# Patient Record
Sex: Male | Born: 1935 | Race: White | Hispanic: No | Smoking: Former smoker
Health system: Southern US, Community
[De-identification: ages and names within clinical notes are randomized; demographics above are authoritative.]

## PROBLEM LIST (undated history)

## (undated) DIAGNOSIS — G609 Hereditary and idiopathic neuropathy, unspecified: Secondary | ICD-10-CM

## (undated) DIAGNOSIS — I5022 Chronic systolic (congestive) heart failure: Secondary | ICD-10-CM

## (undated) DIAGNOSIS — I779 Disorder of arteries and arterioles, unspecified: Secondary | ICD-10-CM

## (undated) DIAGNOSIS — R001 Bradycardia, unspecified: Secondary | ICD-10-CM

## (undated) DIAGNOSIS — G629 Polyneuropathy, unspecified: Secondary | ICD-10-CM

## (undated) DIAGNOSIS — I428 Other cardiomyopathies: Secondary | ICD-10-CM

## (undated) DIAGNOSIS — I10 Essential (primary) hypertension: Secondary | ICD-10-CM

## (undated) DIAGNOSIS — E785 Hyperlipidemia, unspecified: Secondary | ICD-10-CM

## (undated) DIAGNOSIS — N183 Chronic kidney disease, stage 3 unspecified: Secondary | ICD-10-CM

## (undated) DIAGNOSIS — I4819 Other persistent atrial fibrillation: Secondary | ICD-10-CM

## (undated) DIAGNOSIS — I2510 Atherosclerotic heart disease of native coronary artery without angina pectoris: Secondary | ICD-10-CM

## (undated) DIAGNOSIS — Z8619 Personal history of other infectious and parasitic diseases: Secondary | ICD-10-CM

## (undated) HISTORY — PX: OTHER SURGICAL HISTORY: SHX169

## (undated) HISTORY — DX: Personal history of other infectious and parasitic diseases: Z86.19

## (undated) HISTORY — DX: Polyneuropathy, unspecified: G62.9

## (undated) HISTORY — DX: Other persistent atrial fibrillation: I48.19

## (undated) HISTORY — DX: Chronic systolic (congestive) heart failure: I50.22

## (undated) HISTORY — DX: Bradycardia, unspecified: R00.1

## (undated) HISTORY — DX: Other cardiomyopathies: I42.8

## (undated) HISTORY — DX: Essential (primary) hypertension: I10

## (undated) HISTORY — DX: Chronic kidney disease, stage 3 unspecified: N18.30

## (undated) HISTORY — PX: CATARACT EXTRACTION: SUR2

## (undated) HISTORY — PX: CORONARY ARTERY BYPASS GRAFT: SHX141

## (undated) HISTORY — DX: Hereditary and idiopathic neuropathy, unspecified: G60.9

## (undated) HISTORY — DX: Disorder of arteries and arterioles, unspecified: I77.9

## (undated) HISTORY — DX: Atherosclerotic heart disease of native coronary artery without angina pectoris: I25.10

## (undated) HISTORY — DX: Hyperlipidemia, unspecified: E78.5

---

## 1999-09-15 HISTORY — PX: CARDIAC CATHETERIZATION: SHX172

## 1999-10-10 ENCOUNTER — Encounter: Payer: Self-pay | Admitting: Emergency Medicine

## 1999-10-10 ENCOUNTER — Inpatient Hospital Stay (HOSPITAL_COMMUNITY): Admission: EM | Admit: 1999-10-10 | Discharge: 1999-10-17 | Payer: Self-pay | Admitting: Emergency Medicine

## 1999-10-11 ENCOUNTER — Encounter: Payer: Self-pay | Admitting: Cardiology

## 1999-10-12 ENCOUNTER — Encounter: Payer: Self-pay | Admitting: Thoracic Surgery (Cardiothoracic Vascular Surgery)

## 1999-10-13 ENCOUNTER — Encounter: Payer: Self-pay | Admitting: Thoracic Surgery (Cardiothoracic Vascular Surgery)

## 1999-10-14 ENCOUNTER — Encounter: Payer: Self-pay | Admitting: Thoracic Surgery (Cardiothoracic Vascular Surgery)

## 1999-10-14 ENCOUNTER — Encounter: Payer: Self-pay | Admitting: Cardiology

## 1999-10-15 ENCOUNTER — Encounter: Payer: Self-pay | Admitting: Thoracic Surgery (Cardiothoracic Vascular Surgery)

## 2012-01-01 DIAGNOSIS — F411 Generalized anxiety disorder: Secondary | ICD-10-CM | POA: Diagnosis not present

## 2012-01-01 DIAGNOSIS — E782 Mixed hyperlipidemia: Secondary | ICD-10-CM | POA: Diagnosis not present

## 2012-01-01 DIAGNOSIS — M549 Dorsalgia, unspecified: Secondary | ICD-10-CM | POA: Diagnosis not present

## 2012-01-01 DIAGNOSIS — R5382 Chronic fatigue, unspecified: Secondary | ICD-10-CM | POA: Diagnosis not present

## 2012-01-02 ENCOUNTER — Other Ambulatory Visit: Payer: Self-pay | Admitting: Cardiology

## 2012-02-17 ENCOUNTER — Other Ambulatory Visit: Payer: Self-pay | Admitting: Cardiology

## 2012-04-02 DIAGNOSIS — I10 Essential (primary) hypertension: Secondary | ICD-10-CM | POA: Diagnosis not present

## 2012-04-02 DIAGNOSIS — M549 Dorsalgia, unspecified: Secondary | ICD-10-CM | POA: Diagnosis not present

## 2012-05-11 DIAGNOSIS — I2510 Atherosclerotic heart disease of native coronary artery without angina pectoris: Secondary | ICD-10-CM | POA: Diagnosis not present

## 2012-05-11 DIAGNOSIS — I10 Essential (primary) hypertension: Secondary | ICD-10-CM | POA: Diagnosis not present

## 2012-05-11 DIAGNOSIS — I495 Sick sinus syndrome: Secondary | ICD-10-CM | POA: Diagnosis not present

## 2012-05-11 DIAGNOSIS — E785 Hyperlipidemia, unspecified: Secondary | ICD-10-CM | POA: Diagnosis not present

## 2012-05-14 ENCOUNTER — Other Ambulatory Visit: Payer: Self-pay | Admitting: Cardiology

## 2012-05-28 ENCOUNTER — Other Ambulatory Visit: Payer: Self-pay | Admitting: Cardiology

## 2012-06-22 DIAGNOSIS — I2510 Atherosclerotic heart disease of native coronary artery without angina pectoris: Secondary | ICD-10-CM | POA: Diagnosis not present

## 2012-06-22 DIAGNOSIS — I495 Sick sinus syndrome: Secondary | ICD-10-CM | POA: Diagnosis not present

## 2012-06-22 DIAGNOSIS — E785 Hyperlipidemia, unspecified: Secondary | ICD-10-CM | POA: Diagnosis not present

## 2012-06-22 DIAGNOSIS — I10 Essential (primary) hypertension: Secondary | ICD-10-CM | POA: Diagnosis not present

## 2012-07-05 DIAGNOSIS — I10 Essential (primary) hypertension: Secondary | ICD-10-CM | POA: Diagnosis not present

## 2012-10-05 DIAGNOSIS — I10 Essential (primary) hypertension: Secondary | ICD-10-CM | POA: Diagnosis not present

## 2012-11-26 DIAGNOSIS — H04129 Dry eye syndrome of unspecified lacrimal gland: Secondary | ICD-10-CM | POA: Diagnosis not present

## 2012-11-26 DIAGNOSIS — H538 Other visual disturbances: Secondary | ICD-10-CM | POA: Diagnosis not present

## 2012-11-26 DIAGNOSIS — H26499 Other secondary cataract, unspecified eye: Secondary | ICD-10-CM | POA: Diagnosis not present

## 2012-11-26 DIAGNOSIS — H40019 Open angle with borderline findings, low risk, unspecified eye: Secondary | ICD-10-CM | POA: Diagnosis not present

## 2012-11-26 DIAGNOSIS — H353190 Nonexudative age-related macular degeneration, unspecified eye, stage unspecified: Secondary | ICD-10-CM | POA: Diagnosis not present

## 2013-01-04 DIAGNOSIS — I10 Essential (primary) hypertension: Secondary | ICD-10-CM | POA: Diagnosis not present

## 2013-01-07 DIAGNOSIS — H26499 Other secondary cataract, unspecified eye: Secondary | ICD-10-CM | POA: Diagnosis not present

## 2013-01-07 DIAGNOSIS — H538 Other visual disturbances: Secondary | ICD-10-CM | POA: Diagnosis not present

## 2013-02-02 DIAGNOSIS — H16229 Keratoconjunctivitis sicca, not specified as Sjogren's, unspecified eye: Secondary | ICD-10-CM | POA: Diagnosis not present

## 2013-04-04 DIAGNOSIS — Z125 Encounter for screening for malignant neoplasm of prostate: Secondary | ICD-10-CM | POA: Diagnosis not present

## 2013-04-04 DIAGNOSIS — I10 Essential (primary) hypertension: Secondary | ICD-10-CM | POA: Diagnosis not present

## 2013-04-04 DIAGNOSIS — Z0000 Encounter for general adult medical examination without abnormal findings: Secondary | ICD-10-CM | POA: Diagnosis not present

## 2013-05-26 DIAGNOSIS — H04129 Dry eye syndrome of unspecified lacrimal gland: Secondary | ICD-10-CM | POA: Diagnosis not present

## 2013-05-26 DIAGNOSIS — H40059 Ocular hypertension, unspecified eye: Secondary | ICD-10-CM | POA: Diagnosis not present

## 2013-05-26 DIAGNOSIS — H01009 Unspecified blepharitis unspecified eye, unspecified eyelid: Secondary | ICD-10-CM | POA: Diagnosis not present

## 2013-05-26 DIAGNOSIS — H40019 Open angle with borderline findings, low risk, unspecified eye: Secondary | ICD-10-CM | POA: Diagnosis not present

## 2013-06-14 DIAGNOSIS — H04129 Dry eye syndrome of unspecified lacrimal gland: Secondary | ICD-10-CM | POA: Diagnosis not present

## 2013-06-14 DIAGNOSIS — H26499 Other secondary cataract, unspecified eye: Secondary | ICD-10-CM | POA: Diagnosis not present

## 2013-06-14 DIAGNOSIS — Z961 Presence of intraocular lens: Secondary | ICD-10-CM | POA: Diagnosis not present

## 2013-06-14 DIAGNOSIS — H353190 Nonexudative age-related macular degeneration, unspecified eye, stage unspecified: Secondary | ICD-10-CM | POA: Diagnosis not present

## 2013-06-14 DIAGNOSIS — H40019 Open angle with borderline findings, low risk, unspecified eye: Secondary | ICD-10-CM | POA: Diagnosis not present

## 2013-06-22 DIAGNOSIS — I495 Sick sinus syndrome: Secondary | ICD-10-CM | POA: Diagnosis not present

## 2013-06-22 DIAGNOSIS — I2510 Atherosclerotic heart disease of native coronary artery without angina pectoris: Secondary | ICD-10-CM | POA: Diagnosis not present

## 2013-06-22 DIAGNOSIS — E785 Hyperlipidemia, unspecified: Secondary | ICD-10-CM | POA: Diagnosis not present

## 2013-06-22 DIAGNOSIS — I10 Essential (primary) hypertension: Secondary | ICD-10-CM | POA: Diagnosis not present

## 2013-07-04 DIAGNOSIS — M549 Dorsalgia, unspecified: Secondary | ICD-10-CM | POA: Diagnosis not present

## 2013-10-03 DIAGNOSIS — M549 Dorsalgia, unspecified: Secondary | ICD-10-CM | POA: Diagnosis not present

## 2013-12-26 DIAGNOSIS — H26499 Other secondary cataract, unspecified eye: Secondary | ICD-10-CM | POA: Diagnosis not present

## 2013-12-26 DIAGNOSIS — H04129 Dry eye syndrome of unspecified lacrimal gland: Secondary | ICD-10-CM | POA: Diagnosis not present

## 2013-12-26 DIAGNOSIS — H40019 Open angle with borderline findings, low risk, unspecified eye: Secondary | ICD-10-CM | POA: Diagnosis not present

## 2013-12-26 DIAGNOSIS — H01009 Unspecified blepharitis unspecified eye, unspecified eyelid: Secondary | ICD-10-CM | POA: Diagnosis not present

## 2013-12-26 DIAGNOSIS — H43819 Vitreous degeneration, unspecified eye: Secondary | ICD-10-CM | POA: Diagnosis not present

## 2013-12-26 DIAGNOSIS — D492 Neoplasm of unspecified behavior of bone, soft tissue, and skin: Secondary | ICD-10-CM | POA: Diagnosis not present

## 2013-12-26 DIAGNOSIS — Z961 Presence of intraocular lens: Secondary | ICD-10-CM | POA: Diagnosis not present

## 2013-12-26 DIAGNOSIS — H179 Unspecified corneal scar and opacity: Secondary | ICD-10-CM | POA: Diagnosis not present

## 2014-01-03 DIAGNOSIS — E782 Mixed hyperlipidemia: Secondary | ICD-10-CM | POA: Diagnosis not present

## 2014-01-03 DIAGNOSIS — I10 Essential (primary) hypertension: Secondary | ICD-10-CM | POA: Diagnosis not present

## 2014-04-03 DIAGNOSIS — E782 Mixed hyperlipidemia: Secondary | ICD-10-CM | POA: Diagnosis not present

## 2014-04-03 DIAGNOSIS — I10 Essential (primary) hypertension: Secondary | ICD-10-CM | POA: Diagnosis not present

## 2014-05-11 DIAGNOSIS — L570 Actinic keratosis: Secondary | ICD-10-CM | POA: Diagnosis not present

## 2014-06-13 DIAGNOSIS — H04209 Unspecified epiphora, unspecified lacrimal gland: Secondary | ICD-10-CM | POA: Diagnosis not present

## 2014-06-13 DIAGNOSIS — H40019 Open angle with borderline findings, low risk, unspecified eye: Secondary | ICD-10-CM | POA: Diagnosis not present

## 2014-06-20 DIAGNOSIS — I495 Sick sinus syndrome: Secondary | ICD-10-CM | POA: Diagnosis not present

## 2014-06-20 DIAGNOSIS — I2510 Atherosclerotic heart disease of native coronary artery without angina pectoris: Secondary | ICD-10-CM | POA: Diagnosis not present

## 2014-06-20 DIAGNOSIS — E785 Hyperlipidemia, unspecified: Secondary | ICD-10-CM | POA: Diagnosis not present

## 2014-06-20 DIAGNOSIS — I10 Essential (primary) hypertension: Secondary | ICD-10-CM | POA: Diagnosis not present

## 2014-07-03 DIAGNOSIS — Z0000 Encounter for general adult medical examination without abnormal findings: Secondary | ICD-10-CM | POA: Diagnosis not present

## 2014-07-03 DIAGNOSIS — I10 Essential (primary) hypertension: Secondary | ICD-10-CM | POA: Diagnosis not present

## 2014-07-11 DIAGNOSIS — H04129 Dry eye syndrome of unspecified lacrimal gland: Secondary | ICD-10-CM | POA: Diagnosis not present

## 2014-07-11 DIAGNOSIS — H04209 Unspecified epiphora, unspecified lacrimal gland: Secondary | ICD-10-CM | POA: Diagnosis not present

## 2014-09-18 DIAGNOSIS — I10 Essential (primary) hypertension: Secondary | ICD-10-CM | POA: Diagnosis not present

## 2014-10-19 DIAGNOSIS — G7119 Other specified myotonic disorders: Secondary | ICD-10-CM | POA: Diagnosis not present

## 2014-10-19 DIAGNOSIS — Z7982 Long term (current) use of aspirin: Secondary | ICD-10-CM | POA: Diagnosis not present

## 2014-10-19 DIAGNOSIS — E876 Hypokalemia: Secondary | ICD-10-CM | POA: Diagnosis not present

## 2014-10-19 DIAGNOSIS — Z23 Encounter for immunization: Secondary | ICD-10-CM | POA: Diagnosis not present

## 2014-10-19 DIAGNOSIS — R404 Transient alteration of awareness: Secondary | ICD-10-CM | POA: Diagnosis not present

## 2014-10-19 DIAGNOSIS — J45909 Unspecified asthma, uncomplicated: Secondary | ICD-10-CM | POA: Diagnosis not present

## 2014-10-19 DIAGNOSIS — Z79899 Other long term (current) drug therapy: Secondary | ICD-10-CM | POA: Diagnosis not present

## 2014-10-19 DIAGNOSIS — G8929 Other chronic pain: Secondary | ICD-10-CM | POA: Diagnosis not present

## 2014-10-19 DIAGNOSIS — R531 Weakness: Secondary | ICD-10-CM | POA: Diagnosis not present

## 2014-10-19 DIAGNOSIS — R509 Fever, unspecified: Secondary | ICD-10-CM | POA: Diagnosis not present

## 2014-10-19 DIAGNOSIS — M545 Low back pain: Secondary | ICD-10-CM | POA: Diagnosis not present

## 2014-10-19 DIAGNOSIS — I25810 Atherosclerosis of coronary artery bypass graft(s) without angina pectoris: Secondary | ICD-10-CM | POA: Diagnosis not present

## 2014-10-19 DIAGNOSIS — I10 Essential (primary) hypertension: Secondary | ICD-10-CM | POA: Diagnosis not present

## 2014-10-19 DIAGNOSIS — I2510 Atherosclerotic heart disease of native coronary artery without angina pectoris: Secondary | ICD-10-CM | POA: Diagnosis not present

## 2014-10-19 DIAGNOSIS — R112 Nausea with vomiting, unspecified: Secondary | ICD-10-CM | POA: Diagnosis not present

## 2014-10-19 DIAGNOSIS — R110 Nausea: Secondary | ICD-10-CM | POA: Diagnosis not present

## 2014-10-19 DIAGNOSIS — E785 Hyperlipidemia, unspecified: Secondary | ICD-10-CM | POA: Diagnosis not present

## 2014-10-20 DIAGNOSIS — I2510 Atherosclerotic heart disease of native coronary artery without angina pectoris: Secondary | ICD-10-CM | POA: Diagnosis not present

## 2014-10-20 DIAGNOSIS — G7119 Other specified myotonic disorders: Secondary | ICD-10-CM | POA: Diagnosis not present

## 2014-10-20 DIAGNOSIS — E876 Hypokalemia: Secondary | ICD-10-CM | POA: Diagnosis not present

## 2014-10-20 DIAGNOSIS — E785 Hyperlipidemia, unspecified: Secondary | ICD-10-CM | POA: Diagnosis not present

## 2014-10-20 DIAGNOSIS — R509 Fever, unspecified: Secondary | ICD-10-CM | POA: Diagnosis not present

## 2014-10-27 DIAGNOSIS — K529 Noninfective gastroenteritis and colitis, unspecified: Secondary | ICD-10-CM | POA: Diagnosis not present

## 2014-12-19 DIAGNOSIS — M545 Low back pain: Secondary | ICD-10-CM | POA: Diagnosis not present

## 2015-03-20 DIAGNOSIS — M545 Low back pain: Secondary | ICD-10-CM | POA: Diagnosis not present

## 2015-03-20 DIAGNOSIS — Z131 Encounter for screening for diabetes mellitus: Secondary | ICD-10-CM | POA: Diagnosis not present

## 2015-03-20 DIAGNOSIS — I10 Essential (primary) hypertension: Secondary | ICD-10-CM | POA: Diagnosis not present

## 2015-03-26 DIAGNOSIS — Z961 Presence of intraocular lens: Secondary | ICD-10-CM | POA: Diagnosis not present

## 2015-03-26 DIAGNOSIS — H40013 Open angle with borderline findings, low risk, bilateral: Secondary | ICD-10-CM | POA: Diagnosis not present

## 2015-03-26 DIAGNOSIS — H26492 Other secondary cataract, left eye: Secondary | ICD-10-CM | POA: Diagnosis not present

## 2015-03-26 DIAGNOSIS — H3531 Nonexudative age-related macular degeneration: Secondary | ICD-10-CM | POA: Diagnosis not present

## 2015-06-07 DIAGNOSIS — H40013 Open angle with borderline findings, low risk, bilateral: Secondary | ICD-10-CM | POA: Diagnosis not present

## 2015-06-25 DIAGNOSIS — M545 Low back pain: Secondary | ICD-10-CM | POA: Diagnosis not present

## 2015-06-25 DIAGNOSIS — I10 Essential (primary) hypertension: Secondary | ICD-10-CM | POA: Diagnosis not present

## 2015-06-26 DIAGNOSIS — E785 Hyperlipidemia, unspecified: Secondary | ICD-10-CM | POA: Diagnosis not present

## 2015-06-26 DIAGNOSIS — I10 Essential (primary) hypertension: Secondary | ICD-10-CM | POA: Diagnosis not present

## 2015-06-26 DIAGNOSIS — I2510 Atherosclerotic heart disease of native coronary artery without angina pectoris: Secondary | ICD-10-CM | POA: Diagnosis not present

## 2015-09-04 DIAGNOSIS — H40013 Open angle with borderline findings, low risk, bilateral: Secondary | ICD-10-CM | POA: Diagnosis not present

## 2015-09-04 DIAGNOSIS — H04203 Unspecified epiphora, bilateral lacrimal glands: Secondary | ICD-10-CM | POA: Diagnosis not present

## 2015-09-27 DIAGNOSIS — M545 Low back pain: Secondary | ICD-10-CM | POA: Diagnosis not present

## 2015-09-27 DIAGNOSIS — I10 Essential (primary) hypertension: Secondary | ICD-10-CM | POA: Diagnosis not present

## 2015-09-27 DIAGNOSIS — Z0000 Encounter for general adult medical examination without abnormal findings: Secondary | ICD-10-CM | POA: Diagnosis not present

## 2015-09-27 DIAGNOSIS — Z1389 Encounter for screening for other disorder: Secondary | ICD-10-CM | POA: Diagnosis not present

## 2015-11-22 DIAGNOSIS — H01003 Unspecified blepharitis right eye, unspecified eyelid: Secondary | ICD-10-CM | POA: Diagnosis not present

## 2015-11-22 DIAGNOSIS — H04123 Dry eye syndrome of bilateral lacrimal glands: Secondary | ICD-10-CM | POA: Diagnosis not present

## 2015-11-22 DIAGNOSIS — H40013 Open angle with borderline findings, low risk, bilateral: Secondary | ICD-10-CM | POA: Diagnosis not present

## 2015-11-22 DIAGNOSIS — H04203 Unspecified epiphora, bilateral lacrimal glands: Secondary | ICD-10-CM | POA: Diagnosis not present

## 2015-12-27 DIAGNOSIS — G603 Idiopathic progressive neuropathy: Secondary | ICD-10-CM | POA: Diagnosis not present

## 2015-12-27 DIAGNOSIS — M545 Low back pain: Secondary | ICD-10-CM | POA: Diagnosis not present

## 2015-12-27 DIAGNOSIS — I10 Essential (primary) hypertension: Secondary | ICD-10-CM | POA: Diagnosis not present

## 2016-03-26 DIAGNOSIS — I10 Essential (primary) hypertension: Secondary | ICD-10-CM | POA: Diagnosis not present

## 2016-03-26 DIAGNOSIS — M545 Low back pain: Secondary | ICD-10-CM | POA: Diagnosis not present

## 2016-03-26 DIAGNOSIS — G603 Idiopathic progressive neuropathy: Secondary | ICD-10-CM | POA: Diagnosis not present

## 2016-05-27 DIAGNOSIS — R601 Generalized edema: Secondary | ICD-10-CM | POA: Diagnosis not present

## 2016-05-27 DIAGNOSIS — M10071 Idiopathic gout, right ankle and foot: Secondary | ICD-10-CM | POA: Diagnosis not present

## 2016-06-05 DIAGNOSIS — R601 Generalized edema: Secondary | ICD-10-CM | POA: Diagnosis not present

## 2016-06-05 DIAGNOSIS — M10071 Idiopathic gout, right ankle and foot: Secondary | ICD-10-CM | POA: Diagnosis not present

## 2016-06-24 DIAGNOSIS — I10 Essential (primary) hypertension: Secondary | ICD-10-CM | POA: Diagnosis not present

## 2016-06-24 DIAGNOSIS — I2510 Atherosclerotic heart disease of native coronary artery without angina pectoris: Secondary | ICD-10-CM | POA: Diagnosis not present

## 2016-06-24 DIAGNOSIS — E785 Hyperlipidemia, unspecified: Secondary | ICD-10-CM | POA: Diagnosis not present

## 2016-06-24 DIAGNOSIS — R001 Bradycardia, unspecified: Secondary | ICD-10-CM | POA: Diagnosis not present

## 2016-06-27 DIAGNOSIS — I10 Essential (primary) hypertension: Secondary | ICD-10-CM | POA: Diagnosis not present

## 2016-06-27 DIAGNOSIS — M545 Low back pain: Secondary | ICD-10-CM | POA: Diagnosis not present

## 2016-06-27 DIAGNOSIS — G603 Idiopathic progressive neuropathy: Secondary | ICD-10-CM | POA: Diagnosis not present

## 2016-07-10 DIAGNOSIS — L02612 Cutaneous abscess of left foot: Secondary | ICD-10-CM | POA: Diagnosis not present

## 2016-07-10 DIAGNOSIS — R601 Generalized edema: Secondary | ICD-10-CM | POA: Diagnosis not present

## 2016-07-16 DIAGNOSIS — G603 Idiopathic progressive neuropathy: Secondary | ICD-10-CM | POA: Diagnosis not present

## 2016-07-16 DIAGNOSIS — M545 Low back pain: Secondary | ICD-10-CM | POA: Diagnosis not present

## 2016-07-16 DIAGNOSIS — I2510 Atherosclerotic heart disease of native coronary artery without angina pectoris: Secondary | ICD-10-CM | POA: Diagnosis not present

## 2016-07-16 DIAGNOSIS — E784 Other hyperlipidemia: Secondary | ICD-10-CM | POA: Diagnosis not present

## 2016-07-16 DIAGNOSIS — I10 Essential (primary) hypertension: Secondary | ICD-10-CM | POA: Diagnosis not present

## 2016-08-20 DIAGNOSIS — I2510 Atherosclerotic heart disease of native coronary artery without angina pectoris: Secondary | ICD-10-CM | POA: Diagnosis not present

## 2016-08-20 DIAGNOSIS — G603 Idiopathic progressive neuropathy: Secondary | ICD-10-CM | POA: Diagnosis not present

## 2016-08-20 DIAGNOSIS — M545 Low back pain: Secondary | ICD-10-CM | POA: Diagnosis not present

## 2016-08-20 DIAGNOSIS — E784 Other hyperlipidemia: Secondary | ICD-10-CM | POA: Diagnosis not present

## 2016-08-20 DIAGNOSIS — I10 Essential (primary) hypertension: Secondary | ICD-10-CM | POA: Diagnosis not present

## 2016-09-29 DIAGNOSIS — Z1389 Encounter for screening for other disorder: Secondary | ICD-10-CM | POA: Diagnosis not present

## 2016-09-29 DIAGNOSIS — Z0000 Encounter for general adult medical examination without abnormal findings: Secondary | ICD-10-CM | POA: Diagnosis not present

## 2016-09-29 DIAGNOSIS — I10 Essential (primary) hypertension: Secondary | ICD-10-CM | POA: Diagnosis not present

## 2016-09-29 DIAGNOSIS — G603 Idiopathic progressive neuropathy: Secondary | ICD-10-CM | POA: Diagnosis not present

## 2016-09-29 DIAGNOSIS — M545 Low back pain: Secondary | ICD-10-CM | POA: Diagnosis not present

## 2016-09-30 DIAGNOSIS — E784 Other hyperlipidemia: Secondary | ICD-10-CM | POA: Diagnosis not present

## 2016-09-30 DIAGNOSIS — M545 Low back pain: Secondary | ICD-10-CM | POA: Diagnosis not present

## 2016-09-30 DIAGNOSIS — I2510 Atherosclerotic heart disease of native coronary artery without angina pectoris: Secondary | ICD-10-CM | POA: Diagnosis not present

## 2016-09-30 DIAGNOSIS — I10 Essential (primary) hypertension: Secondary | ICD-10-CM | POA: Diagnosis not present

## 2016-09-30 DIAGNOSIS — G603 Idiopathic progressive neuropathy: Secondary | ICD-10-CM | POA: Diagnosis not present

## 2016-11-03 DIAGNOSIS — I10 Essential (primary) hypertension: Secondary | ICD-10-CM | POA: Diagnosis not present

## 2016-11-03 DIAGNOSIS — G603 Idiopathic progressive neuropathy: Secondary | ICD-10-CM | POA: Diagnosis not present

## 2016-11-03 DIAGNOSIS — I2510 Atherosclerotic heart disease of native coronary artery without angina pectoris: Secondary | ICD-10-CM | POA: Diagnosis not present

## 2016-11-03 DIAGNOSIS — M545 Low back pain: Secondary | ICD-10-CM | POA: Diagnosis not present

## 2016-11-03 DIAGNOSIS — E784 Other hyperlipidemia: Secondary | ICD-10-CM | POA: Diagnosis not present

## 2016-12-29 DIAGNOSIS — M545 Low back pain: Secondary | ICD-10-CM | POA: Diagnosis not present

## 2016-12-29 DIAGNOSIS — G603 Idiopathic progressive neuropathy: Secondary | ICD-10-CM | POA: Diagnosis not present

## 2016-12-29 DIAGNOSIS — I10 Essential (primary) hypertension: Secondary | ICD-10-CM | POA: Diagnosis not present

## 2016-12-29 DIAGNOSIS — Z79899 Other long term (current) drug therapy: Secondary | ICD-10-CM | POA: Diagnosis not present

## 2017-03-30 DIAGNOSIS — M545 Low back pain: Secondary | ICD-10-CM | POA: Diagnosis not present

## 2017-03-30 DIAGNOSIS — I10 Essential (primary) hypertension: Secondary | ICD-10-CM | POA: Diagnosis not present

## 2017-03-30 DIAGNOSIS — G603 Idiopathic progressive neuropathy: Secondary | ICD-10-CM | POA: Diagnosis not present

## 2017-06-29 DIAGNOSIS — M545 Low back pain: Secondary | ICD-10-CM | POA: Diagnosis not present

## 2017-06-29 DIAGNOSIS — I10 Essential (primary) hypertension: Secondary | ICD-10-CM | POA: Diagnosis not present

## 2017-06-30 DIAGNOSIS — I2510 Atherosclerotic heart disease of native coronary artery without angina pectoris: Secondary | ICD-10-CM | POA: Diagnosis not present

## 2017-06-30 DIAGNOSIS — E785 Hyperlipidemia, unspecified: Secondary | ICD-10-CM | POA: Diagnosis not present

## 2017-06-30 DIAGNOSIS — I10 Essential (primary) hypertension: Secondary | ICD-10-CM | POA: Diagnosis not present

## 2017-06-30 DIAGNOSIS — R001 Bradycardia, unspecified: Secondary | ICD-10-CM | POA: Diagnosis not present

## 2017-09-28 DIAGNOSIS — M545 Low back pain: Secondary | ICD-10-CM | POA: Diagnosis not present

## 2017-09-28 DIAGNOSIS — G629 Polyneuropathy, unspecified: Secondary | ICD-10-CM | POA: Diagnosis not present

## 2017-09-28 DIAGNOSIS — I10 Essential (primary) hypertension: Secondary | ICD-10-CM | POA: Diagnosis not present

## 2017-12-31 DIAGNOSIS — G629 Polyneuropathy, unspecified: Secondary | ICD-10-CM | POA: Diagnosis not present

## 2017-12-31 DIAGNOSIS — I10 Essential (primary) hypertension: Secondary | ICD-10-CM | POA: Diagnosis not present

## 2017-12-31 DIAGNOSIS — M545 Low back pain: Secondary | ICD-10-CM | POA: Diagnosis not present

## 2018-01-21 DIAGNOSIS — Z0000 Encounter for general adult medical examination without abnormal findings: Secondary | ICD-10-CM | POA: Diagnosis not present

## 2018-01-21 DIAGNOSIS — R7303 Prediabetes: Secondary | ICD-10-CM | POA: Diagnosis not present

## 2018-01-21 DIAGNOSIS — Z1389 Encounter for screening for other disorder: Secondary | ICD-10-CM | POA: Diagnosis not present

## 2018-03-23 DIAGNOSIS — H43813 Vitreous degeneration, bilateral: Secondary | ICD-10-CM | POA: Diagnosis not present

## 2018-04-26 DIAGNOSIS — M549 Dorsalgia, unspecified: Secondary | ICD-10-CM | POA: Diagnosis not present

## 2018-04-26 DIAGNOSIS — M1049 Other secondary gout, multiple sites: Secondary | ICD-10-CM | POA: Diagnosis not present

## 2018-07-02 DIAGNOSIS — R001 Bradycardia, unspecified: Secondary | ICD-10-CM | POA: Diagnosis not present

## 2018-07-02 DIAGNOSIS — I10 Essential (primary) hypertension: Secondary | ICD-10-CM | POA: Diagnosis not present

## 2018-07-02 DIAGNOSIS — I2510 Atherosclerotic heart disease of native coronary artery without angina pectoris: Secondary | ICD-10-CM | POA: Diagnosis not present

## 2018-07-02 DIAGNOSIS — E785 Hyperlipidemia, unspecified: Secondary | ICD-10-CM | POA: Diagnosis not present

## 2018-08-05 DIAGNOSIS — G603 Idiopathic progressive neuropathy: Secondary | ICD-10-CM | POA: Diagnosis not present

## 2018-08-05 DIAGNOSIS — E782 Mixed hyperlipidemia: Secondary | ICD-10-CM | POA: Diagnosis not present

## 2018-08-05 DIAGNOSIS — M10262 Drug-induced gout, left knee: Secondary | ICD-10-CM | POA: Diagnosis not present

## 2018-08-05 DIAGNOSIS — M549 Dorsalgia, unspecified: Secondary | ICD-10-CM | POA: Diagnosis not present

## 2018-08-05 DIAGNOSIS — M1049 Other secondary gout, multiple sites: Secondary | ICD-10-CM | POA: Diagnosis not present

## 2018-08-05 DIAGNOSIS — M545 Low back pain: Secondary | ICD-10-CM | POA: Diagnosis not present

## 2018-09-23 DIAGNOSIS — H02831 Dermatochalasis of right upper eyelid: Secondary | ICD-10-CM | POA: Diagnosis not present

## 2018-09-23 DIAGNOSIS — H353132 Nonexudative age-related macular degeneration, bilateral, intermediate dry stage: Secondary | ICD-10-CM | POA: Diagnosis not present

## 2018-09-23 DIAGNOSIS — H43813 Vitreous degeneration, bilateral: Secondary | ICD-10-CM | POA: Diagnosis not present

## 2018-09-23 DIAGNOSIS — H02834 Dermatochalasis of left upper eyelid: Secondary | ICD-10-CM | POA: Diagnosis not present

## 2018-09-23 DIAGNOSIS — H353112 Nonexudative age-related macular degeneration, right eye, intermediate dry stage: Secondary | ICD-10-CM | POA: Diagnosis not present

## 2018-09-23 DIAGNOSIS — H31012 Macula scars of posterior pole (postinflammatory) (post-traumatic), left eye: Secondary | ICD-10-CM | POA: Diagnosis not present

## 2018-09-23 DIAGNOSIS — H353122 Nonexudative age-related macular degeneration, left eye, intermediate dry stage: Secondary | ICD-10-CM | POA: Diagnosis not present

## 2018-10-06 ENCOUNTER — Other Ambulatory Visit: Payer: Self-pay | Admitting: Emergency Medicine

## 2018-10-06 MED ORDER — SIMVASTATIN 80 MG PO TABS: 80.0000 mg | ORAL_TABLET | Freq: Every day | ORAL | 0 refills | Status: DC

## 2018-10-06 NOTE — Telephone Encounter (Signed)
Dr. Wynonia Lawman patient. Simvastatin 80 mg daily refilled per Dr. Agustin Cree.

## 2018-11-03 DIAGNOSIS — G603 Idiopathic progressive neuropathy: Secondary | ICD-10-CM | POA: Diagnosis not present

## 2018-11-03 DIAGNOSIS — M545 Low back pain: Secondary | ICD-10-CM | POA: Diagnosis not present

## 2018-11-03 DIAGNOSIS — M10262 Drug-induced gout, left knee: Secondary | ICD-10-CM | POA: Diagnosis not present

## 2019-01-10 ENCOUNTER — Other Ambulatory Visit: Payer: Self-pay | Admitting: Cardiology

## 2019-01-10 NOTE — Telephone Encounter (Signed)
Refill for simvastatin sent to [facility] as requested per Dr. Agustin Cree. Patient is not due for follow up until July 2020.

## 2019-01-26 ENCOUNTER — Other Ambulatory Visit: Payer: Self-pay

## 2019-01-26 MED ORDER — HYDROCHLOROTHIAZIDE 12.5 MG PO CAPS: 12.5000 mg | ORAL_CAPSULE | Freq: Every day | ORAL | 1 refills | Status: DC

## 2019-01-26 NOTE — Telephone Encounter (Signed)
Rx sent to [facility] as requested for HCTZ 12.5 mg one capsule daily #90 with 1 refill.  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  Will put recall in for July 2020.

## 2019-02-02 DIAGNOSIS — M10262 Drug-induced gout, left knee: Secondary | ICD-10-CM | POA: Diagnosis not present

## 2019-02-02 DIAGNOSIS — Z0000 Encounter for general adult medical examination without abnormal findings: Secondary | ICD-10-CM | POA: Diagnosis not present

## 2019-02-02 DIAGNOSIS — G603 Idiopathic progressive neuropathy: Secondary | ICD-10-CM | POA: Diagnosis not present

## 2019-02-02 DIAGNOSIS — Z1389 Encounter for screening for other disorder: Secondary | ICD-10-CM | POA: Diagnosis not present

## 2019-02-02 DIAGNOSIS — M545 Low back pain: Secondary | ICD-10-CM | POA: Diagnosis not present

## 2019-05-03 DIAGNOSIS — M545 Low back pain: Secondary | ICD-10-CM | POA: Diagnosis not present

## 2019-05-03 DIAGNOSIS — M10262 Drug-induced gout, left knee: Secondary | ICD-10-CM | POA: Diagnosis not present

## 2019-05-03 DIAGNOSIS — G603 Idiopathic progressive neuropathy: Secondary | ICD-10-CM | POA: Diagnosis not present

## 2019-05-20 ENCOUNTER — Telehealth: Payer: Self-pay | Admitting: *Deleted

## 2019-05-20 NOTE — Telephone Encounter (Signed)
LMOM for patient to call the office to discuss upcoming 05/25/19 appointment.

## 2019-05-23 ENCOUNTER — Telehealth: Payer: Self-pay | Admitting: *Deleted

## 2019-05-23 NOTE — Telephone Encounter (Signed)

## 2019-05-24 NOTE — Progress Notes (Signed)
Virtual Visit via Telephone Note   This visit type was conducted due to national recommendations for restrictions regarding the COVID-19 Pandemic (e.g. social distancing) in an effort to limit this patient's exposure and mitigate transmission in our community.  Due to his co-morbid illnesses, this patient is at least at moderate risk for complications without adequate follow up.  This format is felt to be most appropriate for this patient at this time.  The patient did not have access to video technology/had technical difficulties with video requiring transitioning to audio format only (telephone).  All issues noted in this document were discussed and addressed.  No physical exam could be performed with this format.  Please refer to the patient's chart for his  consent to telehealth for [facility].   Date:  05/25/2019   ID:  Kevin Collins, DOB Jul 11, 1930, MRN [id number]  Patient Location: Home Provider Location: Home  PCP:  Neale Burly, MD  Cardiologist:  Minus Breeding, MD  Electrophysiologist:  None   Evaluation Performed:  New Patient Evaluation  Chief Complaint:  CAD  History of Present Illness:    Kevin Collins is a 85 y.o. male with CAD.  He is new to me.  He has had CAD with CABG in 10/1999 with Dr. Roxan Hockey (L - LAD, SVG - Circ OM, SVG - Diag, SVG - PD/PL)  He had a nuclear 06/2009 with an EF of 48%.   He is active and doing some yard work.  He checks on his brother in law who is my patient.  The patient denies any new symptoms such as chest discomfort, neck or arm discomfort. There has been no new shortness of breath, PND or orthopnea. There have been no reported palpitations, presyncope or syncope.   He is limited by neuropathy   The patient does not have symptoms concerning for COVID-19 infection (fever, chills, cough, or new shortness of breath).    Past Medical History:  Diagnosis Date  . Bradycardia, unspecified   . CAD (coronary artery disease)    NATIVE WITHOUT  ANGINA  . Hereditary and idiopathic neuropathy, unspecified   . History of shingles   . Hyperlipidemia   . Hypertension    ESSENTIAL PRIMARY  . Peripheral neuropathy    Past Surgical History:  Procedure Laterality Date  . CARDIAC CATHETERIZATION Left 09/1999   NORMAL LEFT MAIN, OCCLUDED LAD, OCCLUDED MID CFX, OCCLUDED PROXIMAL RCA, 60% STENOSIS PROXIMAL DIAG 1, RIGHT TO LEFT COLLATERAL, LEFT TO RIGHT COLLATERAL; LVEF OF 48% DOCUMENTED VIA NUCLEAR STUDY ON 07/12/2009.  Marland Kitchen CATARACT EXTRACTION    . CORONARY ARTERY BYPASS GRAFT     w/ LIMA TO LAD, SVG TO dx, SVG TO OM, SVG TO AM-PD-PL 10/12/99 [facility]  . REMOVAL OF ANKLE PLATE       Current Meds  Medication Sig  . ezetimibe (ZETIA) 10 MG tablet Take 10 mg by mouth daily.  Marland Kitchen gabapentin (NEURONTIN) 800 MG tablet Take 800 mg by mouth 3 (three) times daily.  . hydrochlorothiazide (MICROZIDE) 12.5 MG capsule Take 1 capsule (12.5 mg total) by mouth daily.  Marland Kitchen lisinopril (ZESTRIL) 40 MG tablet Take 40 mg by mouth daily.  . Multiple Vitamin (MULTIVITAMIN) tablet Take 1 tablet by mouth daily.  . Multiple Vitamins-Minerals (PRESERVISION AREDS) CAPS Take 1 capsule by mouth 2 (two) times daily.  Marland Kitchen oxyCODONE-acetaminophen (PERCOCET) 7.5-325 MG tablet Take 1 tablet by mouth every 4 (four) hours as needed for severe pain.  . simvastatin (ZOCOR) 80 MG tablet TAKE 1  TABLET DAILY     Allergies:   Patient has no known allergies.   Social History   Tobacco Use  . Smoking status: Former Research scientist (life sciences)  . Smokeless tobacco: Never Used  Substance Use Topics  . Alcohol use: Never    Frequency: Never  . Drug use: Never     Family Hx: The patient's family history includes CVA in his father; Other in his mother.  ROS:   Please see the history of present illness.   Positive for neuropathy  Otherwise as stated in the HPI and negative for all other systems.   Prior CV studies:   The following studies were reviewed today:  Dr. Thurman Coyer notes   Labs/Other Tests and Data Reviewed:    EKG:  No ECG reviewed.  Recent Labs: No results found for requested labs within last 8760 hours.   Recent Lipid Panel No results found for: CHOL, TRIG, HDL, CHOLHDL, LDLCALC, LDLDIRECT  Wt Readings from Last 3 Encounters:  05/25/19 174 lb (78.9 kg)     Objective:    Vital Signs:  BP (!) 117/55   Pulse (!) 50   Ht 5\' 9"  (1.753 m)   Wt 174 lb (78.9 kg)   BMI 25.70 kg/m    VITAL SIGNS:  reviewed  ASSESSMENT & PLAN:    CAD:  The patient has no new sypmtoms.  No further cardiovascular testing is indicated.  We will continue with aggressive risk reduction and meds as listed.  HTN:    The blood pressure is at target. No change in medications is indicated. We will continue with therapeutic lifestyle changes (TLC).  DYSLIPIDEMIA:    LDL 41 and HDL 44 last readings that I have.  Continue current therapy.   COVID-19 Education: The signs and symptoms of COVID-19 were discussed with the patient and how to seek care for testing (follow up with PCP or arrange E-visit).  The importance of social distancing was discussed today.  Time:   Today, I have spent 25 minutes with the patient with telehealth technology discussing the above problems.     Medication Adjustments/Labs and Tests Ordered: Current medicines are reviewed at length with the patient today.  Concerns regarding medicines are outlined above.   Tests Ordered: No orders of the defined types were placed in this encounter.   Medication Changes: No orders of the defined types were placed in this encounter.   Disposition:  Follow up with me in the office in one year.   Signed, Minus Breeding, MD  05/25/2019 11:49 AM    [facility]

## 2019-05-25 ENCOUNTER — Encounter: Payer: Self-pay | Admitting: Cardiology

## 2019-05-25 ENCOUNTER — Other Ambulatory Visit: Payer: Self-pay

## 2019-05-25 ENCOUNTER — Telehealth (INDEPENDENT_AMBULATORY_CARE_PROVIDER_SITE_OTHER): Payer: Medicare Other | Admitting: Cardiology

## 2019-05-25 VITALS — BP 117/55 | HR 50 | Ht 69.0 in | Wt 174.0 lb

## 2019-05-25 DIAGNOSIS — E785 Hyperlipidemia, unspecified: Secondary | ICD-10-CM

## 2019-05-25 DIAGNOSIS — Z7189 Other specified counseling: Secondary | ICD-10-CM

## 2019-05-25 DIAGNOSIS — I2510 Atherosclerotic heart disease of native coronary artery without angina pectoris: Secondary | ICD-10-CM | POA: Diagnosis not present

## 2019-05-25 DIAGNOSIS — I10 Essential (primary) hypertension: Secondary | ICD-10-CM

## 2019-05-25 MED ORDER — LISINOPRIL 40 MG PO TABS: 40.0000 mg | ORAL_TABLET | Freq: Every day | ORAL | 3 refills | Status: DC

## 2019-05-25 MED ORDER — HYDROCHLOROTHIAZIDE 12.5 MG PO CAPS: 12.5000 mg | ORAL_CAPSULE | Freq: Every day | ORAL | 3 refills | Status: DC

## 2019-05-25 MED ORDER — SIMVASTATIN 80 MG PO TABS: 80.0000 mg | ORAL_TABLET | Freq: Every day | ORAL | 3 refills | Status: DC

## 2019-05-25 MED ORDER — EZETIMIBE 10 MG PO TABS: 10.0000 mg | ORAL_TABLET | Freq: Every day | ORAL | 3 refills | Status: DC

## 2019-05-25 NOTE — Patient Instructions (Signed)
Medication Instructions:  Please continue your current medications.  If you need a refill on your cardiac medications before your next appointment, please call your pharmacy.   Follow-Up: . Follow up in 1 year with Dr. Percival Spanish in [place].  You will receive a letter in the mail 2 months before you are due.  Please call us when you receive this letter to schedule your follow up appointment.  Thank you for choosing [facility]!!

## 2019-06-28 DIAGNOSIS — G609 Hereditary and idiopathic neuropathy, unspecified: Secondary | ICD-10-CM | POA: Diagnosis not present

## 2019-06-28 DIAGNOSIS — L97511 Non-pressure chronic ulcer of other part of right foot limited to breakdown of skin: Secondary | ICD-10-CM | POA: Diagnosis not present

## 2019-07-12 DIAGNOSIS — L97511 Non-pressure chronic ulcer of other part of right foot limited to breakdown of skin: Secondary | ICD-10-CM | POA: Diagnosis not present

## 2019-07-14 ENCOUNTER — Other Ambulatory Visit: Payer: Self-pay

## 2019-08-02 DIAGNOSIS — M545 Low back pain: Secondary | ICD-10-CM | POA: Diagnosis not present

## 2019-08-02 DIAGNOSIS — M10262 Drug-induced gout, left knee: Secondary | ICD-10-CM | POA: Diagnosis not present

## 2019-08-02 DIAGNOSIS — Z79899 Other long term (current) drug therapy: Secondary | ICD-10-CM | POA: Diagnosis not present

## 2019-08-02 DIAGNOSIS — G603 Idiopathic progressive neuropathy: Secondary | ICD-10-CM | POA: Diagnosis not present

## 2019-08-25 ENCOUNTER — Telehealth: Payer: Self-pay | Admitting: Cardiology

## 2019-08-25 NOTE — Telephone Encounter (Signed)
Medication change is not indicated at this time.  Follow previous recommendations: Keep legs elevated, use compression stockings, and decrease sodium intake is the best approach at this time.  Increasing HCTZ may cause drop in BP (currently normal) and variation of 1lb if weight are normal as well.

## 2019-08-25 NOTE — Telephone Encounter (Signed)
Called patient, he states he noticed the swelling in his legs/ankles for the past three days.  Patient denies chest pain, or shortness of breath.  Patient denies changes in medications. Over 10 days, he states he has increased 1lb to 2lb.  Patient denies changes in diet, no increased salt and sodium.  Patient states he is checking blood pressure at home- 123/65 at PCP office last week. He does elevate his feet at night, patient denies compression stockings.  Patient advised to monitor salt intake- and to get compression stockings, continue to elevate legs.   Please advise if any changes in medication should occur? Thanks!

## 2019-08-25 NOTE — Telephone Encounter (Signed)
Called patient and advised of message from PharmD. Patient verbalized understanding.

## 2019-08-25 NOTE — Telephone Encounter (Signed)
° ° ° ° °  1) How much weight have you gained and in what time span?  2) If swelling, where is the swelling located? Legs,ankles   3) Are you currently taking a fluid pill? Patient wants to know if he needs to increase hydrochlorothiazide (MICROZIDE) 12.5 MG capsule  4) Are you currently SOB? No  5) Do you have a log of your daily weights (if so, list)? 186 today, 185  Have you gained 3 pounds in a day or 5 pounds in a week?  6) Have you traveled recently? no

## 2019-08-31 ENCOUNTER — Encounter: Payer: Self-pay | Admitting: Cardiology

## 2019-08-31 ENCOUNTER — Ambulatory Visit (INDEPENDENT_AMBULATORY_CARE_PROVIDER_SITE_OTHER): Payer: Medicare Other | Admitting: Cardiology

## 2019-08-31 ENCOUNTER — Other Ambulatory Visit: Payer: Self-pay

## 2019-08-31 VITALS — BP 128/62 | HR 44 | Ht 70.0 in | Wt 190.0 lb

## 2019-08-31 DIAGNOSIS — I4891 Unspecified atrial fibrillation: Secondary | ICD-10-CM | POA: Diagnosis not present

## 2019-08-31 DIAGNOSIS — Z79899 Other long term (current) drug therapy: Secondary | ICD-10-CM | POA: Insufficient documentation

## 2019-08-31 DIAGNOSIS — I509 Heart failure, unspecified: Secondary | ICD-10-CM | POA: Diagnosis not present

## 2019-08-31 DIAGNOSIS — I2510 Atherosclerotic heart disease of native coronary artery without angina pectoris: Secondary | ICD-10-CM

## 2019-08-31 DIAGNOSIS — I10 Essential (primary) hypertension: Secondary | ICD-10-CM | POA: Diagnosis not present

## 2019-08-31 MED ORDER — FUROSEMIDE 40 MG PO TABS: 40.0000 mg | ORAL_TABLET | Freq: Every day | ORAL | 6 refills | Status: DC

## 2019-08-31 MED ORDER — POTASSIUM CHLORIDE CRYS ER 20 MEQ PO TBCR: 20.0000 meq | EXTENDED_RELEASE_TABLET | Freq: Every day | ORAL | 6 refills | Status: DC

## 2019-08-31 NOTE — Patient Instructions (Addendum)
Medication Instructions:  Please start Furosemide 40 mg a day. Start potassium chloride 20 MEQ a day. Continue all other medications as listed.  If you need a refill on your cardiac medications before your next appointment, please call your pharmacy.   Lab work: Please have blood work on Friday at the [facility]. (CBC, CMP, TSH, Pro-BNP stat) If you have labs (blood work) drawn today and your tests are completely normal, you will receive your results only by: Marland Kitchen MyChart Message (if you have MyChart) OR . A paper copy in the mail If you have any lab test that is abnormal or we need to change your treatment, we will call you to review the results.  Testing/Procedures: Your physician has requested that you have an echocardiogram on Friday morning at the [place], [place] location as scheduled. Echocardiography is a painless test that uses sound waves to create images of your heart. It provides your doctor with information about the size and shape of your heart and how well your heart's chambers and valves are working. This procedure takes approximately one hour. There are no restrictions for this procedure.  Follow-Up: Follow up with Dr Percival Spanish at the [facility] office on Friday as scheduled.  Thank you for choosing [facility]!!     Heart Failure, Self Care Heart failure is a serious condition. This sheet explains things you need to do to take care of yourself at home. To help you stay as healthy as possible, you may be asked to change your diet, take certain medicines, and make other changes in your life. Your doctor may also give you more specific instructions. If you have problems or questions, call your doctor. What are the risks? Having heart failure makes it more likely for you to have some problems. These problems can get worse if you do not take good care of yourself. Problems may include:  Blood clotting problems. This may cause a stroke.   Damage to the kidneys, liver, or lungs.  Abnormal heart rhythms. Supplies needed:  Scale for weighing yourself.  Blood pressure monitor.  Notebook.  Medicines. How to care for yourself when you have heart failure Medicines Take over-the-counter and prescription medicines only as told by your doctor. Take your medicines every day.  Do not stop taking your medicine unless your doctor tells you to do so.  Do not skip any medicines.  Get your prescriptions refilled before you run out of medicine. This is important. Eating and drinking   Eat heart-healthy foods. Talk with a diet specialist (dietitian) to create an eating plan.  Choose foods that: ? Have no trans fat. ? Are low in saturated fat and cholesterol.  Choose healthy foods, such as: ? Fresh or frozen fruits and vegetables. ? Fish. ? Low-fat (lean) meats. ? Legumes, such as beans, peas, and lentils. ? Fat-free or low-fat dairy products. ? Whole-grain foods. ? High-fiber foods.  Limit salt (sodium) if told by your doctor. Ask your diet specialist to tell you which seasonings are healthy for your heart.  Cook in healthy ways instead of frying. Healthy ways of cooking include roasting, grilling, broiling, baking, poaching, steaming, and stir-frying.  Limit how much fluid you drink, if told by your doctor. Alcohol use  Do not drink alcohol if: ? Your doctor tells you not to drink. ? Your heart was damaged by alcohol, or you have very bad heart failure. ? You are pregnant, may be pregnant, or are planning  to become pregnant.  If you drink alcohol: ? Limit how much you use to:  0-1 drink a day for women.  0-2 drinks a day for men. ? Be aware of how much alcohol is in your drink. In the [place] one drink equals one 12 oz bottle of beer (355 mL), one 5 oz glass of wine (148 mL), or one 1 oz glass of hard liquor (44 mL). Lifestyle   Do not use any products that contain nicotine or tobacco, such as cigarettes,  e-cigarettes, and chewing tobacco. If you need help quitting, ask your doctor. ? Do not use nicotine gum or patches before talking to your doctor.  Do not use illegal drugs.  Lose weight if told by your doctor.  Do physical activity if told by your doctor. Talk to your doctor before you begin an exercise if: ? You are an older adult. ? You have very bad heart failure.  Learn to manage stress. If you need help, ask your doctor.  Get rehab (rehabilitation) to help you stay independent and to help with your quality of life.  Plan time to rest when you get tired. Check weight and blood pressure   Weigh yourself every day. This will help you to know if fluid is building up in your body. ? Weigh yourself every morning after you pee (urinate) and before you eat breakfast. ? Wear the same amount of clothing each time. ? Write down your daily weight. Give your record to your doctor.  Check and write down your blood pressure as told by your doctor.  Check your pulse as told by your doctor. Dealing with very hot and very cold weather  If it is very hot: ? Avoid activities that take a lot of energy. ? Use air conditioning or fans, or find a cooler place. ? Avoid caffeine and alcohol. ? Wear clothing that is loose-fitting, lightweight, and light-colored.  If it is very cold: ? Avoid activities that take a lot of energy. ? Layer your clothes. ? Wear mittens or gloves, a hat, and a scarf when you go outside. ? Avoid alcohol. Follow these instructions at home:  Stay up to date with shots (vaccines). Get pneumococcal and flu (influenza) shots.  Keep all follow-up visits as told by your doctor. This is important. Contact a doctor if:  You gain weight quickly.  You have increasing shortness of breath.  You cannot do your normal activities.  You get tired easily.  You cough a lot.  You don't feel like eating or feel like you may vomit (nauseous).  You become puffy (swell) in  your hands, feet, ankles, or belly (abdomen).  You cannot sleep well because it is hard to breathe.  You feel like your heart is beating fast (palpitations).  You get dizzy when you stand up. Get help right away if:  You have trouble breathing.  You or someone else notices a change in your behavior, such as having trouble staying awake.  You have chest pain or discomfort.  You pass out (faint). These symptoms may be an emergency. Do not wait to see if the symptoms will go away. Get medical help right away. Call your local emergency services (911 in the [place] Do not drive yourself to the hospital. Summary  Heart failure is a serious condition. To care for yourself, you may have to change your diet, take medicines, and make other lifestyle changes.  Take your medicines every day. Do not stop taking them unless  your doctor tells you to do so.  Eat heart-healthy foods, such as fresh or frozen fruits and vegetables, fish, lean meats, legumes, fat-free or low-fat dairy products, and whole-grain or high-fiber foods.  Ask your doctor if you can drink alcohol. You may have to stop alcohol use if you have very bad heart failure.  Contact your doctor if you gain weight quickly or feel that your heart is beating too fast. Get help right away if you pass out, or have chest pain or trouble breathing. This information is not intended to replace advice given to you by your health care provider. Make sure you discuss any questions you have with your health care provider. Document Released: 03/16/2019 Document Revised: 03/15/2019 Document Reviewed: 03/16/2019 Elsevier Patient Education  2020 [employer].

## 2019-08-31 NOTE — Progress Notes (Signed)
Cardiology Office Note   Date:  08/31/2019   ID:  Kevin Collins, DOB Apr 23, 1930, MRN [id number]  PCP:  Neale Burly, MD  Cardiologist:   Minus Breeding, MD   No chief complaint on file.     History of Present Illness: Kevin Collins is a 85 y.o. male who presents for follow up of CAD with CABG in 10/1999 with Dr. Roxan Hockey (L - LAD, SVG - Circ OM, SVG - Diag, SVG - PD/PL).   He had a nuclear 06/2009 with an EF of 48%.    He returns for follow-up.  He actually called earlier this week.  He was having some increased ankle swelling left greater than right.  He has been doing some weight.  He now has some scrotal swelling as well.  He thinks he is gained about 16 pounds.  He is been a little more short of breath but not overtly so.  He is not describing PND or orthopnea.  He has been a little bit weaker but he has not noticed any irregular heart rate and has not had any presyncope or syncope.  He has not had any chest pressure, neck or arm discomfort.  He has had no cough fevers or chills.   Past Medical History:  Diagnosis Date  . Bradycardia, unspecified   . CAD (coronary artery disease)    NATIVE WITHOUT ANGINA  . Hereditary and idiopathic neuropathy, unspecified   . History of shingles   . Hyperlipidemia   . Hypertension    ESSENTIAL PRIMARY  . Peripheral neuropathy     Past Surgical History:  Procedure Laterality Date  . CARDIAC CATHETERIZATION Left 09/1999   NORMAL LEFT MAIN, OCCLUDED LAD, OCCLUDED MID CFX, OCCLUDED PROXIMAL RCA, 60% STENOSIS PROXIMAL DIAG 1, RIGHT TO LEFT COLLATERAL, LEFT TO RIGHT COLLATERAL; LVEF OF 48% DOCUMENTED VIA NUCLEAR STUDY ON 07/12/2009.  Marland Kitchen CATARACT EXTRACTION    . CORONARY ARTERY BYPASS GRAFT     w/ LIMA TO LAD, SVG TO dx, SVG TO OM, SVG TO AM-PD-PL 10/12/99 [facility]  . REMOVAL OF ANKLE PLATE       Current Outpatient Medications  Medication Sig Dispense Refill  . ezetimibe (ZETIA) 10 MG tablet Take 1 tablet (10 mg total) by mouth  daily. 90 tablet 3  . gabapentin (NEURONTIN) 800 MG tablet Take 800 mg by mouth 3 (three) times daily.    . hydrochlorothiazide (MICROZIDE) 12.5 MG capsule Take 1 capsule (12.5 mg total) by mouth daily. 90 capsule 3  . lisinopril (ZESTRIL) 40 MG tablet Take 1 tablet (40 mg total) by mouth daily. 90 tablet 3  . Multiple Vitamin (MULTIVITAMIN) tablet Take 1 tablet by mouth daily.    . Multiple Vitamins-Minerals (PRESERVISION AREDS) CAPS Take 1 capsule by mouth 2 (two) times daily.    . Omega-3 Fatty Acids (FISH OIL) 1000 MG CAPS Take 1,000 mg by mouth 3 (three) times daily.    Marland Kitchen oxyCODONE-acetaminophen (PERCOCET) 7.5-325 MG tablet Take 1 tablet by mouth every 4 (four) hours as needed for severe pain.    . simvastatin (ZOCOR) 80 MG tablet Take 1 tablet (80 mg total) by mouth daily. 90 tablet 3  . furosemide (LASIX) 40 MG tablet Take 1 tablet (40 mg total) by mouth daily. 30 tablet 6  . potassium chloride SA (K-DUR) 20 MEQ tablet Take 1 tablet (20 mEq total) by mouth daily. 30 tablet 6   No current facility-administered medications for this visit.     Allergies:   Patient  has no known allergies.    Social History:  The patient  reports that he has quit smoking. He has never used smokeless tobacco. He reports that he does not drink alcohol or use drugs.   Family History:  The patient's family history includes CVA in his father; Other in his mother.    ROS:  Please see the history of present illness.   Otherwise, review of systems are positive for none.   All other systems are reviewed and negative.    PHYSICAL EXAM: VS:  BP 128/62   Pulse (!) 44   Ht 5' 10"  (1.778 m)   Wt 190 lb (86.2 kg)   BMI 27.26 kg/m  , BMI Body mass index is 27.26 kg/m. GENERAL:  Well appearing HEENT:  Pupils equal round and reactive, fundi not visualized, oral mucosa unremarkable NECK:  Positive jugular venous distention at 45 degrees, waveform within normal limits, carotid upstroke brisk and symmetric, no  bruits, no thyromegaly LYMPHATICS:  No cervical, inguinal adenopathy LUNGS:  Decreased breath sounds with crackles at the bases BACK:  No CVA tenderness CHEST:  Unremarkable HEART:  PMI not displaced or sustained,S1 and S2 within normal limits, no S3, no clicks, no rubs, no murmurs, irregular ABD:  Flat, positive bowel sounds normal in frequency in pitch, no bruits, no rebound, no guarding, no midline pulsatile mass, no hepatomegaly, no splenomegaly EXT:  2 plus pulses throughout, moderate leg edema bilateral ,  no cyanosis no clubbing SKIN:  No rashes no nodules NEURO:  Cranial nerves II through XII grossly intact, motor grossly intact throughout PSYCH:  Cognitively intact, oriented to person place and time    EKG:  EKG is ordered today. The ekg ordered today demonstrates atrial fibrillation with slow ventricular rate, poor anterior R wave progression, left axis deviation, anterior lateral infarct.  I do not have an old EKG for comparison   Recent Labs: No results found for requested labs within last 8760 hours.    Lipid Panel No results found for: CHOL, TRIG, HDL, CHOLHDL, VLDL, LDLCALC, LDLDIRECT    Wt Readings from Last 3 Encounters:  08/31/19 190 lb (86.2 kg)  05/25/19 174 lb (78.9 kg)      Other studies Reviewed: Additional studies/ records that were reviewed today include: Labs from Neale Burly, MD. Review of the above records demonstrates:  Please see elsewhere in the note.    ASSESSMENT AND PLAN:  CAD:   Patient not having any ongoing chest pain.  However, he appears to have new heart failure.  I would proceed as below.    ACUTE CHF: The patient is had a reduced ejection fraction.  He is now obviously in acute heart failure.  He is not in distress.  I historically given 40 mg of Lasix daily with 20 mEq of potassium.  He can keep his feet up and reduce his salt.  Check labs when he comes back in a couple of days to include a C met, CBC, TSH and BNP level.  He is  getting get an echocardiogram Friday morning and I will see him later in the day.  He does understand that he is not improved before I see him again after he will need to go to the hospital emergency room.  He might need to be admitted if I cannot diurese him adequately at home.  ATRIAL FIB: This appears to be new.  Check the echo and likely start Eliquis when I see him back.  He does have  bradycardia arrhythmia and he will need a monitor.  He is not having syncope.  He is not on any AV nodal blocking agents.   HTN:     This will be managed in the context of treating his reduced ejection fraction pending the results above.   DYSLIPIDEMIA:    LDL 41.  He will continue the meds as listed.  Current medicines are reviewed at length with the patient today.  The patient does not have concerns regarding medicines.  The following changes have been made:   As above  Labs/ tests ordered today include:   Orders Placed This Encounter  Procedures  . CBC  . Comprehensive metabolic panel  . TSH  . Pro b natriuretic peptide (BNP)[facility])  . EKG 12-Lead  . ECHOCARDIOGRAM COMPLETE     Disposition:   FU with me on Friiday.      Signed, Minus Breeding, MD  08/31/2019 3:41 PM    [facility]

## 2019-09-01 NOTE — Progress Notes (Signed)
Cardiology Office Note   Date:  09/02/2019   ID:  Kevin Collins, DOB 1930/10/11, MRN [id number]  PCP:  Neale Burly, MD  Cardiologist:   Minus Breeding, MD   Chief Complaint  Patient presents with  . Shortness of Breath      History of Present Illness: Kevin Collins is a 85 y.o. male who presents for follow up of CAD with CABG in 10/1999 with Dr. Roxan Hockey (L - LAD, SVG - Circ OM, SVG - Diag, SVG - PD/PL).   He had a nuclear 06/2009 with an EF of 48%.    I saw him the other day and he had acute heart failure as described.  I sent him home on Lasix and brought him back for an echo and labs as well as clinic follow up today.  He was also noted to be in new atrial fib.  Since he started his diuretic he feels much better.  He has lost about 6 pounds.  He is breathing better.  Is not noticing any new shortness of breath, PND or orthopnea.  He is not having any palpitations, presyncope or syncope.  He has little less ankle swelling than he had.  He had an echo today that demonstrated EF to be about 40 to 45%.  He had moderate tricuspid regurgitation.  There were moderately elevated pulmonary pressures.  Past Medical History:  Diagnosis Date  . Bradycardia, unspecified   . CAD (coronary artery disease)    NATIVE WITHOUT ANGINA  . Hereditary and idiopathic neuropathy, unspecified   . History of shingles   . Hyperlipidemia   . Hypertension    ESSENTIAL PRIMARY  . Peripheral neuropathy     Past Surgical History:  Procedure Laterality Date  . CARDIAC CATHETERIZATION Left 09/1999   NORMAL LEFT MAIN, OCCLUDED LAD, OCCLUDED MID CFX, OCCLUDED PROXIMAL RCA, 60% STENOSIS PROXIMAL DIAG 1, RIGHT TO LEFT COLLATERAL, LEFT TO RIGHT COLLATERAL; LVEF OF 48% DOCUMENTED VIA NUCLEAR STUDY ON 07/12/2009.  Marland Kitchen CATARACT EXTRACTION    . CORONARY ARTERY BYPASS GRAFT     w/ LIMA TO LAD, SVG TO dx, SVG TO OM, SVG TO AM-PD-PL 10/12/99 [facility]  . REMOVAL OF ANKLE PLATE       Current Outpatient  Medications  Medication Sig Dispense Refill  . ezetimibe (ZETIA) 10 MG tablet Take 1 tablet (10 mg total) by mouth daily. 90 tablet 3  . furosemide (LASIX) 40 MG tablet Take 1 tablet (40 mg total) by mouth daily. 30 tablet 6  . gabapentin (NEURONTIN) 800 MG tablet Take 800 mg by mouth 3 (three) times daily.    . hydrochlorothiazide (MICROZIDE) 12.5 MG capsule Take 1 capsule (12.5 mg total) by mouth daily. 90 capsule 3  . lisinopril (ZESTRIL) 40 MG tablet Take 1 tablet (40 mg total) by mouth daily. 90 tablet 3  . Multiple Vitamin (MULTIVITAMIN) tablet Take 1 tablet by mouth daily.    . Multiple Vitamins-Minerals (PRESERVISION AREDS) CAPS Take 1 capsule by mouth 2 (two) times daily.    . Omega-3 Fatty Acids (FISH OIL) 1000 MG CAPS Take 1,000 mg by mouth 3 (three) times daily.    Marland Kitchen oxyCODONE-acetaminophen (PERCOCET) 7.5-325 MG tablet Take 1 tablet by mouth every 4 (four) hours as needed for severe pain.    . potassium chloride SA (K-DUR) 20 MEQ tablet Take 1 tablet (20 mEq total) by mouth daily. 30 tablet 6  . simvastatin (ZOCOR) 80 MG tablet Take 1 tablet (80 mg total) by mouth daily.  90 tablet 3  . apixaban (ELIQUIS) 5 MG TABS tablet Take 1 tablet (5 mg total) by mouth 2 (two) times daily. 180 tablet 3   No current facility-administered medications for this visit.     Allergies:   Patient has no known allergies.    Social History:  The patient  reports that he has quit smoking. He has never used smokeless tobacco. He reports that he does not drink alcohol or use drugs.   Family History:  The patient's family history includes CVA in his father; Other in his mother.    ROS:  Please see the history of present illness.   Otherwise, review of systems are positive for none.   All other systems are reviewed and negative.    PHYSICAL EXAM: VS:  BP (!) 116/52   Pulse (!) 48   Temp (!) 96.9 F (36.1 C) (Temporal)   Ht 5\' 10"  (1.778 m)   Wt 186 lb (84.4 kg)   SpO2 97%   BMI 26.69 kg/m  ,  BMI Body mass index is 26.69 kg/m. GENERAL:  Well appearing NECK:  Positive jugular venous distention with a CV wave, waveform within normal limits, carotid upstroke brisk and symmetric, no bruits, no thyromegaly LUNGS:  Clear to auscultation bilaterally CHEST:  Unremarkable HEART:  PMI not displaced or sustained,S1 and S2 within normal limits, no S3,  no clicks, no rubs, no murmurs, irregular ABD:  Flat, positive bowel sounds normal in frequency in pitch, no bruits, no rebound, no guarding, no midline pulsatile mass, no hepatomegaly, no splenomegaly EXT:  2 plus pulses throughout, no edema, no cyanosis no clubbing    EKG:  EKG is  ordered today. The ekg demonstrates atrial fibrillation, rate 48, poor anterior R wave progression, left axis deviation, low voltage on the limb leads   Recent Labs: 09/02/2019: ALT 18; BUN 16; Creatinine, Ser 1.33; Hemoglobin 13.1; Platelets 122; Potassium 3.8; Sodium 139; TSH 3.359    Lipid Panel No results found for: CHOL, TRIG, HDL, CHOLHDL, VLDL, LDLCALC, LDLDIRECT    Wt Readings from Last 3 Encounters:  09/02/19 186 lb (84.4 kg)  08/31/19 190 lb (86.2 kg)  05/25/19 174 lb (78.9 kg)      Other studies Reviewed: Additional studies/ records that were reviewed today include: Echo Review of the above records demonstrates:  Please see elsewhere in the note.    ASSESSMENT AND PLAN:  CAD:   The patient has no new sypmtoms.  No further cardiovascular testing is indicated.  We will continue with aggressive risk reduction and meds as listed.ceed as below.    ACUTE CHF:   His EF appears to be what it was previously.  Today I am to leave him on the current dose of diuretic.  Labs have been drawn.  I probably reduce this dose going forward.  I will consider further evaluation based on his response.  There is some noted pulmonary hypertension and he might need right heart cath if this persists when he has continued symptoms although he is much improved.    ATRIAL FIB:    This is a new diagnosis.  I am going to apply a 48-hour Holter to make sure this is persistent.  He can be started on Eliquis 5 mg twice daily.  I might consider cardioversion in the future.  Kevin Collins has a CHA2DS2 - VASc score of 5.    HTN:     Uptitrate his medications based on his creatinine and could consider Entresto.  DYSLIPIDEMIA:    LDL was 41.  No change in therapy.   Current medicines are reviewed at length with the patient today.  The patient does not have concerns regarding medicines.  The following changes have been made:   As above  Labs/ tests ordered today include:   Orders Placed This Encounter  Procedures  . HOLTER MONITOR - 48 HOUR  . EKG 12-Lead     Disposition:   FU with me in one month in [place], Minus Breeding, MD  09/02/2019 11:29 AM    [place]

## 2019-09-02 ENCOUNTER — Other Ambulatory Visit: Payer: Self-pay

## 2019-09-02 ENCOUNTER — Other Ambulatory Visit: Payer: Medicare Other | Admitting: *Deleted

## 2019-09-02 ENCOUNTER — Ambulatory Visit (HOSPITAL_COMMUNITY): Payer: Medicare Other | Attending: Cardiovascular Disease

## 2019-09-02 ENCOUNTER — Encounter: Payer: Self-pay | Admitting: Cardiology

## 2019-09-02 ENCOUNTER — Ambulatory Visit (INDEPENDENT_AMBULATORY_CARE_PROVIDER_SITE_OTHER): Payer: Medicare Other | Admitting: Cardiology

## 2019-09-02 VITALS — BP 116/52 | HR 48 | Temp 96.9°F | Ht 70.0 in | Wt 186.0 lb

## 2019-09-02 DIAGNOSIS — I5021 Acute systolic (congestive) heart failure: Secondary | ICD-10-CM | POA: Diagnosis not present

## 2019-09-02 DIAGNOSIS — I509 Heart failure, unspecified: Secondary | ICD-10-CM

## 2019-09-02 DIAGNOSIS — I10 Essential (primary) hypertension: Secondary | ICD-10-CM

## 2019-09-02 DIAGNOSIS — E785 Hyperlipidemia, unspecified: Secondary | ICD-10-CM | POA: Diagnosis not present

## 2019-09-02 DIAGNOSIS — I2510 Atherosclerotic heart disease of native coronary artery without angina pectoris: Secondary | ICD-10-CM

## 2019-09-02 DIAGNOSIS — I4891 Unspecified atrial fibrillation: Secondary | ICD-10-CM

## 2019-09-02 DIAGNOSIS — Z79899 Other long term (current) drug therapy: Secondary | ICD-10-CM

## 2019-09-02 LAB — COMPREHENSIVE METABOLIC PANEL
ALT: 18 U/L (ref 0–44)
AST: 26 U/L (ref 15–41)
Albumin: 4.1 g/dL (ref 3.5–5.0)
Alkaline Phosphatase: 50 U/L (ref 38–126)
Anion gap: 9 (ref 5–15)
BUN: 16 mg/dL (ref 8–23)
CO2: 28 mmol/L (ref 22–32)
Calcium: 10.9 mg/dL — ABNORMAL HIGH (ref 8.9–10.3)
Chloride: 102 mmol/L (ref 98–111)
Creatinine, Ser: 1.33 mg/dL — ABNORMAL HIGH (ref 0.61–1.24)
GFR calc Af Amer: 55 mL/min — ABNORMAL LOW (ref >60)
GFR calc non Af Amer: 48 mL/min — ABNORMAL LOW (ref >60)
Glucose, Bld: 97 mg/dL (ref 70–99)
Potassium: 3.8 mmol/L (ref 3.5–5.1)
Sodium: 139 mmol/L (ref 135–145)
Total Bilirubin: 0.9 mg/dL (ref 0.3–1.2)
Total Protein: 6.2 g/dL — ABNORMAL LOW (ref 6.5–8.1)

## 2019-09-02 LAB — CBC
HCT: 40.9 % (ref 39.0–52.0)
Hemoglobin: 13.1 g/dL (ref 13.0–17.0)
MCH: 29.8 pg (ref 26.0–34.0)
MCHC: 32 g/dL (ref 30.0–36.0)
MCV: 93.2 fL (ref 80.0–100.0)
Platelets: 122 10*3/uL — ABNORMAL LOW (ref 150–400)
RBC: 4.39 MIL/uL (ref 4.22–5.81)
RDW: 12.8 % (ref 11.5–15.5)
WBC: 5.1 10*3/uL (ref 4.0–10.5)
nRBC: 0 % (ref 0.0–0.2)

## 2019-09-02 LAB — TSH: TSH: 3.359 u[IU]/mL (ref 0.350–4.500)

## 2019-09-02 LAB — PRO B NATRIURETIC PEPTIDE: NT-Pro BNP: 1475 pg/mL — ABNORMAL HIGH (ref 0–486)

## 2019-09-02 MED ORDER — APIXABAN 5 MG PO TABS: 5.0000 mg | ORAL_TABLET | Freq: Two times a day (BID) | ORAL | 3 refills | Status: DC

## 2019-09-02 MED ORDER — PERFLUTREN LIPID MICROSPHERE
1.0000 mL | INTRAVENOUS | Status: AC | PRN
  Administered 2019-09-02: 2 mL via INTRAVENOUS

## 2019-09-02 NOTE — Patient Instructions (Addendum)
Medication Instructions:  Start taking Eliquis 5mg  twice daily.   If you need a refill on your cardiac medications before your next appointment, please call your pharmacy.   Lab work: NONE  Testing/Procedures: Your physician has recommended that you wear a 48 hour holter monitor. Holter monitors are medical devices that record the heart's electrical activity. Doctors most often use these monitors to diagnose arrhythmias. Arrhythmias are problems with the speed or rhythm of the heartbeat. The monitor is a small, portable device. You can wear one while you do your normal daily activities. This is usually used to diagnose what is causing palpitations/syncope (passing out). They will call you to set this up.     Follow-Up: At [facility], you and your health needs are our priority.  As part of our continuing mission to provide you with exceptional heart care, we have created designated Provider Care Teams.  These Care Teams include your primary Cardiologist (physician) and Advanced Practice Providers (APPs -  Physician Assistants and Nurse Practitioners) who all work together to provide you with the care you need, when you need it. You will need a follow up appointment in 1 months at the [facility]. You may see Minus Breeding, MD or one of the following Advanced Practice Providers on your designated Care Team:   Rosaria Ferries, PA-C Jory Sims, DNP, ANP

## 2019-09-05 ENCOUNTER — Telehealth: Payer: Self-pay | Admitting: Cardiology

## 2019-09-05 NOTE — Telephone Encounter (Signed)
New Message  Pt c/o medication issue:  1. Name of Medication: apixaban (ELIQUIS) 5 MG TABS tablet  2. How are you currently taking this medication (dosage and times per day)?  Take 1 tablet (5 mg total) by mouth 2 (two) times daily. 3. Are you having a reaction (difficulty breathing--STAT)? No  4. What is your medication issue? Patient states that their is an issue with the medication and [facility] and instructed him to have someone from this office to give [facility] a call about the prescription.   [facility] [phone]

## 2019-09-05 NOTE — Telephone Encounter (Signed)
Late entry: [facility] and spoke with a pharmacist who states pt Eliquis 5 mg BID under review d/t certain criteria. Per pharmacist, pts with decreased kidney function, 83 years of age or older, less than 70 kg in weight, or with serum creatinine greater than 1.5 may need to decrease their Eliquis dosage. She would like to verify if pt should continue on 5 mg BID or decrease. Callback #: [phone]; reference #[id number]  Consulted pharmD Nehemiah Massed, [facility] who reviewed pt chart and criteria and advised that pt to continue on Eliquis 5 mg BID.   [facility]ed [facility] at the number above. Informed pharmacist that pharmD advised that pt to continue on Eliquis 5 mg BID. She verbalized understanding and will update info in system.   Contacted pt to update on the above info. Pt verbalized understanding.

## 2019-09-07 ENCOUNTER — Telehealth: Payer: Self-pay | Admitting: Cardiology

## 2019-09-07 ENCOUNTER — Ambulatory Visit: Payer: Medicare Other

## 2019-09-07 ENCOUNTER — Telehealth: Payer: Self-pay

## 2019-09-07 ENCOUNTER — Other Ambulatory Visit: Payer: Self-pay

## 2019-09-07 DIAGNOSIS — I4891 Unspecified atrial fibrillation: Secondary | ICD-10-CM

## 2019-09-07 MED ORDER — APIXABAN 5 MG PO TABS: 5.0000 mg | ORAL_TABLET | Freq: Two times a day (BID) | ORAL | 0 refills | Status: DC

## 2019-09-07 NOTE — Telephone Encounter (Signed)
Spoke to pt, went over detailed instructions for his monitor. Verified address. Ordered 3 day Zio to be sent to pt's home.

## 2019-09-07 NOTE — Telephone Encounter (Signed)
eliquis refill request sent to [facility]

## 2019-09-07 NOTE — Telephone Encounter (Signed)
Pt has not received his [facility] from [facility] yet and he's requesting for a Rx to be sent to [facility] in [place] so he can go ahead and pick it up. Pt was told by [facility] that he's not sure when it will be sent out.

## 2019-09-07 NOTE — Telephone Encounter (Signed)
LM for pt to call back so I can go over monitor instructions. He will need a 3 day ZIO XT.

## 2019-09-12 ENCOUNTER — Other Ambulatory Visit (INDEPENDENT_AMBULATORY_CARE_PROVIDER_SITE_OTHER): Payer: Medicare Other

## 2019-09-12 DIAGNOSIS — I4891 Unspecified atrial fibrillation: Secondary | ICD-10-CM

## 2019-09-22 ENCOUNTER — Telehealth: Payer: Self-pay | Admitting: Cardiology

## 2019-09-22 DIAGNOSIS — I4891 Unspecified atrial fibrillation: Secondary | ICD-10-CM | POA: Diagnosis not present

## 2019-09-22 NOTE — Telephone Encounter (Signed)
Raquel Sarna from [employer] was calling to report an abnormal  reading from the patient's xio patch  Use reference # [id number] when contacting the company

## 2019-09-22 NOTE — Telephone Encounter (Signed)
Contacted [employer]- slow a-fib; 33 bpm for 1 minute, report has been posted on page 8.  Will route to MD to make aware.

## 2019-09-26 NOTE — Telephone Encounter (Signed)
[employer] noted.  The patient has a appt and this could be moved up.  I think that I see him in [place].

## 2019-09-27 NOTE — H&P (View-Only) (Signed)
Cardiology Office Note   Date:  09/28/2019   ID:  Kevin Collins, DOB [birth year], MRN [id number]  PCP:  Neale Burly, MD  Cardiologist:   Minus Breeding, MD   Chief Complaint  Patient presents with  . Atrial Fibrillation      History of Present Illness: Kevin Collins is a 85 y.o. male who presents for follow up of CAD with CABG in 10/1999 with Dr. Roxan Hockey (L - LAD, SVG - Circ OM, SVG - Diag, SVG - PD/PL).   He had a nuclear 06/2009 with an EF of 48%.    I saw him in Sept and he had acute HF.  I sent him home with Lasix.  He lost 6 lbs and felt much better.  He had an echo with an EF that was mildly reduced at 40 - 45%.  He had moderate TR with moderately elevated pulmonary HTN.   He had good diuresis with that Lasix and the EF noted was not lower than previous.  He wore a Holter and had persistent atrial fib with slow rates.  He had occaisional 4 second pauses.    He says he feels dizzy when he first wakes up in the morning and he swings his feet over the bed.  However, he does not feel presyncopal had any syncope the rest of the day.  He is not having any chest pressure, neck or arm discomfort.  He is not having any new shortness of breath, PND or orthopnea; 100% better than when he did when he walked in the door previously.   Past Medical History:  Diagnosis Date  . Bradycardia, unspecified   . CAD (coronary artery disease)    NATIVE WITHOUT ANGINA  . Hereditary and idiopathic neuropathy, unspecified   . History of shingles   . Hyperlipidemia   . Hypertension    ESSENTIAL PRIMARY  . Peripheral neuropathy     Past Surgical History:  Procedure Laterality Date  . CARDIAC CATHETERIZATION Left 09/1999   NORMAL LEFT MAIN, OCCLUDED LAD, OCCLUDED MID CFX, OCCLUDED PROXIMAL RCA, 60% STENOSIS PROXIMAL DIAG 1, RIGHT TO LEFT COLLATERAL, LEFT TO RIGHT COLLATERAL; LVEF OF 48% DOCUMENTED VIA NUCLEAR STUDY ON 07/12/2009.  Marland Kitchen CATARACT EXTRACTION    . CORONARY ARTERY BYPASS GRAFT     w/ LIMA TO LAD, SVG TO dx, SVG TO OM, SVG TO AM-PD-PL 10/12/99 [facility]  . REMOVAL OF ANKLE PLATE       Current Outpatient Medications  Medication Sig Dispense Refill  . apixaban (ELIQUIS) 5 MG TABS tablet Take 1 tablet (5 mg total) by mouth 2 (two) times daily. 180 tablet 0  . ezetimibe (ZETIA) 10 MG tablet Take 1 tablet (10 mg total) by mouth daily. 90 tablet 3  . furosemide (LASIX) 40 MG tablet Take 1 tablet (40 mg total) by mouth daily. 30 tablet 6  . gabapentin (NEURONTIN) 800 MG tablet Take 800 mg by mouth 3 (three) times daily.    . hydrochlorothiazide (MICROZIDE) 12.5 MG capsule Take 1 capsule (12.5 mg total) by mouth daily. 90 capsule 3  . lisinopril (ZESTRIL) 40 MG tablet Take 1 tablet (40 mg total) by mouth daily. 90 tablet 3  . Multiple Vitamin (MULTIVITAMIN) tablet Take 1 tablet by mouth daily.    . Multiple Vitamins-Minerals (PRESERVISION AREDS) CAPS Take 1 capsule by mouth 2 (two) times daily.    . Omega-3 Fatty Acids (FISH OIL) 1000 MG CAPS Take 1,000 mg by mouth 3 (three) times daily.    Marland Kitchen  oxyCODONE-acetaminophen (PERCOCET) 7.5-325 MG tablet Take 1 tablet by mouth every 4 (four) hours as needed for severe pain.    . potassium chloride SA (K-DUR) 20 MEQ tablet Take 1 tablet (20 mEq total) by mouth daily. 30 tablet 6  . simvastatin (ZOCOR) 80 MG tablet Take 1 tablet (80 mg total) by mouth daily. 90 tablet 3   No current facility-administered medications for this visit.     Allergies:   Patient has no known allergies.    ROS:  Please see the history of present illness.   Otherwise, review of systems are positive for none.   All other systems are reviewed and negative.    PHYSICAL EXAM: VS:  BP 100/60   Pulse (!) 48   Ht 5\' 9"  (1.753 m)   Wt 181 lb (82.1 kg)   BMI 26.73 kg/m  , BMI Body mass index is 26.73 kg/m. GENERAL:  Well appearing NECK:  No jugular venous distention, waveform within normal limits, carotid upstroke brisk and symmetric, no bruits, no  thyromegaly LUNGS:  Clear to auscultation bilaterally CHEST:  Unremarkable HEART:  PMI not displaced or sustained,S1 and S2 within normal limits, no S3, no clicks, no rubs, no murmurs, irregular ABD:  Flat, positive bowel sounds normal in frequency in pitch, no bruits, no rebound, no guarding, no midline pulsatile mass, no hepatomegaly, no splenomegaly EXT:  2 plus pulses throughout, no edema, no cyanosis no clubbing   EKG:  EKG is not ordered today.   Recent Labs: 09/02/2019: ALT 18; BUN 16; Creatinine, Ser 1.33; Hemoglobin 13.1; NT-Pro BNP 1,475; Platelets 122; Potassium 3.8; Sodium 139; TSH 3.359    Lipid Panel No results found for: CHOL, TRIG, HDL, CHOLHDL, VLDL, LDLCALC, LDLDIRECT    Wt Readings from Last 3 Encounters:  09/28/19 181 lb (82.1 kg)  09/02/19 186 lb (84.4 kg)  08/31/19 190 lb (86.2 kg)      Other studies Reviewed: Additional studies/ records that were reviewed today include: Holter Review of the above records demonstrates:  See elsewhere   ASSESSMENT AND PLAN:  CAD:   The patient has no new sypmtoms.  No further cardiovascular testing is indicated.  We will continue with aggressive risk reduction and meds as listed.  ACUTE CHF:   His EF is mild to moderately low .  However, this is unchanged from previous.  I will reduce his Lasix to 20 mg a day.  I will check a basic metabolic profile today.  At this point I am not planning another ischemia work-up.  I am planning therapy as below.  I will consider up titration of the medication or switching to [facility] based on his creatinine.  ATRIAL FIB:    Mr. Kevin Collins has a CHA2DS2 - VASc score of 5.   We will continue with anticoagulation and when planning cardioversion.   He understands the risk of bradycardia arrhythmia immediately following cardioversion and how we would manage this.  He expresses DO NOT RESUSCITATE wishes but understands those will be suspended for this procedure.   HTN:     Will continue the meds  with the change as above.  DYSLIPIDEMIA:    LDL was low.  No change in therapy.  Current medicines are reviewed at length with the patient today.  The patient does not have concerns regarding medicines.  The following changes have been made:   As above  Labs/ tests ordered today include:    No orders of the defined types were placed in this  encounter.    Disposition:   FU with me after the cardioversion.    Signed, Minus Breeding, MD  09/28/2019 12:52 PM    [facility]

## 2019-09-27 NOTE — Telephone Encounter (Signed)
Messaged scheduling for appt.

## 2019-09-27 NOTE — Progress Notes (Signed)
Cardiology Office Note   Date:  09/28/2019   ID:  Kevin Collins, DOB 08-26-1930, MRN [id number]  PCP:  Neale Burly, MD  Cardiologist:   Minus Breeding, MD   Chief Complaint  Patient presents with  . Atrial Fibrillation      History of Present Illness: Kevin Collins is a 85 y.o. male who presents for follow up of CAD with CABG in 10/1999 with Dr. Roxan Hockey (L - LAD, SVG - Circ OM, SVG - Diag, SVG - PD/PL).   He had a nuclear 06/2009 with an EF of 48%.    I saw him in Sept and he had acute HF.  I sent him home with Lasix.  He lost 6 lbs and felt much better.  He had an echo with an EF that was mildly reduced at 40 - 45%.  He had moderate TR with moderately elevated pulmonary HTN.   He had good diuresis with that Lasix and the EF noted was not lower than previous.  He wore a Holter and had persistent atrial fib with slow rates.  He had occaisional 4 second pauses.    He says he feels dizzy when he first wakes up in the morning and he swings his feet over the bed.  However, he does not feel presyncopal had any syncope the rest of the day.  He is not having any chest pressure, neck or arm discomfort.  He is not having any new shortness of breath, PND or orthopnea; 100% better than when he did when he walked in the door previously.   Past Medical History:  Diagnosis Date  . Bradycardia, unspecified   . CAD (coronary artery disease)    NATIVE WITHOUT ANGINA  . Hereditary and idiopathic neuropathy, unspecified   . History of shingles   . Hyperlipidemia   . Hypertension    ESSENTIAL PRIMARY  . Peripheral neuropathy     Past Surgical History:  Procedure Laterality Date  . CARDIAC CATHETERIZATION Left 09/1999   NORMAL LEFT MAIN, OCCLUDED LAD, OCCLUDED MID CFX, OCCLUDED PROXIMAL RCA, 60% STENOSIS PROXIMAL DIAG 1, RIGHT TO LEFT COLLATERAL, LEFT TO RIGHT COLLATERAL; LVEF OF 48% DOCUMENTED VIA NUCLEAR STUDY ON 07/12/2009.  Marland Kitchen CATARACT EXTRACTION    . CORONARY ARTERY BYPASS GRAFT     w/ LIMA TO LAD, SVG TO dx, SVG TO OM, SVG TO AM-PD-PL 10/12/99 [facility]  . REMOVAL OF ANKLE PLATE       Current Outpatient Medications  Medication Sig Dispense Refill  . apixaban (ELIQUIS) 5 MG TABS tablet Take 1 tablet (5 mg total) by mouth 2 (two) times daily. 180 tablet 0  . ezetimibe (ZETIA) 10 MG tablet Take 1 tablet (10 mg total) by mouth daily. 90 tablet 3  . furosemide (LASIX) 40 MG tablet Take 1 tablet (40 mg total) by mouth daily. 30 tablet 6  . gabapentin (NEURONTIN) 800 MG tablet Take 800 mg by mouth 3 (three) times daily.    . hydrochlorothiazide (MICROZIDE) 12.5 MG capsule Take 1 capsule (12.5 mg total) by mouth daily. 90 capsule 3  . lisinopril (ZESTRIL) 40 MG tablet Take 1 tablet (40 mg total) by mouth daily. 90 tablet 3  . Multiple Vitamin (MULTIVITAMIN) tablet Take 1 tablet by mouth daily.    . Multiple Vitamins-Minerals (PRESERVISION AREDS) CAPS Take 1 capsule by mouth 2 (two) times daily.    . Omega-3 Fatty Acids (FISH OIL) 1000 MG CAPS Take 1,000 mg by mouth 3 (three) times daily.    Marland Kitchen  oxyCODONE-acetaminophen (PERCOCET) 7.5-325 MG tablet Take 1 tablet by mouth every 4 (four) hours as needed for severe pain.    . potassium chloride SA (K-DUR) 20 MEQ tablet Take 1 tablet (20 mEq total) by mouth daily. 30 tablet 6  . simvastatin (ZOCOR) 80 MG tablet Take 1 tablet (80 mg total) by mouth daily. 90 tablet 3   No current facility-administered medications for this visit.     Allergies:   Patient has no known allergies.    ROS:  Please see the history of present illness.   Otherwise, review of systems are positive for none.   All other systems are reviewed and negative.    PHYSICAL EXAM: VS:  BP 100/60   Pulse (!) 48   Ht 5\' 9"  (1.753 m)   Wt 181 lb (82.1 kg)   BMI 26.73 kg/m  , BMI Body mass index is 26.73 kg/m. GENERAL:  Well appearing NECK:  No jugular venous distention, waveform within normal limits, carotid upstroke brisk and symmetric, no bruits, no  thyromegaly LUNGS:  Clear to auscultation bilaterally CHEST:  Unremarkable HEART:  PMI not displaced or sustained,S1 and S2 within normal limits, no S3, no clicks, no rubs, no murmurs, irregular ABD:  Flat, positive bowel sounds normal in frequency in pitch, no bruits, no rebound, no guarding, no midline pulsatile mass, no hepatomegaly, no splenomegaly EXT:  2 plus pulses throughout, no edema, no cyanosis no clubbing   EKG:  EKG is not ordered today.   Recent Labs: 09/02/2019: ALT 18; BUN 16; Creatinine, Ser 1.33; Hemoglobin 13.1; NT-Pro BNP 1,475; Platelets 122; Potassium 3.8; Sodium 139; TSH 3.359    Lipid Panel No results found for: CHOL, TRIG, HDL, CHOLHDL, VLDL, LDLCALC, LDLDIRECT    Wt Readings from Last 3 Encounters:  09/28/19 181 lb (82.1 kg)  09/02/19 186 lb (84.4 kg)  08/31/19 190 lb (86.2 kg)      Other studies Reviewed: Additional studies/ records that were reviewed today include: Holter Review of the above records demonstrates:  See elsewhere   ASSESSMENT AND PLAN:  CAD:   The patient has no new sypmtoms.  No further cardiovascular testing is indicated.  We will continue with aggressive risk reduction and meds as listed.  ACUTE CHF:   His EF is mild to moderately low .  However, this is unchanged from previous.  I will reduce his Lasix to 20 mg a day.  I will check a basic metabolic profile today.  At this point I am not planning another ischemia work-up.  I am planning therapy as below.  I will consider up titration of the medication or switching to [facility] based on his creatinine.  ATRIAL FIB:    Kevin Collins has a CHA2DS2 - VASc score of 5.   We will continue with anticoagulation and when planning cardioversion.   He understands the risk of bradycardia arrhythmia immediately following cardioversion and how we would manage this.  He expresses DO NOT RESUSCITATE wishes but understands those will be suspended for this procedure.   HTN:     Will continue the meds  with the change as above.  DYSLIPIDEMIA:    LDL was low.  No change in therapy.  Current medicines are reviewed at length with the patient today.  The patient does not have concerns regarding medicines.  The following changes have been made:   As above  Labs/ tests ordered today include:    No orders of the defined types were placed in this  encounter.    Disposition:   FU with me after the cardioversion.    Signed, Minus Breeding, MD  09/28/2019 12:52 PM    [facility]

## 2019-09-28 ENCOUNTER — Encounter: Payer: Self-pay | Admitting: Cardiology

## 2019-09-28 ENCOUNTER — Other Ambulatory Visit: Payer: Self-pay

## 2019-09-28 ENCOUNTER — Ambulatory Visit (INDEPENDENT_AMBULATORY_CARE_PROVIDER_SITE_OTHER): Payer: Medicare Other | Admitting: Cardiology

## 2019-09-28 ENCOUNTER — Encounter: Payer: Self-pay | Admitting: *Deleted

## 2019-09-28 VITALS — BP 100/60 | HR 48 | Ht 69.0 in | Wt 181.0 lb

## 2019-09-28 DIAGNOSIS — I10 Essential (primary) hypertension: Secondary | ICD-10-CM

## 2019-09-28 DIAGNOSIS — I5021 Acute systolic (congestive) heart failure: Secondary | ICD-10-CM | POA: Diagnosis not present

## 2019-09-28 DIAGNOSIS — I2510 Atherosclerotic heart disease of native coronary artery without angina pectoris: Secondary | ICD-10-CM | POA: Diagnosis not present

## 2019-09-28 DIAGNOSIS — I4891 Unspecified atrial fibrillation: Secondary | ICD-10-CM

## 2019-09-28 DIAGNOSIS — E785 Hyperlipidemia, unspecified: Secondary | ICD-10-CM | POA: Diagnosis not present

## 2019-09-28 NOTE — Patient Instructions (Addendum)
Medication Instructions:  Continue all medications as listed.  If you need a refill on your cardiac medications before your next appointment, please call your pharmacy.   Lab work: Please have blood work (CBC, BMP) at [place], [place], [place]  ([place] screen at [address] [place], [place].  Stay in the far right lane that says Procedural/Pre-test lane.  If you have labs (blood work) drawn today and your tests are completely normal, you will receive your results only by: Marland Kitchen MyChart Message (if you have MyChart) OR . A paper copy in the mail If you have any lab test that is abnormal or we need to change your treatment, we will call you to review the results.  Testing/Procedures: Your physician has requested that you have a Cardioversion.   This will be completed at [facility].  Electrical Cardioversion uses a jolt of electricity to your heart either through paddles or wired patches attached to your chest. This is a controlled, usually prescheduled, procedure. This procedure is done at the hospital and you are not awake during the procedure. You usually go home the day of the procedure. Please see the instruction sheet given to you today for more information.  Follow-Up: Follow up with Dr Percival Spanish after your Cardioversion.  Thank you for choosing [facility]!!

## 2019-09-30 ENCOUNTER — Inpatient Hospital Stay (HOSPITAL_COMMUNITY)
Admission: RE | Admit: 2019-09-30 | Discharge: 2019-09-30 | Disposition: A | Discharge status: Home / Self Care | Payer: Medicare Other | Source: Ambulatory Visit

## 2019-09-30 ENCOUNTER — Other Ambulatory Visit: Payer: Self-pay

## 2019-09-30 ENCOUNTER — Other Ambulatory Visit (HOSPITAL_COMMUNITY)
Admission: RE | Admit: 2019-09-30 | Discharge: 2019-09-30 | Disposition: A | Discharge status: Home / Self Care | Payer: Medicare Other | Source: Ambulatory Visit | Attending: Cardiology | Admitting: Cardiology

## 2019-09-30 ENCOUNTER — Other Ambulatory Visit: Payer: Medicare Other | Admitting: *Deleted

## 2019-09-30 DIAGNOSIS — I4891 Unspecified atrial fibrillation: Secondary | ICD-10-CM

## 2019-09-30 DIAGNOSIS — I5021 Acute systolic (congestive) heart failure: Secondary | ICD-10-CM | POA: Diagnosis not present

## 2019-09-30 LAB — BASIC METABOLIC PANEL
Anion gap: 9 (ref 5–15)
BUN: 25 mg/dL — ABNORMAL HIGH (ref 8–23)
CO2: 29 mmol/L (ref 22–32)
Calcium: 10.7 mg/dL — ABNORMAL HIGH (ref 8.9–10.3)
Chloride: 102 mmol/L (ref 98–111)
Creatinine, Ser: 1.56 mg/dL — ABNORMAL HIGH (ref 0.61–1.24)
GFR calc Af Amer: 46 mL/min — ABNORMAL LOW (ref >60)
GFR calc non Af Amer: 39 mL/min — ABNORMAL LOW (ref >60)
Glucose, Bld: 107 mg/dL — ABNORMAL HIGH (ref 70–99)
Potassium: 4 mmol/L (ref 3.5–5.1)
Sodium: 140 mmol/L (ref 135–145)

## 2019-09-30 LAB — CBC
HCT: 42.1 % (ref 39.0–52.0)
Hemoglobin: 13.8 g/dL (ref 13.0–17.0)
MCH: 30.5 pg (ref 26.0–34.0)
MCHC: 32.8 g/dL (ref 30.0–36.0)
MCV: 93.1 fL (ref 80.0–100.0)
Platelets: 122 10*3/uL — ABNORMAL LOW (ref 150–400)
RBC: 4.52 MIL/uL (ref 4.22–5.81)
RDW: 12.1 % (ref 11.5–15.5)
WBC: 8.2 10*3/uL (ref 4.0–10.5)
nRBC: 0 % (ref 0.0–0.2)

## 2019-09-30 NOTE — Progress Notes (Signed)
LVM regarding pt being too early to  Test for COVID. Pt needs to come on Sat. 10/17 from 9a-12:30p.

## 2019-10-01 ENCOUNTER — Other Ambulatory Visit (HOSPITAL_COMMUNITY)
Admission: RE | Admit: 2019-10-01 | Discharge: 2019-10-01 | Disposition: A | Discharge status: Home / Self Care | Payer: Medicare Other | Source: Ambulatory Visit | Attending: Cardiovascular Disease | Admitting: Cardiovascular Disease

## 2019-10-01 DIAGNOSIS — Z01812 Encounter for preprocedural laboratory examination: Secondary | ICD-10-CM | POA: Insufficient documentation

## 2019-10-01 DIAGNOSIS — Z20828 Contact with and (suspected) exposure to other viral communicable diseases: Secondary | ICD-10-CM | POA: Diagnosis not present

## 2019-10-02 LAB — NOVEL CORONAVIRUS, NAA (HOSP ORDER, SEND-OUT TO REF LAB; TAT 18-24 HRS): SARS-CoV-2, NAA: NOT DETECTED

## 2019-10-04 ENCOUNTER — Telehealth: Payer: Self-pay | Admitting: *Deleted

## 2019-10-04 DIAGNOSIS — N289 Disorder of kidney and ureter, unspecified: Secondary | ICD-10-CM

## 2019-10-04 NOTE — Telephone Encounter (Signed)
Advised patient of lab results, sent to PCP, and mailed lab orders

## 2019-10-04 NOTE — Telephone Encounter (Signed)
-----   Message from Minus Breeding, MD sent at 10/02/2019  1:10 PM EDT ----- Creat is up slightly.  Follow up in two weeks with [facility]. Call Mr. Tyndall with the results and send results to Neale Burly, MD

## 2019-10-05 ENCOUNTER — Other Ambulatory Visit: Payer: Self-pay

## 2019-10-05 ENCOUNTER — Ambulatory Visit (HOSPITAL_COMMUNITY): Payer: Medicare Other | Admitting: Anesthesiology

## 2019-10-05 ENCOUNTER — Ambulatory Visit (HOSPITAL_COMMUNITY)
Admission: RE | Admit: 2019-10-05 | Discharge: 2019-10-05 | Disposition: A | Discharge status: Home / Self Care | Payer: Medicare Other | Attending: Cardiovascular Disease | Admitting: Cardiovascular Disease

## 2019-10-05 ENCOUNTER — Encounter (HOSPITAL_COMMUNITY)
Admission: RE | Disposition: A | Discharge status: Home / Self Care | Payer: Self-pay | Source: Home / Self Care | Attending: Cardiovascular Disease

## 2019-10-05 ENCOUNTER — Encounter (HOSPITAL_COMMUNITY): Payer: Self-pay | Admitting: Emergency Medicine

## 2019-10-05 DIAGNOSIS — I440 Atrioventricular block, first degree: Secondary | ICD-10-CM | POA: Diagnosis not present

## 2019-10-05 DIAGNOSIS — E785 Hyperlipidemia, unspecified: Secondary | ICD-10-CM | POA: Insufficient documentation

## 2019-10-05 DIAGNOSIS — I2510 Atherosclerotic heart disease of native coronary artery without angina pectoris: Secondary | ICD-10-CM | POA: Diagnosis not present

## 2019-10-05 DIAGNOSIS — Z79899 Other long term (current) drug therapy: Secondary | ICD-10-CM | POA: Diagnosis not present

## 2019-10-05 DIAGNOSIS — I4891 Unspecified atrial fibrillation: Secondary | ICD-10-CM | POA: Diagnosis not present

## 2019-10-05 DIAGNOSIS — I4819 Other persistent atrial fibrillation: Secondary | ICD-10-CM | POA: Diagnosis not present

## 2019-10-05 DIAGNOSIS — Z951 Presence of aortocoronary bypass graft: Secondary | ICD-10-CM | POA: Diagnosis not present

## 2019-10-05 DIAGNOSIS — Z7901 Long term (current) use of anticoagulants: Secondary | ICD-10-CM | POA: Insufficient documentation

## 2019-10-05 DIAGNOSIS — G629 Polyneuropathy, unspecified: Secondary | ICD-10-CM | POA: Diagnosis not present

## 2019-10-05 DIAGNOSIS — I110 Hypertensive heart disease with heart failure: Secondary | ICD-10-CM | POA: Insufficient documentation

## 2019-10-05 DIAGNOSIS — I509 Heart failure, unspecified: Secondary | ICD-10-CM | POA: Diagnosis not present

## 2019-10-05 HISTORY — PX: CARDIOVERSION: SHX1299

## 2019-10-05 LAB — POCT I-STAT, CHEM 8
BUN: 27 mg/dL — ABNORMAL HIGH (ref 8–23)
Calcium, Ion: 1.34 mmol/L (ref 1.15–1.40)
Chloride: 103 mmol/L (ref 98–111)
Creatinine, Ser: 1.2 mg/dL (ref 0.61–1.24)
Glucose, Bld: 105 mg/dL — ABNORMAL HIGH (ref 70–99)
HCT: 41 % (ref 39.0–52.0)
Hemoglobin: 13.9 g/dL (ref 13.0–17.0)
Potassium: 4 mmol/L (ref 3.5–5.1)
Sodium: 140 mmol/L (ref 135–145)
TCO2: 25 mmol/L (ref 22–32)

## 2019-10-05 SURGERY — CARDIOVERSION
Anesthesia: General

## 2019-10-05 MED ORDER — LIDOCAINE 2% (20 MG/ML) 5 ML SYRINGE
INTRAMUSCULAR | Status: DC | PRN
  Administered 2019-10-05: 60 mg via INTRAVENOUS

## 2019-10-05 MED ORDER — SODIUM CHLORIDE 0.9 % IV SOLN
INTRAVENOUS | Status: DC
  Administered 2019-10-05: 10:00:00 via INTRAVENOUS

## 2019-10-05 MED ORDER — PROPOFOL 10 MG/ML IV BOLUS
INTRAVENOUS | Status: DC | PRN
  Administered 2019-10-05: 50 mg via INTRAVENOUS

## 2019-10-05 NOTE — Anesthesia Procedure Notes (Signed)
Procedure Name: MAC Date/Time: 10/05/2019 10:19 AM Performed by: Teressa Lower., CRNA Pre-anesthesia Checklist: Patient identified, Emergency Drugs available, Suction available, Patient being monitored and Timeout performed Patient Re-evaluated:Patient Re-evaluated prior to induction Oxygen Delivery Method: Ambu bag Preoxygenation: Pre-oxygenation with 100% oxygen

## 2019-10-05 NOTE — Interval H&P Note (Signed)
History and Physical Interval Note:  10/05/2019 9:54 AM  Kevin Collins  has presented today for surgery, with the diagnosis of atrial fibrillation.  The various methods of treatment have been discussed with the patient and family. After consideration of risks, benefits and other options for treatment, the patient has consented to  Procedure(s): CARDIOVERSION (N/A) as a surgical intervention.  The patient's history has been reviewed, patient examined, no change in status, stable for surgery.  I have reviewed the patient's chart and labs.  Questions were answered to the patient's satisfaction.     Trystan Akhtar

## 2019-10-05 NOTE — Anesthesia Postprocedure Evaluation (Signed)
Anesthesia Post Note  Patient: Kevin Collins  Procedure(s) Performed: CARDIOVERSION (N/A )     Patient location during evaluation: Endoscopy Anesthesia Type: General Level of consciousness: awake and alert Pain management: pain level controlled Vital Signs Assessment: post-procedure vital signs reviewed and stable Respiratory status: spontaneous breathing, nonlabored ventilation and respiratory function stable Cardiovascular status: blood pressure returned to baseline and stable Postop Assessment: no apparent nausea or vomiting Anesthetic complications: no    Last Vitals:  Vitals:   10/05/19 1020 10/05/19 1030  BP: 107/61 (!) 103/52  Pulse: 64 (!) 43  Resp: 18 15  Temp:    SpO2: 100% 100%    Last Pain:  Vitals:   10/05/19 1030  TempSrc:   PainSc: 0-No pain                 Lidia Collum

## 2019-10-05 NOTE — Op Note (Signed)
Procedure: Electrical Cardioversion Indications:  Atrial Fibrillation  Procedure Details:  Consent: Risks of procedure as well as the alternatives and risks of each were explained to the (patient/caregiver).  Consent for procedure obtained.  Time Out: Verified patient identification, verified procedure, site/side was marked, verified correct patient position, special equipment/implants available, medications/allergies/relevent history reviewed, required imaging and test results available.  Performed  Patient placed on cardiac monitor, pulse oximetry, supplemental oxygen as necessary.  Sedation given: Propofol 50 mg IV, Dr. Christella Hartigan Pacer pads placed anterior and posterior chest.  Cardioverted 2 time(s).  Cardioversion with synchronized biphasic 120J shock (unsuccessful) and 200J shock.  Evaluation: Findings: Post procedure EKG shows: sinus bradycardia 55 bpm Complications: None Patient did tolerate procedure well.  Time Spent Directly with the Patient:  30 minutes   Nikoleta Dady 10/05/2019, 10:17 AM

## 2019-10-05 NOTE — Transfer of Care (Signed)
Immediate Anesthesia Transfer of Care Note  Patient: Kevin Collins  Procedure(s) Performed: CARDIOVERSION (N/A )  Patient Location: Endoscopy Unit  Anesthesia Type:MAC  Level of Consciousness: awake, alert  and oriented  Airway & Oxygen Therapy: Patient Spontanous Breathing and Patient connected to nasal cannula oxygen  Post-op Assessment: Report given to RN and Post -op Vital signs reviewed and stable  Post vital signs: Reviewed and stable  Last Vitals:  Vitals Value Taken Time  BP    Temp    Pulse    Resp    SpO2      Last Pain:  Vitals:   10/05/19 0923  TempSrc: Oral  PainSc: 0-No pain         Complications: No apparent anesthesia complications

## 2019-10-05 NOTE — Anesthesia Preprocedure Evaluation (Addendum)
Anesthesia Evaluation  Patient identified by MRN, date of birth, ID band Patient awake    Reviewed: Allergy & Precautions, NPO status , Patient's Chart, lab work & pertinent test results  History of Anesthesia Complications Negative for: history of anesthetic complications  Airway Mallampati: II  TM Distance: >3 FB Neck ROM: Full    Dental   Pulmonary neg pulmonary ROS, former smoker,    Pulmonary exam normal        Cardiovascular hypertension, Pt. on medications + CAD   Rhythm:Irregular Rate:Bradycardia     Neuro/Psych negative neurological ROS  negative psych ROS   GI/Hepatic negative GI ROS, Neg liver ROS,   Endo/Other  negative endocrine ROS  Renal/GU negative Renal ROS  negative genitourinary   Musculoskeletal negative musculoskeletal ROS (+)   Abdominal   Peds  Hematology negative hematology ROS (+)   Anesthesia Other Findings Echo 09/02/19: EF 40-45%, low normal RV function, mild AI, no aortic stenosis, moderately elevated PASP  Reproductive/Obstetrics                            Anesthesia Physical Anesthesia Plan  ASA: III  Anesthesia Plan: General   Post-op Pain Management:    Induction: Intravenous  PONV Risk Score and Plan: TIVA and Treatment may vary due to age or medical condition  Airway Management Planned: Mask  Additional Equipment: None  Intra-op Plan:   Post-operative Plan:   Informed Consent: I have reviewed the patients History and Physical, chart, labs and discussed the procedure including the risks, benefits and alternatives for the proposed anesthesia with the patient or authorized representative who has indicated his/her understanding and acceptance.       Plan Discussed with:   Anesthesia Plan Comments:        Anesthesia Quick Evaluation

## 2019-10-07 ENCOUNTER — Encounter (HOSPITAL_COMMUNITY): Payer: Self-pay | Admitting: Cardiovascular Disease

## 2019-10-11 DIAGNOSIS — B351 Tinea unguium: Secondary | ICD-10-CM | POA: Diagnosis not present

## 2019-10-11 DIAGNOSIS — L84 Corns and callosities: Secondary | ICD-10-CM | POA: Diagnosis not present

## 2019-10-11 DIAGNOSIS — M79673 Pain in unspecified foot: Secondary | ICD-10-CM | POA: Diagnosis not present

## 2019-10-11 DIAGNOSIS — G609 Hereditary and idiopathic neuropathy, unspecified: Secondary | ICD-10-CM | POA: Diagnosis not present

## 2019-10-14 ENCOUNTER — Other Ambulatory Visit: Payer: Medicare Other

## 2019-10-14 ENCOUNTER — Other Ambulatory Visit: Payer: Self-pay

## 2019-10-14 DIAGNOSIS — N289 Disorder of kidney and ureter, unspecified: Secondary | ICD-10-CM | POA: Diagnosis not present

## 2019-10-15 LAB — BASIC METABOLIC PANEL
BUN/Creatinine Ratio: 15 (ref 10–24)
BUN: 22 mg/dL (ref 8–27)
CO2: 26 mmol/L (ref 20–29)
Calcium: 11.3 mg/dL — ABNORMAL HIGH (ref 8.6–10.2)
Chloride: 101 mmol/L (ref 96–106)
Creatinine, Ser: 1.44 mg/dL — ABNORMAL HIGH (ref 0.76–1.27)
GFR calc Af Amer: 50 mL/min/{1.73_m2} — ABNORMAL LOW (ref >59)
GFR calc non Af Amer: 43 mL/min/{1.73_m2} — ABNORMAL LOW (ref >59)
Glucose: 107 mg/dL — ABNORMAL HIGH (ref 65–99)
Potassium: 3.9 mmol/L (ref 3.5–5.2)
Sodium: 143 mmol/L (ref 134–144)

## 2019-10-26 ENCOUNTER — Ambulatory Visit: Payer: Medicare Other | Admitting: Cardiology

## 2019-11-01 ENCOUNTER — Telehealth: Payer: Self-pay | Admitting: Cardiology

## 2019-11-01 MED ORDER — FUROSEMIDE 20 MG PO TABS: 20.0000 mg | ORAL_TABLET | Freq: Every day | ORAL | 3 refills | Status: DC

## 2019-11-01 NOTE — Telephone Encounter (Signed)
Spoke with pt who reviewed 10/31 BMP lab results mailed to him and states that the 11/1 result note mentioned that his Lasix was decreased but he is still on 40 mg of Lasix daily. Reviewed 10/14 office note:  "ACUTE CHF:   His EF is mild to moderately low .  However, this is unchanged from previous.  I will reduce his Lasix to 20 mg a day.  I will check a basic metabolic profile today.  At this point I am not planning another ischemia work-up.  I am planning therapy as below.  I will consider up titration of the medication or switching to [facility] based on his creatinine."  Advised pt to take Lasix 20 mg daily and informed that nurse would send Rx to his pharmacy on file. Pt verbalized understanding

## 2019-11-01 NOTE — Telephone Encounter (Signed)
Patient is calling with a question about lab results.

## 2019-11-03 DIAGNOSIS — M545 Low back pain: Secondary | ICD-10-CM | POA: Diagnosis not present

## 2019-11-03 DIAGNOSIS — M10262 Drug-induced gout, left knee: Secondary | ICD-10-CM | POA: Diagnosis not present

## 2019-11-03 DIAGNOSIS — I5032 Chronic diastolic (congestive) heart failure: Secondary | ICD-10-CM | POA: Diagnosis not present

## 2019-11-03 DIAGNOSIS — N182 Chronic kidney disease, stage 2 (mild): Secondary | ICD-10-CM | POA: Diagnosis not present

## 2019-11-03 DIAGNOSIS — I480 Paroxysmal atrial fibrillation: Secondary | ICD-10-CM | POA: Diagnosis not present

## 2019-11-03 DIAGNOSIS — G603 Idiopathic progressive neuropathy: Secondary | ICD-10-CM | POA: Diagnosis not present

## 2019-12-06 ENCOUNTER — Other Ambulatory Visit: Payer: Self-pay

## 2019-12-06 MED ORDER — FUROSEMIDE 20 MG PO TABS: 20.0000 mg | ORAL_TABLET | Freq: Every day | ORAL | 3 refills | Status: DC

## 2019-12-19 ENCOUNTER — Telehealth: Payer: Self-pay | Admitting: Cardiology

## 2019-12-19 NOTE — Telephone Encounter (Signed)
Pt c/o medication issue:  1. Name of Medication: apixaban (ELIQUIS) 5 MG TABS tablet  2. How are you currently taking this medication (dosage and times per day)? 5 mg twice a day  3. Are you having a reaction (difficulty breathing--STAT)? no  4. What is your medication issue? Patient states he is not having any energy and takes a long time to get out of bed without falling. He says he can't walk and look straight up at the same time. He says his dizzy spells are not getting better and would like to know if he needs to be seen.

## 2019-12-19 NOTE — Telephone Encounter (Signed)
Returned call to patient he stated for the last week he has been waking up in mornings feeling "weird".Feels like he might faint,shaky.He has not passed out.Stated he had a cardioversion appox 4 weeks ago.He cannot tell if heart out of rhythm.Appointment scheduled with Almyra Deforest PA 12/22/19 at 11:00 am.

## 2019-12-21 ENCOUNTER — Other Ambulatory Visit: Payer: Self-pay

## 2019-12-21 MED ORDER — POTASSIUM CHLORIDE CRYS ER 20 MEQ PO TBCR: 20.0000 meq | EXTENDED_RELEASE_TABLET | Freq: Every day | ORAL | 3 refills | Status: DC

## 2019-12-22 ENCOUNTER — Encounter: Payer: Self-pay | Admitting: Physician Assistant

## 2019-12-22 ENCOUNTER — Other Ambulatory Visit: Payer: Self-pay

## 2019-12-22 ENCOUNTER — Ambulatory Visit (INDEPENDENT_AMBULATORY_CARE_PROVIDER_SITE_OTHER): Payer: Medicare Other | Admitting: Physician Assistant

## 2019-12-22 VITALS — BP 122/60 | HR 64 | Temp 97.2°F | Ht 70.0 in | Wt 179.0 lb

## 2019-12-22 DIAGNOSIS — R0989 Other specified symptoms and signs involving the circulatory and respiratory systems: Secondary | ICD-10-CM

## 2019-12-22 DIAGNOSIS — I25810 Atherosclerosis of coronary artery bypass graft(s) without angina pectoris: Secondary | ICD-10-CM | POA: Diagnosis not present

## 2019-12-22 DIAGNOSIS — I10 Essential (primary) hypertension: Secondary | ICD-10-CM

## 2019-12-22 DIAGNOSIS — R42 Dizziness and giddiness: Secondary | ICD-10-CM

## 2019-12-22 DIAGNOSIS — I480 Paroxysmal atrial fibrillation: Secondary | ICD-10-CM

## 2019-12-22 DIAGNOSIS — E785 Hyperlipidemia, unspecified: Secondary | ICD-10-CM

## 2019-12-22 NOTE — Patient Instructions (Addendum)
Medication Instructions:  STOP Hydrochlorothiazide  *If you need a refill on your cardiac medications before your next appointment, please call your pharmacy*  Lab Work: Your physician recommends that you return for lab work in: [place] -[facility] If you have labs (blood work) drawn today and your tests are completely normal, you will receive your results only by: Marland Kitchen MyChart Message (if you have MyChart) OR . A paper copy in the mail If you have any lab test that is abnormal or we need to change your treatment, we will call you to review the results.  Testing/Procedures: Your physician has requested that you have a carotid duplex. This test is an ultrasound of the carotid arteries in your neck. It looks at blood flow through these arteries that supply the brain with blood. Allow one hour for this exam. There are no restrictions or special instructions.  Follow-Up: At [facility], you and your health needs are our priority.  As part of our continuing mission to provide you with exceptional heart care, we have created designated Provider Care Teams.  These Care Teams include your primary Cardiologist (physician) and Advanced Practice Providers (APPs -  Physician Assistants and Nurse Practitioners) who all work together to provide you with the care you need, when you need it.  Your next appointment:   3 week(s) ( ON A DAY DR [facility] IS IN THE OFFICE)  The format for your next appointment:   In Person  Provider:   Almyra Deforest, PA-C  Other Instructions

## 2019-12-22 NOTE — Progress Notes (Signed)
Cardiology Office Note:    Date:  12/24/2019   ID:  Kevin Collins, DOB [birth year], MRN [id number]  PCP:  Neale Burly, MD  Cardiologist:  Minus Breeding, MD  Electrophysiologist:  None   Referring MD: Neale Burly, MD   Chief Complaint  Patient presents with  . Follow-up    dizziness, seen for Dr. Percival Spanish    History of Present Illness:    Kevin Collins is a 86 y.o. male with a hx of CAD s/p CABG in 10/1999 (LIMA-LAD, SVG-OM, SVG-diagonal, SVG-PDA/PL), history of bradycardia, PAF, hypertension, hyperlipidemia and peripheral neuropathy.  Myoview in July 2010 showed EF 48%.  Recent echocardiogram obtained on 09/02/2019 showed EF 40 to 45%, mild and the basal inferior wall and septal hypokinesis, moderate LAE, trace mitral regurgitation, mild to moderate aortic calcification without evidence of aortic stenosis, mild AI, moderately elevated pulmonary arterial systolic pressure.  Patient underwent diuresis for acute heart failure.  He wore a Holter monitor that showed persistent A. fib with slow heart rate.  He will occasionally had 4-second pauses.  Patient was last seen by Dr. Percival Spanish on 09/28/2019, he was compliant with anticoagulation therapy.  After discussing various options, he was agreeable to proceed with cardioversion.  Lasix was reduced to 200 mg daily.  He eventually underwent successful cardioversion x2 by Dr. Sallyanne Kuster on 10/05/2019.  Patient presents today for cardiology office visit.  He has went back into atrial fibrillation based on today's EKG.  Heart rate on the EKG was 45 bpm.  He is feeling well today however in the past few days, he has been having significant dizzy spell when he change body positions.  His blood pressure is normal today, I recommended discontinuing his hydrochlorothiazide to allow his blood pressure to drift a little higher in order to avoid significant orthostatic hypotension.  He does confirm he is on the 20 mg daily of Lasix as well.  His symptom is less  consistent with symptomatic bradycardia, however more consistent with orthostatic hypotension.  He says when he looks up he also have significant dizzy spell as well.  He has a carotid bruit, I plan to obtain carotid Doppler as well.  I discussed the case with Dr. Percival Spanish, with his age and comorbidity, we recommend more conservative management at this time.  If his symptoms persist and felt to be related to symptomatic bradycardia later, we may have to consider pacemaker.   Past Medical History:  Diagnosis Date  . Bradycardia, unspecified   . CAD (coronary artery disease)    NATIVE WITHOUT ANGINA  . Hereditary and idiopathic neuropathy, unspecified   . History of shingles   . Hyperlipidemia   . Hypertension    ESSENTIAL PRIMARY  . Peripheral neuropathy     Past Surgical History:  Procedure Laterality Date  . CARDIAC CATHETERIZATION Left 09/1999   NORMAL LEFT MAIN, OCCLUDED LAD, OCCLUDED MID CFX, OCCLUDED PROXIMAL RCA, 60% STENOSIS PROXIMAL DIAG 1, RIGHT TO LEFT COLLATERAL, LEFT TO RIGHT COLLATERAL; LVEF OF 48% DOCUMENTED VIA NUCLEAR STUDY ON 07/12/2009.  Marland Kitchen CARDIOVERSION N/A 10/05/2019   Procedure: CARDIOVERSION;  Surgeon: Sanda Klein, MD;  Location: [facility] ENDOSCOPY;  Service: Cardiovascular;  Laterality: N/A;  . CATARACT EXTRACTION    . CORONARY ARTERY BYPASS GRAFT     w/ LIMA TO LAD, SVG TO dx, SVG TO OM, SVG TO AM-PD-PL 10/12/99 [place]  . REMOVAL OF ANKLE PLATE      Current Medications: Current Meds  Medication Sig  . apixaban (ELIQUIS) 5 MG  TABS tablet Take 1 tablet (5 mg total) by mouth 2 (two) times daily.  Marland Kitchen ezetimibe (ZETIA) 10 MG tablet Take 1 tablet (10 mg total) by mouth daily.  . furosemide (LASIX) 20 MG tablet Take 1 tablet (20 mg total) by mouth daily.  Marland Kitchen gabapentin (NEURONTIN) 800 MG tablet Take 800 mg by mouth 3 (three) times daily.  Marland Kitchen lisinopril (ZESTRIL) 40 MG tablet Take 1 tablet (40 mg total) by mouth daily.  . Multiple Vitamins-Minerals (PRESERVISION  AREDS) CAPS Take 1 capsule by mouth 2 (two) times daily.  . Omega-3 Fatty Acids (FISH OIL) 1000 MG CAPS Take 1,000 mg by mouth 3 (three) times daily.  Marland Kitchen oxyCODONE-acetaminophen (PERCOCET) 7.5-325 MG tablet Take 1 tablet by mouth 3 (three) times daily.   . potassium chloride SA (KLOR-CON) 20 MEQ tablet Take 1 tablet (20 mEq total) by mouth daily.  . simvastatin (ZOCOR) 80 MG tablet Take 1 tablet (80 mg total) by mouth daily.  . [DISCONTINUED] hydrochlorothiazide (MICROZIDE) 12.5 MG capsule Take 1 capsule (12.5 mg total) by mouth daily.     Allergies:   Patient has no known allergies.   Social History   Socioeconomic History  . Marital status: Divorced    Spouse name: Not on file  . Number of children: 1  . Years of education: Not on file  . Highest education level: Not on file  Occupational History  . Occupation: RETIRED  Tobacco Use  . Smoking status: Former Research scientist (life sciences)  . Smokeless tobacco: Never Used  Substance and Sexual Activity  . Alcohol use: Never  . Drug use: Never  . Sexual activity: Not on file  Other Topics Concern  . Not on file  Social History Narrative   His son lives with him.  He is retired Nature conservation officer.     Social Determinants of Health   Financial Resource Strain:   . Difficulty of Paying Living Expenses: Not on file  Food Insecurity:   . Worried About Charity fundraiser in the Last Year: Not on file  . Ran Out of Food in the Last Year: Not on file  Transportation Needs:   . Lack of Transportation (Medical): Not on file  . Lack of Transportation (Non-Medical): Not on file  Physical Activity:   . Days of Exercise per Week: Not on file  . Minutes of Exercise per Session: Not on file  Stress:   . Feeling of Stress : Not on file  Social Connections:   . Frequency of Communication with Friends and Family: Not on file  . Frequency of Social Gatherings with Friends and Family: Not on file  . Attends Religious Services: Not on file  . Active Member of Clubs or  Organizations: Not on file  . Attends Archivist Meetings: Not on file  . Marital Status: Not on file     Family History: The patient's family history includes CVA in his father; Other in his mother.  ROS:   Please see the history of present illness.     All other systems reviewed and are negative.  EKGs/Labs/Other Studies Reviewed:    The following studies were reviewed today:  Echo 09/02/2019 IMPRESSIONS  1. Left ventricular ejection fraction, by visual estimation, is 40 to 45%. The left ventricle has moderately decreased function. Mildly increased left ventricular size. Left ventricular septal wall thickness was normal. There is mildly increased left  ventricular hypertrophy.  2. Mid and basal inferior wall and septal hypokinesis.  3. Global right ventricle has  low normal systolic function.The right ventricular size is mildly enlarged. No increase in right ventricular wall thickness.  4. Left atrial size was moderately dilated.  5. Right atrial size was moderately dilated.  6. The mitral valve is normal in structure. Trace mitral valve regurgitation.  7. The tricuspid valve is normal in structure. Tricuspid valve regurgitation moderate.  8. The aortic valve is normal in structure. Aortic valve regurgitation is mild by color flow Doppler. Mild to moderate aortic valve sclerosis/calcification without any evidence of aortic stenosis.  9. The pulmonic valve was grossly normal. Pulmonic valve regurgitation is mild by color flow Doppler. 10. Moderately elevated pulmonary artery systolic pressure.  EKG:  EKG is ordered today.  The ekg ordered today demonstrates atrial fibrillation, heart rate 44  Recent Labs: 09/02/2019: ALT 18; NT-Pro BNP 1,475; TSH 3.359 09/30/2019: Platelets 122 10/05/2019: Hemoglobin 13.9 10/14/2019: BUN 22; Creatinine, Ser 1.44; Potassium 3.9; Sodium 143  Recent Lipid Panel No results found for: CHOL, TRIG, HDL, CHOLHDL, VLDL, LDLCALC,  LDLDIRECT  Physical Exam:    VS:  BP 122/60   Pulse 64   Temp (!) 97.2 F (36.2 C) (Temporal)   Ht 5\' 10"  (1.778 m)   Wt 179 lb (81.2 kg)   SpO2 96%   BMI 25.68 kg/m     Wt Readings from Last 3 Encounters:  12/22/19 179 lb (81.2 kg)  10/05/19 170 lb 12.8 oz (77.5 kg)  09/28/19 181 lb (82.1 kg)     GEN:  Well nourished, well developed in no acute distress HEENT: Normal NECK: No JVD; No carotid bruits LYMPHATICS: No lymphadenopathy CARDIAC: Irregularly irregular, no murmurs, rubs, gallops RESPIRATORY:  Clear to auscultation without rales, wheezing or rhonchi  ABDOMEN: Soft, non-tender, non-distended MUSCULOSKELETAL:  No edema; No deformity  SKIN: Warm and dry NEUROLOGIC:  Alert and oriented x 3 PSYCHIATRIC:  Normal affect   ASSESSMENT:    1. PAF (paroxysmal atrial fibrillation) ([place])   2. Bruit   3. Coronary artery disease involving coronary bypass graft of native heart without angina pectoris   4. Essential hypertension   5. Hyperlipidemia LDL goal <70   6. Dizziness    PLAN:    In order of problems listed above:  1. PAF: Patient has recurrent atrial fibrillation with slow ventricular rate. Heart rate in the 40s. We will leave him in atrial fibrillation for now.  2. Dizziness: Even though patient's heart rate is in the forties, symptom of dizziness is more consistent with orthostatic changes. He also has dizziness when looking up, I will obtain a carotid Doppler given presence of carotid bruit. If symptom worsens, may need to consider a pacemaker, however given his age and comorbidity, we would prefer to manage medically if at all possible  3. CAD: Denies any chest pain. Not on aspirin given the need for Eliquis.  4. Hypertension: Blood pressure stable, however with recent dizziness, I will discontinue hydrochlorothiazide  5. Hyperlipidemia: Continue Zetia and Zocor   Medication Adjustments/Labs and Tests Ordered: Current medicines are reviewed at length with  the patient today.  Concerns regarding medicines are outlined above.  Orders Placed This Encounter  Procedures  . Basic Metabolic Panel (BMET)  . EKG 12-Lead  . VAS US CAROTID   No orders of the defined types were placed in this encounter.   Patient Instructions  Medication Instructions:  STOP Hydrochlorothiazide  *If you need a refill on your cardiac medications before your next appointment, please call your pharmacy*  Lab Work: Your physician recommends  that you return for lab work in: [place] -BMET If you have labs (blood work) drawn today and your tests are completely normal, you will receive your results only by: Marland Kitchen MyChart Message (if you have MyChart) OR . A paper copy in the mail If you have any lab test that is abnormal or we need to change your treatment, we will call you to review the results.  Testing/Procedures: Your physician has requested that you have a carotid duplex. This test is an ultrasound of the carotid arteries in your neck. It looks at blood flow through these arteries that supply the brain with blood. Allow one hour for this exam. There are no restrictions or special instructions.  Follow-Up: At [facility], you and your health needs are our priority.  As part of our continuing mission to provide you with exceptional heart care, we have created designated Provider Care Teams.  These Care Teams include your primary Cardiologist (physician) and Advanced Practice Providers (APPs -  Physician Assistants and Nurse Practitioners) who all work together to provide you with the care you need, when you need it.  Your next appointment:   3 week(s) ( ON A DAY DR [facility] IS IN THE OFFICE)  The format for your next appointment:   In Person  Provider:   Almyra Deforest, PA-C  Other Instructions     Signed, Almyra Deforest, [place]  12/24/2019 11:18 PM    [place]

## 2019-12-24 ENCOUNTER — Encounter: Payer: Self-pay | Admitting: Physician Assistant

## 2019-12-26 ENCOUNTER — Ambulatory Visit (HOSPITAL_COMMUNITY)
Admission: RE | Admit: 2019-12-26 | Discharge: 2019-12-26 | Disposition: A | Discharge status: Home / Self Care | Payer: Medicare Other | Source: Ambulatory Visit | Attending: Cardiology | Admitting: Cardiology

## 2019-12-26 ENCOUNTER — Other Ambulatory Visit: Payer: Self-pay

## 2019-12-26 DIAGNOSIS — R0989 Other specified symptoms and signs involving the circulatory and respiratory systems: Secondary | ICD-10-CM | POA: Insufficient documentation

## 2019-12-26 DIAGNOSIS — I25810 Atherosclerosis of coronary artery bypass graft(s) without angina pectoris: Secondary | ICD-10-CM | POA: Diagnosis not present

## 2019-12-27 LAB — BASIC METABOLIC PANEL
BUN/Creatinine Ratio: 14 (ref 10–24)
BUN: 18 mg/dL (ref 8–27)
CO2: 25 mmol/L (ref 20–29)
Calcium: 10.8 mg/dL — ABNORMAL HIGH (ref 8.6–10.2)
Chloride: 102 mmol/L (ref 96–106)
Creatinine, Ser: 1.33 mg/dL — ABNORMAL HIGH (ref 0.76–1.27)
GFR calc Af Amer: 55 mL/min/{1.73_m2} — ABNORMAL LOW (ref >59)
GFR calc non Af Amer: 48 mL/min/{1.73_m2} — ABNORMAL LOW (ref >59)
Glucose: 86 mg/dL (ref 65–99)
Potassium: 4.7 mmol/L (ref 3.5–5.2)
Sodium: 143 mmol/L (ref 134–144)

## 2020-01-10 DIAGNOSIS — G609 Hereditary and idiopathic neuropathy, unspecified: Secondary | ICD-10-CM | POA: Diagnosis not present

## 2020-01-10 DIAGNOSIS — L84 Corns and callosities: Secondary | ICD-10-CM | POA: Diagnosis not present

## 2020-01-10 DIAGNOSIS — B351 Tinea unguium: Secondary | ICD-10-CM | POA: Diagnosis not present

## 2020-01-10 DIAGNOSIS — M79671 Pain in right foot: Secondary | ICD-10-CM | POA: Diagnosis not present

## 2020-01-11 ENCOUNTER — Other Ambulatory Visit: Payer: Self-pay

## 2020-01-11 ENCOUNTER — Ambulatory Visit (INDEPENDENT_AMBULATORY_CARE_PROVIDER_SITE_OTHER): Payer: Medicare Other | Admitting: Physician Assistant

## 2020-01-11 ENCOUNTER — Encounter: Payer: Self-pay | Admitting: Physician Assistant

## 2020-01-11 VITALS — BP 108/62 | HR 48 | Temp 97.7°F | Ht 69.0 in | Wt 183.0 lb

## 2020-01-11 DIAGNOSIS — Z79899 Other long term (current) drug therapy: Secondary | ICD-10-CM | POA: Diagnosis not present

## 2020-01-11 DIAGNOSIS — I10 Essential (primary) hypertension: Secondary | ICD-10-CM

## 2020-01-11 DIAGNOSIS — I25810 Atherosclerosis of coronary artery bypass graft(s) without angina pectoris: Secondary | ICD-10-CM

## 2020-01-11 DIAGNOSIS — E785 Hyperlipidemia, unspecified: Secondary | ICD-10-CM

## 2020-01-11 DIAGNOSIS — I4819 Other persistent atrial fibrillation: Secondary | ICD-10-CM | POA: Diagnosis not present

## 2020-01-11 MED ORDER — LISINOPRIL 20 MG PO TABS: 20.0000 mg | ORAL_TABLET | Freq: Every day | ORAL | 3 refills | Status: DC

## 2020-01-11 MED ORDER — LISINOPRIL 20 MG PO TABS: 20.0000 mg | ORAL_TABLET | Freq: Every day | ORAL | 0 refills | Status: DC

## 2020-01-11 NOTE — Progress Notes (Signed)
Cardiology Office Note:    Date:  01/13/2020   ID:  Kevin Collins, DOB 27-Nov-1930, MRN [id number]  PCP:  Neale Burly, MD  Cardiologist:  Minus Breeding, MD  Electrophysiologist:  None   Referring MD: Neale Burly, MD   Chief Complaint  Patient presents with  . Follow-up    seen for Dr. Percival Spanish    History of Present Illness:    Kevin Collins is a 86 y.o. male with a hx of CAD s/p CABG in 10/1999 (LIMA-LAD, SVG-OM, SVG-diagonal, SVG-PDA/PL), history of bradycardia, PAF, HTN, HLD and peripheral neuropathy.  Myoview in July 2010 showed EF 48%.  Recent echocardiogram obtained on 09/02/2019 showed EF 40 to 45%, basal inferior wall and septal hypokinesis, moderate LAE, trace mitral regurgitation, mild to moderate aortic calcification without evidence of aortic stenosis, mild AI, moderately elevated pulmonary arterial systolic pressure.  Patient underwent diuresis for acute heart failure.  He wore a Holter monitor that showed persistent A. fib with slow heart rate.  He will occasionally had 4-second pauses.  Patient was last seen by Dr. Percival Spanish on 09/28/2019, he was compliant with anticoagulation therapy.  After discussing various options, he was agreeable to proceed with cardioversion.  Lasix was reduced to 200 mg daily.  He eventually underwent successful cardioversion x2 by Dr. Sallyanne Kuster on 10/05/2019.  I last saw the patient on 12/22/2019, at which time he was back in atrial fibrillation.  He did complain of dizzy spell when changing the body position and when looking up.  I spoke with Dr. Percival Spanish, and we decided to discontinue his hydrochlorothiazide to allow his blood pressure to drift a little higher.  His symptom was less consistent with symptomatic bradycardia however more consistent with orthostatic hypotension.  Carotid Doppler showed mild carotid disease bilaterally.  Patient presents today for cardiology office visit.  Heart rate remains low in the 40s.  Other than some mild dizziness  getting up in the morning, he denies any other dizzy spell.  Blood pressure surprisingly borderline low today, I will further reduce lisinopril to 20 mg daily.  With his slow heart rate, I wish to keep his systolic blood pressure around 130 to low 140s range.  As long as he does not have any worsening dizziness, presyncope or syncope, I hope we can avoid a pacemaker.  He does not have significant discomfort while in atrial fibrillation, at this time, I do not recommend another cardioversion.  Given the discontinuation of hydrochlorothiazide, I recommend a repeat basic metabolic panel to check his potassium level since he is still on lisinopril and potassium supplement at the same time.   Past Medical History:  Diagnosis Date  . Bradycardia, unspecified   . CAD (coronary artery disease)    NATIVE WITHOUT ANGINA  . Hereditary and idiopathic neuropathy, unspecified   . History of shingles   . Hyperlipidemia   . Hypertension    ESSENTIAL PRIMARY  . Peripheral neuropathy     Past Surgical History:  Procedure Laterality Date  . CARDIAC CATHETERIZATION Left 09/1999   NORMAL LEFT MAIN, OCCLUDED LAD, OCCLUDED MID CFX, OCCLUDED PROXIMAL RCA, 60% STENOSIS PROXIMAL DIAG 1, RIGHT TO LEFT COLLATERAL, LEFT TO RIGHT COLLATERAL; LVEF OF 48% DOCUMENTED VIA NUCLEAR STUDY ON 07/12/2009.  Marland Kitchen CARDIOVERSION N/A 10/05/2019   Procedure: CARDIOVERSION;  Surgeon: Sanda Klein, MD;  [facility]: [facility];  Service: Cardiovascular;  Laterality: N/A;  . CATARACT EXTRACTION    . CORONARY ARTERY BYPASS GRAFT     w/ LIMA TO LAD, SVG  TO dx, SVG TO OM, SVG TO AM-PD-PL 10/12/99 [facility]  . REMOVAL OF ANKLE PLATE      Current Medications: Current Meds  Medication Sig  . apixaban (ELIQUIS) 5 MG TABS tablet Take 1 tablet (5 mg total) by mouth 2 (two) times daily.  Marland Kitchen ezetimibe (ZETIA) 10 MG tablet Take 1 tablet (10 mg total) by mouth daily.  . furosemide (LASIX) 20 MG tablet Take 1 tablet (20 mg total) by mouth daily.   Marland Kitchen gabapentin (NEURONTIN) 800 MG tablet Take 800 mg by mouth 3 (three) times daily.  . Multiple Vitamins-Minerals (PRESERVISION AREDS) CAPS Take 1 capsule by mouth 2 (two) times daily.  . Omega-3 Fatty Acids (FISH OIL) 1000 MG CAPS Take 1,000 mg by mouth 3 (three) times daily.  Marland Kitchen oxyCODONE-acetaminophen (PERCOCET) 7.5-325 MG tablet Take 1 tablet by mouth 3 (three) times daily.   . potassium chloride SA (KLOR-CON) 20 MEQ tablet Take 1 tablet (20 mEq total) by mouth daily.  . simvastatin (ZOCOR) 80 MG tablet Take 1 tablet (80 mg total) by mouth daily.  . [DISCONTINUED] lisinopril (ZESTRIL) 20 MG tablet Take 1 tablet (20 mg total) by mouth daily.  . [DISCONTINUED] lisinopril (ZESTRIL) 40 MG tablet Take 1 tablet (40 mg total) by mouth daily.     Allergies:   Patient has no known allergies.   Social History   Socioeconomic History  . Marital status: Divorced    Spouse name: Not on file  . Number of children: 1  . Years of education: Not on file  . Highest education level: Not on file  Occupational History  . Occupation: RETIRED  Tobacco Use  . Smoking status: Former Research scientist (life sciences)  . Smokeless tobacco: Never Used  Substance and Sexual Activity  . Alcohol use: Never  . Drug use: Never  . Sexual activity: Not on file  Other Topics Concern  . Not on file  Social History Narrative   His son lives with him.  He is retired Nature conservation officer.     Social Determinants of Health   Financial Resource Strain:   . Difficulty of Paying Living Expenses: Not on file  Food Insecurity:   . Worried About Charity fundraiser in the Last Year: Not on file  . Ran Out of Food in the Last Year: Not on file  Transportation Needs:   . Lack of Transportation (Medical): Not on file  . Lack of Transportation (Non-Medical): Not on file  Physical Activity:   . Days of Exercise per Week: Not on file  . Minutes of Exercise per Session: Not on file  Stress:   . Feeling of Stress : Not on file  Social Connections:   .  Frequency of Communication with Friends and Family: Not on file  . Frequency of Social Gatherings with Friends and Family: Not on file  . Attends Religious Services: Not on file  . Active Member of Clubs or Organizations: Not on file  . Attends Archivist Meetings: Not on file  . Marital Status: Not on file     Family History: The patient's family history includes CVA in his father; Other in his mother.  ROS:   Please see the history of present illness.     All other systems reviewed and are negative.  EKGs/Labs/Other Studies Reviewed:    The following studies were reviewed today:  Echo 09/02/2019 IMPRESSIONS 1. Left ventricular ejection fraction, by visual estimation, is 40 to 45%. The left ventricle has moderately decreased function. Mildly increased  left ventricular size. Left ventricular septal wall thickness was normal. There is mildly increased left  ventricular hypertrophy. 2. Mid and basal inferior wall and septal hypokinesis. 3. Global right ventricle has low normal systolic function.The right ventricular size is mildly enlarged. No increase in right ventricular wall thickness. 4. Left atrial size was moderately dilated. 5. Right atrial size was moderately dilated. 6. The mitral valve is normal in structure. Trace mitral valve regurgitation. 7. The tricuspid valve is normal in structure. Tricuspid valve regurgitation moderate. 8. The aortic valve is normal in structure. Aortic valve regurgitation is mild by color flow Doppler. Mild to moderate aortic valve sclerosis/calcification without any evidence of aortic stenosis. 9. The pulmonic valve was grossly normal. Pulmonic valve regurgitation is mild by color flow Doppler. 10. Moderately elevated pulmonary artery systolic pressure.  EKG:  EKG is ordered today.  The ekg ordered today demonstrates atrial fibrillation with slow ventricular rate, poor R wave progression in anterior leads.  Recent  Labs: 09/02/2019: ALT 18; NT-Pro BNP 1,475; TSH 3.359 09/30/2019: Platelets 122 10/05/2019: Hemoglobin 13.9 01/11/2020: BUN 18; Creatinine, Ser 1.27; Potassium 4.8; Sodium 143  Recent Lipid Panel No results found for: CHOL, TRIG, HDL, CHOLHDL, VLDL, LDLCALC, LDLDIRECT  Physical Exam:    VS:  BP 108/62   Pulse (!) 48   Temp 97.7 F (36.5 C)   Ht 5\' 9"  (1.753 m)   Wt 183 lb (83 kg)   SpO2 93%   BMI 27.02 kg/m     Wt Readings from Last 3 Encounters:  01/11/20 183 lb (83 kg)  12/22/19 179 lb (81.2 kg)  10/05/19 170 lb 12.8 oz (77.5 kg)     GEN: Well nourished, well developed in no acute distress HEENT: Normal NECK: No JVD; No carotid bruits LYMPHATICS: No lymphadenopathy CARDIAC: Irregularly irregular, no murmurs, rubs, gallops RESPIRATORY:  Clear to auscultation without rales, wheezing or rhonchi  ABDOMEN: Soft, non-tender, non-distended MUSCULOSKELETAL:  No edema; No deformity  SKIN: Warm and dry NEUROLOGIC:  Alert and oriented x 3 PSYCHIATRIC:  Normal affect   ASSESSMENT:    1. Persistent atrial fibrillation ([place])   2. Medication management   3. Coronary artery disease involving coronary bypass graft of native heart without angina pectoris   4. Essential hypertension   5. Hyperlipidemia LDL goal <70    PLAN:    In order of problems listed above:  1. Persistent atrial fibrillation: With slow ventricular rate.  Patient underwent cardioversion in October, however had recurrence since then.  Heart rate is in the 40s while in atrial fibrillation however patient does not have any symptom of bradycardia.  We talked about possibility of pacemaker, neither the patient nor I are very enthusiastic about getting a pacemaker especially since he is asymptomatic.  Continue Eliquis  2. CAD s/p CABG: On Zocor.  Not on aspirin given the need for Eliquis.  Denies any significant chest pain  3. Hypertension: Blood pressure is borderline on current therapy, will reduce lisinopril to 20  mg daily to allow blood pressure to drift up.  With bradycardia, I would prefer his systolic blood pressure to be in the 130s and the low 140s range.  4. Hyperlipidemia: On Zetia and Zocor   Medication Adjustments/Labs and Tests Ordered: Current medicines are reviewed at length with the patient today.  Concerns regarding medicines are outlined above.  Orders Placed This Encounter  Procedures  . Basic metabolic panel  . EKG 12-Lead   Meds ordered this encounter  Medications  . DISCONTD:  lisinopril (ZESTRIL) 20 MG tablet    Sig: Take 1 tablet (20 mg total) by mouth daily.    Dispense:  90 tablet    Refill:  3    Patient Instructions  Medication Instructions:   DECREASE Lisinopril to 20 mg daily   *If you need a refill on your cardiac medications before your next appointment, please call your pharmacy*  Lab Work: Your physician recommends that you return for lab work in [place]:   BMET If you have labs (blood work) drawn today and your tests are completely normal, you will receive your results only by: Marland Kitchen MyChart Message (if you have MyChart) OR . A paper copy in the mail If you have any lab test that is abnormal or we need to change your treatment, we will call you to review the results.  Testing/Procedures: NONE ordered at this time of appointment   Follow-Up: At [facility], you and your health needs are our priority.  As part of our continuing mission to provide you with exceptional heart care, we have created designated Provider Care Teams.  These Care Teams include your primary Cardiologist (physician) and Advanced Practice Providers (APPs -  Physician Assistants and Nurse Practitioners) who all work together to provide you with the care you need, when you need it.  Your next appointment:   4 week(s) in the [place] office   The format for your next appointment:   In Person  Provider:   Minus Breeding, MD  Other Instructions      Signed, Almyra Deforest, [place]   01/13/2020 10:53 PM    [place]

## 2020-01-11 NOTE — Patient Instructions (Signed)
Medication Instructions:   DECREASE Lisinopril to 20 mg daily   *If you need a refill on your cardiac medications before your next appointment, please call your pharmacy*  Lab Work: Your physician recommends that you return for lab work in [place]:   BMET If you have labs (blood work) drawn today and your tests are completely normal, you will receive your results only by: Marland Kitchen MyChart Message (if you have MyChart) OR . A paper copy in the mail If you have any lab test that is abnormal or we need to change your treatment, we will call you to review the results.  Testing/Procedures: NONE ordered at this time of appointment   Follow-Up: At [facility], you and your health needs are our priority.  As part of our continuing mission to provide you with exceptional heart care, we have created designated Provider Care Teams.  These Care Teams include your primary Cardiologist (physician) and Advanced Practice Providers (APPs -  Physician Assistants and Nurse Practitioners) who all work together to provide you with the care you need, when you need it.  Your next appointment:   4 week(s) in the [place] office   The format for your next appointment:   In Person  Provider:   Minus Breeding, MD  Other Instructions

## 2020-01-12 LAB — BASIC METABOLIC PANEL
BUN/Creatinine Ratio: 14 (ref 10–24)
BUN: 18 mg/dL (ref 8–27)
CO2: 23 mmol/L (ref 20–29)
Calcium: 10.8 mg/dL — ABNORMAL HIGH (ref 8.6–10.2)
Chloride: 105 mmol/L (ref 96–106)
Creatinine, Ser: 1.27 mg/dL (ref 0.76–1.27)
GFR calc Af Amer: 58 mL/min/{1.73_m2} — ABNORMAL LOW (ref >59)
GFR calc non Af Amer: 50 mL/min/{1.73_m2} — ABNORMAL LOW (ref >59)
Glucose: 105 mg/dL — ABNORMAL HIGH (ref 65–99)
Potassium: 4.8 mmol/L (ref 3.5–5.2)
Sodium: 143 mmol/L (ref 134–144)

## 2020-01-13 ENCOUNTER — Encounter: Payer: Self-pay | Admitting: Physician Assistant

## 2020-02-06 NOTE — Progress Notes (Signed)
Cardiology Office Note   Date:  02/08/2020   ID:  Kevin Collins, DOB 1930/10/20, MRN [id number]  PCP:  Neale Burly, MD  Cardiologist:   Minus Breeding, MD   Chief Complaint  Patient presents with  . Atrial Fibrillation      History of Present Illness: Kevin Collins is a 86 y.o. male with a history of CAD s/p CABG in 10/1999 (LIMA-LAD, SVG-OM, SVG-diagonal, SVG-PDA/PL), history of bradycardia, atrial fib, HTN, HLD and peripheral neuropathy and EF 40 to 45%.  He has had cardioversion in 09/2019.  However, he went back into atrial fib.   He had some dizziness and low BPs recently so I reduced his lisinopril.   He does get some orthostatic dizziness first thing in the morning when he wakes up but he feels well with this conservatively. The patient denies any new symptoms such as chest discomfort, neck or arm discomfort. There has been no new shortness of breath, PND or orthopnea. There have been no reported palpitations, presyncope or syncope.   Past Medical History:  Diagnosis Date  . Bradycardia, unspecified   . CAD (coronary artery disease)    NATIVE WITHOUT ANGINA  . Hereditary and idiopathic neuropathy, unspecified   . History of shingles   . Hyperlipidemia   . Hypertension    ESSENTIAL PRIMARY  . Peripheral neuropathy     Past Surgical History:  Procedure Laterality Date  . CARDIAC CATHETERIZATION Left 09/1999   NORMAL LEFT MAIN, OCCLUDED LAD, OCCLUDED MID CFX, OCCLUDED PROXIMAL RCA, 60% STENOSIS PROXIMAL DIAG 1, RIGHT TO LEFT COLLATERAL, LEFT TO RIGHT COLLATERAL; LVEF OF 48% DOCUMENTED VIA NUCLEAR STUDY ON 07/12/2009.  Marland Kitchen CARDIOVERSION N/A 10/05/2019   Procedure: CARDIOVERSION;  Surgeon: Sanda Klein, MD;  Location: [facility];  Service: Cardiovascular;  Laterality: N/A;  . CATARACT EXTRACTION    . CORONARY ARTERY BYPASS GRAFT     w/ LIMA TO LAD, SVG TO dx, SVG TO OM, SVG TO AM-PD-PL 10/12/99 [facility]  . REMOVAL OF ANKLE PLATE       Current Outpatient  Medications  Medication Sig Dispense Refill  . apixaban (ELIQUIS) 5 MG TABS tablet Take 1 tablet (5 mg total) by mouth 2 (two) times daily. 180 tablet 0  . ezetimibe (ZETIA) 10 MG tablet Take 1 tablet (10 mg total) by mouth daily. 90 tablet 3  . furosemide (LASIX) 20 MG tablet Take 1 tablet (20 mg total) by mouth daily. 90 tablet 3  . gabapentin (NEURONTIN) 800 MG tablet Take 800 mg by mouth 3 (three) times daily.    Marland Kitchen lisinopril (ZESTRIL) 20 MG tablet Take 1 tablet (20 mg total) by mouth daily. 30 tablet 0  . Multiple Vitamins-Minerals (PRESERVISION AREDS) CAPS Take 1 capsule by mouth 2 (two) times daily.    . Omega-3 Fatty Acids (FISH OIL) 1000 MG CAPS Take 1,000 mg by mouth 3 (three) times daily.    Marland Kitchen oxyCODONE-acetaminophen (PERCOCET) 7.5-325 MG tablet Take 1 tablet by mouth 3 (three) times daily.     . potassium chloride SA (KLOR-CON) 20 MEQ tablet Take 1 tablet (20 mEq total) by mouth daily. 90 tablet 3  . simvastatin (ZOCOR) 80 MG tablet Take 1 tablet (80 mg total) by mouth daily. 90 tablet 3   No current facility-administered medications for this visit.    Allergies:   Patient has no known allergies.    ROS:  Please see the history of present illness.   Otherwise, review of systems are positive for none.  All other systems are reviewed and negative.    PHYSICAL EXAM: VS:  BP 118/62   Pulse 63   Ht 5\' 8"  (1.727 m)   Wt 185 lb (83.9 kg)   BMI 28.13 kg/m  , BMI Body mass index is 28.13 kg/m. GENERAL:  Well appearing NECK:  No jugular venous distention, waveform within normal limits, carotid upstroke brisk and symmetric, no bruits, no thyromegaly LUNGS:  Clear to auscultation bilaterally CHEST:  Unremarkable HEART:  PMI not displaced or sustained,S1 and S2 within normal limits, no S3,  no clicks, no rubs, slight apical systolic short murmur, no diastolic murmurs, irregular ABD:  Flat, positive bowel sounds normal in frequency in pitch, no bruits, no rebound, no guarding, no  midline pulsatile mass, no hepatomegaly, no splenomegaly, irregular EXT:  2 plus pulses throughout, no edema, no cyanosis no clubbing    EKG:  EKG is ordered today. The ekg ordered today demonstrates atrial fibrillation, rate 63, intraventricular conduction delay, left axis deviation, left anterior fascicular block, probable old anteroseptal infarct.  No change from previous.   Recent Labs: 09/02/2019: ALT 18; NT-Pro BNP 1,475; TSH 3.359 09/30/2019: Platelets 122 10/05/2019: Hemoglobin 13.9 01/11/2020: BUN 18; Creatinine, Ser 1.27; Potassium 4.8; Sodium 143    Lipid Panel No results found for: CHOL, TRIG, HDL, CHOLHDL, VLDL, LDLCALC, LDLDIRECT    Wt Readings from Last 3 Encounters:  02/08/20 185 lb (83.9 kg)  01/11/20 183 lb (83 kg)  12/22/19 179 lb (81.2 kg)      Other studies Reviewed: Additional studies/ records that were reviewed today include: Labs. Review of the above records demonstrates:  Please see elsewhere in the note.     ASSESSMENT AND PLAN:   Persistent atrial fibrillation:   He tolerates this rhythm with no problems with anticoagulation reasonable rate control.  No change in therapy.   CAD s/p CABG:   He has had no chest discomfort.  No change in therapy.  Hypertension:   Blood pressure is borderline and he has not tolerated upward med titration.  No change in therapy.   Cardiomyopathy: He has a mildly reduced ejection fraction of 45% but has not tolerated med titration.  No change in therapy.  Hyperlipidemia:   He is on Zetia and Zocor.  I will defer management to Hasanaj, Samul Dada, MD   Covid education: He is trying to call around to get his vaccine schedule.   Current medicines are reviewed at length with the patient today.  The patient does not have concerns regarding medicines.  The following changes have been made:  no change  Labs/ tests ordered today include: None  Orders Placed This Encounter  Procedures  . EKG 12-Lead     Disposition:    FU with me in one year.     Signed, Minus Breeding, MD  02/08/2020 10:53 AM    [facility]

## 2020-02-07 DIAGNOSIS — I480 Paroxysmal atrial fibrillation: Secondary | ICD-10-CM | POA: Diagnosis not present

## 2020-02-07 DIAGNOSIS — N182 Chronic kidney disease, stage 2 (mild): Secondary | ICD-10-CM | POA: Diagnosis not present

## 2020-02-07 DIAGNOSIS — M545 Low back pain: Secondary | ICD-10-CM | POA: Diagnosis not present

## 2020-02-07 DIAGNOSIS — Z0000 Encounter for general adult medical examination without abnormal findings: Secondary | ICD-10-CM | POA: Diagnosis not present

## 2020-02-07 DIAGNOSIS — I5032 Chronic diastolic (congestive) heart failure: Secondary | ICD-10-CM | POA: Diagnosis not present

## 2020-02-07 DIAGNOSIS — G603 Idiopathic progressive neuropathy: Secondary | ICD-10-CM | POA: Diagnosis not present

## 2020-02-07 DIAGNOSIS — M10262 Drug-induced gout, left knee: Secondary | ICD-10-CM | POA: Diagnosis not present

## 2020-02-07 DIAGNOSIS — Z1389 Encounter for screening for other disorder: Secondary | ICD-10-CM | POA: Diagnosis not present

## 2020-02-08 ENCOUNTER — Other Ambulatory Visit: Payer: Self-pay

## 2020-02-08 ENCOUNTER — Encounter: Payer: Self-pay | Admitting: Cardiology

## 2020-02-08 ENCOUNTER — Ambulatory Visit (INDEPENDENT_AMBULATORY_CARE_PROVIDER_SITE_OTHER): Payer: Medicare Other | Admitting: Cardiology

## 2020-02-08 VITALS — BP 118/62 | HR 63 | Ht 68.0 in | Wt 185.0 lb

## 2020-02-08 DIAGNOSIS — I2510 Atherosclerotic heart disease of native coronary artery without angina pectoris: Secondary | ICD-10-CM

## 2020-02-08 DIAGNOSIS — E785 Hyperlipidemia, unspecified: Secondary | ICD-10-CM | POA: Diagnosis not present

## 2020-02-08 DIAGNOSIS — I10 Essential (primary) hypertension: Secondary | ICD-10-CM

## 2020-02-08 DIAGNOSIS — I25810 Atherosclerosis of coronary artery bypass graft(s) without angina pectoris: Secondary | ICD-10-CM

## 2020-02-08 DIAGNOSIS — Z7189 Other specified counseling: Secondary | ICD-10-CM

## 2020-02-08 DIAGNOSIS — I4819 Other persistent atrial fibrillation: Secondary | ICD-10-CM

## 2020-02-08 NOTE — Patient Instructions (Signed)
Medication Instructions:  The current medical regimen is effective;  continue present plan and medications.  *If you need a refill on your cardiac medications before your next appointment, please call your pharmacy*  Follow-Up: At [facility], you and your health needs are our priority.  As part of our continuing mission to provide you with exceptional heart care, we have created designated Provider Care Teams.  These Care Teams include your primary Cardiologist (physician) and Advanced Practice Providers (APPs -  Physician Assistants and Nurse Practitioners) who all work together to provide you with the care you need, when you need it.  Your next appointment:   12 month(s)  The format for your next appointment:   In Person  Provider:   James Hochrein, MD  Thank you for choosing [facility]!!     

## 2020-02-28 DIAGNOSIS — Z23 Encounter for immunization: Secondary | ICD-10-CM | POA: Diagnosis not present

## 2020-03-07 DIAGNOSIS — M545 Low back pain: Secondary | ICD-10-CM | POA: Diagnosis not present

## 2020-03-20 DIAGNOSIS — M79671 Pain in right foot: Secondary | ICD-10-CM | POA: Diagnosis not present

## 2020-03-20 DIAGNOSIS — L84 Corns and callosities: Secondary | ICD-10-CM | POA: Diagnosis not present

## 2020-03-20 DIAGNOSIS — B351 Tinea unguium: Secondary | ICD-10-CM | POA: Diagnosis not present

## 2020-03-20 DIAGNOSIS — G609 Hereditary and idiopathic neuropathy, unspecified: Secondary | ICD-10-CM | POA: Diagnosis not present

## 2020-03-22 NOTE — Telephone Encounter (Signed)
Left voicemail for patient and patient's son to call back to schedule an appointment with an APP next week per Dr. Percival Spanish.

## 2020-03-27 DIAGNOSIS — Z23 Encounter for immunization: Secondary | ICD-10-CM | POA: Diagnosis not present

## 2020-03-29 DIAGNOSIS — I426 Alcoholic cardiomyopathy: Secondary | ICD-10-CM | POA: Insufficient documentation

## 2020-03-29 NOTE — Progress Notes (Signed)
Cardiology Office Note   Date:  03/30/2020   ID:  Kevin Collins, DOB 28-Feb-1930, MRN [id number]  PCP:  Neale Burly, MD  Cardiologist:   Minus Breeding, MD   No chief complaint on file.     History of Present Illness: Kevin Collins is a 86 y.o. male with a history of CAD s/p CABG in 10/1999 (LIMA-LAD, SVG-OM, SVG-diagonal, SVG-PDA/PL), history of bradycardia, atrial fib, HTN, HLD and peripheral neuropathy and EF 40 to 45%.  He has had cardioversion in 09/2019.  However, he went back into atrial fib.     We had to reduce his medications over time because of some renal insufficiency and some lower blood pressures with dizziness.  Reducing his lisinopril and his diuretic really did not make a big difference with his dizziness.  He is having increased lower extremity swelling.  He is also had some increased abdominal girth.  He gets decreased exercise tolerance but he is not really describing shortness of breath with exertion.  He just gets fatigued.  He is not describing new PND or orthopnea though he has hospital bed that he puts up sometimes just because he is more comfortable occasionally sleeping up.  He does not sleep well.  He is fatigued.  He is not describing chest pressure, neck or arm discomfort.   Past Medical History:  Diagnosis Date  . Bradycardia, unspecified   . CAD (coronary artery disease)    NATIVE WITHOUT ANGINA  . Hereditary and idiopathic neuropathy, unspecified   . History of shingles   . Hyperlipidemia   . Hypertension    ESSENTIAL PRIMARY  . Peripheral neuropathy     Past Surgical History:  Procedure Laterality Date  . CARDIAC CATHETERIZATION Left 09/1999   NORMAL LEFT MAIN, OCCLUDED LAD, OCCLUDED MID CFX, OCCLUDED PROXIMAL RCA, 60% STENOSIS PROXIMAL DIAG 1, RIGHT TO LEFT COLLATERAL, LEFT TO RIGHT COLLATERAL; LVEF OF 48% DOCUMENTED VIA NUCLEAR STUDY ON 07/12/2009.  Marland Kitchen CARDIOVERSION N/A 10/05/2019   Procedure: CARDIOVERSION;  Surgeon: Sanda Klein, MD;   Location: [facility];  Service: Cardiovascular;  Laterality: N/A;  . CATARACT EXTRACTION    . CORONARY ARTERY BYPASS GRAFT     w/ LIMA TO LAD, SVG TO dx, SVG TO OM, SVG TO AM-PD-PL 10/12/99 [facility]  . REMOVAL OF ANKLE PLATE       Current Outpatient Medications  Medication Sig Dispense Refill  . apixaban (ELIQUIS) 5 MG TABS tablet Take 1 tablet (5 mg total) by mouth 2 (two) times daily. 180 tablet 0  . ezetimibe (ZETIA) 10 MG tablet Take 1 tablet (10 mg total) by mouth daily. 90 tablet 3  . furosemide (LASIX) 20 MG tablet Take 1 tablet (20 mg total) by mouth daily. 90 tablet 3  . gabapentin (NEURONTIN) 800 MG tablet Take 800 mg by mouth 3 (three) times daily.    Marland Kitchen lisinopril (ZESTRIL) 20 MG tablet Take 1 tablet (20 mg total) by mouth daily. 30 tablet 0  . Multiple Vitamins-Minerals (PRESERVISION AREDS) CAPS Take 1 capsule by mouth 2 (two) times daily.    . Omega-3 Fatty Acids (FISH OIL) 1000 MG CAPS Take 1,000 mg by mouth 3 (three) times daily.    Marland Kitchen oxyCODONE-acetaminophen (PERCOCET) 7.5-325 MG tablet Take 1 tablet by mouth 3 (three) times daily.     . potassium chloride SA (KLOR-CON) 20 MEQ tablet Take 1 tablet (20 mEq total) by mouth daily. 90 tablet 3  . simvastatin (ZOCOR) 80 MG tablet Take 1 tablet (80 mg  total) by mouth daily. 90 tablet 3  . spironolactone (ALDACTONE) 25 MG tablet Take 1 tablet (25 mg total) by mouth daily. 90 tablet 3   No current facility-administered medications for this visit.    Allergies:   Patient has no known allergies.    ROS:  Please see the history of present illness.   Otherwise, review of systems are positive for decreased sleep.   All other systems are reviewed and negative.    PHYSICAL EXAM: VS:  BP 136/64   Pulse (!) 45   Ht 5\' 9"  (1.753 m)   Wt 182 lb (82.6 kg)   SpO2 94%   BMI 26.88 kg/m  , BMI Body mass index is 26.88 kg/m. GENERAL:  Well appearing NECK:  Positive jugular venous distention with CV wave, waveform within normal  limits, carotid upstroke brisk and symmetric, no bruits, no thyromegaly LUNGS:  Clear to auscultation bilaterally CHEST:  Unremarkable HEART:  PMI not displaced or sustained,S1 and S2 within normal limits, no S3, no S4, no clicks, no rubs, 3/6 holosystolic murmur heard at the fourth left intercostal space, no diastolic murmurs ABD:  Flat, positive bowel sounds normal in frequency in pitch, no bruits, no rebound, no guarding, no midline pulsatile mass, no hepatomegaly, no splenomegaly, probably ascites with fluid wave EXT:  2 plus pulses throughout, mild bilateral ankle edema, no cyanosis no clubbing  EKG:  EKG is  ordered today. The ekg ordered today demonstrates atrial fibrillation, rate 45, intraventricular conduction delay, left axis deviation, left anterior fascicular block, probable old anteroseptal infarct.  No change from previous.   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  He did not increase his heart rate with brisk ambulation around the office today which may be contributing to some of his decreased exercise tolerance.  However, not sure he would do well with ventricular pacing alone particularly with his mildly reduced ejection fraction.  He probably needs BiV pacing have any benefit.  He had talked about this and he may be inclined to be more conservative in this.  He does  tolerate anticoagulation.   CAD s/p CABG:   He has no chest pain.  I am not planning an ischemia evaluation.   Hypertension:   The blood pressure is well controlled but I will be changing his medicines as described below.  Acute on chronic systolic and diastolic heart failure:   He appears to have ascites on his exam.  He has some lower extremity swelling that is mild.  He has jugular venous distention with a CV wave.  He has had some mildly elevated pulmonary pressures in the past.  I suspect he has got more TR, elevated pulmonary pressures hepatic congestion and ascites.  He is not in overt distress or particularly uncomfortable.  I am going to add spironolactone 25 mg daily and check a basic metabolic profile  and BNP today.  I will check a basic metabolic profile in a week.  He is going to continue to restrict salt and fluid.  I will see him back soon after the studies  AI:  This was mild last year.  I will have a low threshold to repeat echocardiography though this was done in the fall of last year.  Bradycardia: As above.  Hyperlipidemia:    No change in therapy.  Covid education:   He has been vaccinated.   Current medicines are reviewed at length with the patient today.  The patient does not have concerns regarding medicines.  The following changes have been made:  As above  Labs/ tests ordered today include:      Orders Placed This Encounter  Procedures  . [place] ASCITES (ABDOMEN LIMITED)  . Basic metabolic panel  . Brain natriuretic peptide  . Basic metabolic panel  . EKG 12-Lead     Disposition:   FU with me in [place] in one month.      Signed, Minus Breeding, MD  03/30/2020 2:14 PM    [facility]

## 2020-03-30 ENCOUNTER — Ambulatory Visit (INDEPENDENT_AMBULATORY_CARE_PROVIDER_SITE_OTHER): Payer: Medicare Other | Admitting: Cardiology

## 2020-03-30 ENCOUNTER — Encounter: Payer: Self-pay | Admitting: Cardiology

## 2020-03-30 ENCOUNTER — Other Ambulatory Visit: Payer: Self-pay

## 2020-03-30 VITALS — BP 136/64 | HR 45 | Ht 69.0 in | Wt 182.0 lb

## 2020-03-30 DIAGNOSIS — Z79899 Other long term (current) drug therapy: Secondary | ICD-10-CM

## 2020-03-30 DIAGNOSIS — E785 Hyperlipidemia, unspecified: Secondary | ICD-10-CM

## 2020-03-30 DIAGNOSIS — I2510 Atherosclerotic heart disease of native coronary artery without angina pectoris: Secondary | ICD-10-CM | POA: Diagnosis not present

## 2020-03-30 DIAGNOSIS — I426 Alcoholic cardiomyopathy: Secondary | ICD-10-CM | POA: Diagnosis not present

## 2020-03-30 DIAGNOSIS — I10 Essential (primary) hypertension: Secondary | ICD-10-CM | POA: Diagnosis not present

## 2020-03-30 DIAGNOSIS — R188 Other ascites: Secondary | ICD-10-CM

## 2020-03-30 DIAGNOSIS — R0602 Shortness of breath: Secondary | ICD-10-CM | POA: Diagnosis not present

## 2020-03-30 DIAGNOSIS — I4819 Other persistent atrial fibrillation: Secondary | ICD-10-CM

## 2020-03-30 DIAGNOSIS — I25810 Atherosclerosis of coronary artery bypass graft(s) without angina pectoris: Secondary | ICD-10-CM

## 2020-03-30 DIAGNOSIS — Z7189 Other specified counseling: Secondary | ICD-10-CM

## 2020-03-30 MED ORDER — SPIRONOLACTONE 25 MG PO TABS: 25.0000 mg | ORAL_TABLET | Freq: Every day | ORAL | 3 refills | Status: DC

## 2020-03-30 NOTE — Patient Instructions (Signed)
Medication Instructions:  START SPIRONOLACTONE 25MG  DAILY *If you need a refill on your cardiac medications before your next appointment, please call your pharmacy*  Lab Work: Your physician recommends that you return for lab work (BMP, BNP) Your physician recommends that you return for lab work in 1 week (BMP)  If you have labs (blood work) drawn today and your tests are completely normal, you will receive your results only by: Marland Kitchen MyChart Message (if you have MyChart) OR . A paper copy in the mail If you have any lab test that is abnormal or we need to change your treatment, we will call you to review the results.  Testing/Procedures: ABDOMINAL US  Follow-Up: At [facility], you and your health needs are our priority.  As part of our continuing mission to provide you with exceptional heart care, we have created designated Provider Care Teams.  These Care Teams include your primary Cardiologist (physician) and Advanced Practice Providers (APPs -  Physician Assistants and Nurse Practitioners) who all work together to provide you with the care you need, when you need it.  Your next appointment:   1 month(s) IN [place]  The format for your next appointment:   In Person  Provider:   Minus Breeding, MD

## 2020-04-02 ENCOUNTER — Other Ambulatory Visit: Payer: Medicare Other

## 2020-04-02 ENCOUNTER — Other Ambulatory Visit: Payer: Self-pay

## 2020-04-02 DIAGNOSIS — I5032 Chronic diastolic (congestive) heart failure: Secondary | ICD-10-CM | POA: Diagnosis not present

## 2020-04-02 DIAGNOSIS — Z79899 Other long term (current) drug therapy: Secondary | ICD-10-CM | POA: Diagnosis not present

## 2020-04-03 ENCOUNTER — Encounter (HOSPITAL_COMMUNITY): Payer: Self-pay

## 2020-04-03 ENCOUNTER — Ambulatory Visit (HOSPITAL_COMMUNITY)
Admission: RE | Admit: 2020-04-03 | Discharge: 2020-04-03 | Disposition: A | Discharge status: Home / Self Care | Payer: Medicare Other | Source: Ambulatory Visit | Attending: Cardiology | Admitting: Cardiology

## 2020-04-03 ENCOUNTER — Encounter (INDEPENDENT_AMBULATORY_CARE_PROVIDER_SITE_OTHER): Payer: Medicare Other | Admitting: Ophthalmology

## 2020-04-03 ENCOUNTER — Ambulatory Visit (HOSPITAL_COMMUNITY): Admission: RE | Admit: 2020-04-03 | Payer: Medicare Other | Source: Ambulatory Visit

## 2020-04-03 DIAGNOSIS — R188 Other ascites: Secondary | ICD-10-CM | POA: Diagnosis not present

## 2020-04-09 DIAGNOSIS — Z79899 Other long term (current) drug therapy: Secondary | ICD-10-CM | POA: Diagnosis not present

## 2020-04-09 DIAGNOSIS — I5032 Chronic diastolic (congestive) heart failure: Secondary | ICD-10-CM | POA: Diagnosis not present

## 2020-04-10 ENCOUNTER — Other Ambulatory Visit: Payer: Self-pay

## 2020-04-10 MED ORDER — SPIRONOLACTONE 25 MG PO TABS: 25.0000 mg | ORAL_TABLET | Freq: Every day | ORAL | 3 refills | Status: DC

## 2020-04-26 DIAGNOSIS — I5032 Chronic diastolic (congestive) heart failure: Secondary | ICD-10-CM | POA: Diagnosis not present

## 2020-04-26 DIAGNOSIS — G603 Idiopathic progressive neuropathy: Secondary | ICD-10-CM | POA: Diagnosis not present

## 2020-04-26 DIAGNOSIS — M545 Low back pain: Secondary | ICD-10-CM | POA: Diagnosis not present

## 2020-04-26 DIAGNOSIS — N182 Chronic kidney disease, stage 2 (mild): Secondary | ICD-10-CM | POA: Diagnosis not present

## 2020-04-26 DIAGNOSIS — I480 Paroxysmal atrial fibrillation: Secondary | ICD-10-CM | POA: Diagnosis not present

## 2020-05-07 DIAGNOSIS — I351 Nonrheumatic aortic (valve) insufficiency: Secondary | ICD-10-CM | POA: Insufficient documentation

## 2020-05-07 DIAGNOSIS — R001 Bradycardia, unspecified: Secondary | ICD-10-CM | POA: Insufficient documentation

## 2020-05-07 DIAGNOSIS — I5043 Acute on chronic combined systolic (congestive) and diastolic (congestive) heart failure: Secondary | ICD-10-CM | POA: Insufficient documentation

## 2020-05-07 NOTE — Progress Notes (Signed)
Cardiology Office Note   Date:  05/09/2020   ID:  Kevin Collins, DOB 05-Apr-1930, MRN [id number]  PCP:  Neale Burly, MD  Cardiologist:   Minus Breeding, MD   Chief Complaint  Patient presents with  . Shortness of Breath      History of Present Illness: Kevin Collins is a 86 y.o. male with a history of CAD s/p CABG in 10/1999 (LIMA-LAD, SVG-OM, SVG-diagonal, SVG-PDA/PL), history of bradycardia, atrial fib, HTN, HLD and peripheral neuropathy and EF 40 to 45%.  He has had cardioversion in 09/2019.  However, he went back into atrial fib.     At the last visit he had increased edema and SOB.  I thought that he had ascites but this was only very mild on ultrasound.  I treated him with spironolactone.  He had stable creatinine and potassium on follow up.  He did have follow-up labs which I reviewed.  He thinks he has less swelling and less shortness of breath.  He is doing pretty well.  He denies any acute shortness of breath and is not describing any new dizziness.  His dizziness really happens when he tries to look up in an airplane while he is walking.  He is not describing orthostatic symptoms.  Past Medical History:  Diagnosis Date  . Bradycardia, unspecified   . CAD (coronary artery disease)    NATIVE WITHOUT ANGINA  . Hereditary and idiopathic neuropathy, unspecified   . History of shingles   . Hyperlipidemia   . Hypertension    ESSENTIAL PRIMARY  . Peripheral neuropathy     Past Surgical History:  Procedure Laterality Date  . CARDIAC CATHETERIZATION Left 09/1999   NORMAL LEFT MAIN, OCCLUDED LAD, OCCLUDED MID CFX, OCCLUDED PROXIMAL RCA, 60% STENOSIS PROXIMAL DIAG 1, RIGHT TO LEFT COLLATERAL, LEFT TO RIGHT COLLATERAL; LVEF OF 48% DOCUMENTED VIA NUCLEAR STUDY ON 07/12/2009.  Marland Kitchen CARDIOVERSION N/A 10/05/2019   Procedure: CARDIOVERSION;  Surgeon: Sanda Klein, MD;  Location: [facility];  Service: Cardiovascular;  Laterality: N/A;  . CATARACT EXTRACTION    . CORONARY ARTERY  BYPASS GRAFT     w/ LIMA TO LAD, SVG TO dx, SVG TO OM, SVG TO AM-PD-PL 10/12/99 [facility]  . REMOVAL OF ANKLE PLATE       Current Outpatient Medications  Medication Sig Dispense Refill  . apixaban (ELIQUIS) 5 MG TABS tablet Take 1 tablet (5 mg total) by mouth 2 (two) times daily. 180 tablet 0  . ezetimibe (ZETIA) 10 MG tablet Take 1 tablet (10 mg total) by mouth daily. 90 tablet 3  . furosemide (LASIX) 20 MG tablet Take 1 tablet (20 mg total) by mouth daily. 90 tablet 3  . gabapentin (NEURONTIN) 800 MG tablet Take 800 mg by mouth 3 (three) times daily.    Marland Kitchen lisinopril (ZESTRIL) 20 MG tablet Take 1 tablet (20 mg total) by mouth daily. 30 tablet 0  . Multiple Vitamins-Minerals (PRESERVISION AREDS) CAPS Take 1 capsule by mouth 2 (two) times daily.    Marland Kitchen oxyCODONE-acetaminophen (PERCOCET) 7.5-325 MG tablet Take 1 tablet by mouth 3 (three) times daily.     . simvastatin (ZOCOR) 80 MG tablet Take 1 tablet (80 mg total) by mouth daily. 90 tablet 3  . spironolactone (ALDACTONE) 25 MG tablet Take 1 tablet (25 mg total) by mouth daily. 90 tablet 3  . Omega-3 Fatty Acids (FISH OIL) 1000 MG CAPS Take 1,000 mg by mouth 3 (three) times daily.     No current facility-administered medications  for this visit.    Allergies:   Patient has no known allergies.    ROS:  Please see the history of present illness.   Otherwise, review of systems are positive for none.   All other systems are reviewed and negative.    PHYSICAL EXAM: VS:  BP 110/62   Pulse (!) 52   Ht 5\' 9"  (1.753 m)   Wt 175 lb (79.4 kg)   BMI 25.84 kg/m  , BMI Body mass index is 25.84 kg/m. GENERAL:  Well appearing NECK:  No jugular venous distention, waveform within normal limits, carotid upstroke brisk and symmetric, no bruits, no thyromegaly LUNGS:  Clear to auscultation bilaterally CHEST:  Unremarkable HEART:  PMI not displaced or sustained,S1 and S2 within normal limits, no S3,  no clicks, no rubs, 3 out of 6 holosystolic murmur  heard at the fourth left intercostal space, no diastolic murmurs, irregular ABD:  Flat, positive bowel sounds normal in frequency in pitch, no bruits, no rebound, no guarding, no midline pulsatile mass, no hepatomegaly, no splenomegaly EXT:  2 plus pulses throughout, no edema, no cyanosis no clubbing  EKG:  EKG is not ordered today.   Recent Labs: 09/02/2019: ALT 18; NT-Pro BNP 1,475; TSH 3.359 09/30/2019: Platelets 122 10/05/2019: Hemoglobin 13.9 01/11/2020: BUN 18; Creatinine, Ser 1.27; Potassium 4.8; Sodium 143    Lipid Panel No results found for: CHOL, TRIG, HDL, CHOLHDL, VLDL, LDLCALC, LDLDIRECT    Wt Readings from Last 3 Encounters:  05/09/20 175 lb (79.4 kg)  03/30/20 182 lb (82.6 kg)  02/08/20 185 lb (83.9 kg)      Other studies Reviewed: Additional studies/ records that were reviewed today include:  Labs Review of the above records demonstrates:  NA   ASSESSMENT AND PLAN:   Persistent atrial fibrillation:     The patient has no new symptoms associated with this.  We talked about potential BiV pacing in the past as I thought he might have some tacky bradycardia syndrome but he really wanted to be conservative and is not particularly symptomatic today.  Therefore, no change in therapy.   CAD s/p CABG:    He is having no active chest discomfort.  No change in therapy.  Hypertension:   The blood pressure is at target.  He will continue the meds as listed.   Acute on chronic systolic and diastolic heart failure:   He seems to be doing better from a volume standpoint did not have significant ascites.  Therefore, no change in therapy.  He will continue the spironolactone and I will check a basic metabolic profile in 1 month.  AI:  This was mild last year.  I will follow this clinically.    Hyperlipidemia:   Total cholesterol is 194 with an HDL 41.  No change in therapy.  Covid education:   He has been vaccinated.   Current medicines are reviewed at length with the  patient today.  The patient does not have concerns regarding medicines.  The following changes have been made:  None  Labs/ tests ordered today include:    BMET in one month.   No orders of the defined types were placed in this encounter.    Disposition:   FU with me in six months.    Signed, Minus Breeding, MD  05/09/2020 11:38 AM    [facility]

## 2020-05-09 ENCOUNTER — Encounter: Payer: Self-pay | Admitting: Cardiology

## 2020-05-09 ENCOUNTER — Other Ambulatory Visit: Payer: Self-pay

## 2020-05-09 ENCOUNTER — Ambulatory Visit (INDEPENDENT_AMBULATORY_CARE_PROVIDER_SITE_OTHER): Payer: Medicare Other | Admitting: Cardiology

## 2020-05-09 VITALS — BP 110/62 | HR 52 | Ht 69.0 in | Wt 175.0 lb

## 2020-05-09 DIAGNOSIS — I25810 Atherosclerosis of coronary artery bypass graft(s) without angina pectoris: Secondary | ICD-10-CM | POA: Diagnosis not present

## 2020-05-09 DIAGNOSIS — E785 Hyperlipidemia, unspecified: Secondary | ICD-10-CM

## 2020-05-09 DIAGNOSIS — I5043 Acute on chronic combined systolic (congestive) and diastolic (congestive) heart failure: Secondary | ICD-10-CM

## 2020-05-09 DIAGNOSIS — I2510 Atherosclerotic heart disease of native coronary artery without angina pectoris: Secondary | ICD-10-CM

## 2020-05-09 DIAGNOSIS — R001 Bradycardia, unspecified: Secondary | ICD-10-CM | POA: Diagnosis not present

## 2020-05-09 DIAGNOSIS — I4819 Other persistent atrial fibrillation: Secondary | ICD-10-CM

## 2020-05-09 DIAGNOSIS — I351 Nonrheumatic aortic (valve) insufficiency: Secondary | ICD-10-CM | POA: Diagnosis not present

## 2020-05-09 DIAGNOSIS — I10 Essential (primary) hypertension: Secondary | ICD-10-CM

## 2020-05-09 NOTE — Patient Instructions (Signed)
Medication Instructions:  The current medical regimen is effective;  continue present plan and medications.  *If you need a refill on your cardiac medications before your next appointment, please call your pharmacy*  Lab Work: Please have blood work in 1 month at Dr Joselyn Arrow office (BMP) If you have labs (blood work) drawn today and your tests are completely normal, you will receive your results only by: Marland Kitchen MyChart Message (if you have MyChart) OR . A paper copy in the mail If you have any lab test that is abnormal or we need to change your treatment, we will call you to review the results.  Follow-Up: At [facility], you and your health needs are our priority.  As part of our continuing mission to provide you with exceptional heart care, we have created designated Provider Care Teams.  These Care Teams include your primary Cardiologist (physician) and Advanced Practice Providers (APPs -  Physician Assistants and Nurse Practitioners) who all work together to provide you with the care you need, when you need it.  We recommend signing up for the patient portal called "MyChart".  Sign up information is provided on this After Visit Summary.  MyChart is used to connect with patients for Virtual Visits (Telemedicine).  Patients are able to view lab/test results, encounter notes, upcoming appointments, etc.  Non-urgent messages can be sent to your provider as well.   To learn more about what you can do with MyChart, go to [email or web address].    Your next appointment:   6 month(s)  The format for your next appointment:   In Person  Provider:   Minus Breeding, MD   Thank you for choosing [facility]!!

## 2020-05-18 ENCOUNTER — Other Ambulatory Visit: Payer: Self-pay | Admitting: *Deleted

## 2020-05-18 ENCOUNTER — Other Ambulatory Visit: Payer: Self-pay | Admitting: Cardiology

## 2020-05-18 MED ORDER — LISINOPRIL 20 MG PO TABS: 20.0000 mg | ORAL_TABLET | Freq: Every day | ORAL | 2 refills | Status: DC

## 2020-05-22 DIAGNOSIS — L84 Corns and callosities: Secondary | ICD-10-CM | POA: Diagnosis not present

## 2020-05-22 DIAGNOSIS — G609 Hereditary and idiopathic neuropathy, unspecified: Secondary | ICD-10-CM | POA: Diagnosis not present

## 2020-05-22 DIAGNOSIS — M79671 Pain in right foot: Secondary | ICD-10-CM | POA: Diagnosis not present

## 2020-05-22 DIAGNOSIS — B351 Tinea unguium: Secondary | ICD-10-CM | POA: Diagnosis not present

## 2020-06-23 ENCOUNTER — Other Ambulatory Visit: Payer: Self-pay | Admitting: Cardiology

## 2020-07-07 DIAGNOSIS — L03115 Cellulitis of right lower limb: Secondary | ICD-10-CM | POA: Diagnosis not present

## 2020-07-24 DIAGNOSIS — M79671 Pain in right foot: Secondary | ICD-10-CM | POA: Diagnosis not present

## 2020-07-24 DIAGNOSIS — L97511 Non-pressure chronic ulcer of other part of right foot limited to breakdown of skin: Secondary | ICD-10-CM | POA: Diagnosis not present

## 2020-07-30 ENCOUNTER — Other Ambulatory Visit: Payer: Self-pay | Admitting: Cardiology

## 2020-07-30 DIAGNOSIS — I480 Paroxysmal atrial fibrillation: Secondary | ICD-10-CM | POA: Diagnosis not present

## 2020-07-30 DIAGNOSIS — I5032 Chronic diastolic (congestive) heart failure: Secondary | ICD-10-CM | POA: Diagnosis not present

## 2020-07-30 DIAGNOSIS — L570 Actinic keratosis: Secondary | ICD-10-CM | POA: Diagnosis not present

## 2020-07-30 DIAGNOSIS — G603 Idiopathic progressive neuropathy: Secondary | ICD-10-CM | POA: Diagnosis not present

## 2020-07-30 DIAGNOSIS — I4891 Unspecified atrial fibrillation: Secondary | ICD-10-CM

## 2020-07-30 DIAGNOSIS — M545 Low back pain: Secondary | ICD-10-CM | POA: Diagnosis not present

## 2020-07-30 DIAGNOSIS — N182 Chronic kidney disease, stage 2 (mild): Secondary | ICD-10-CM | POA: Diagnosis not present

## 2020-07-31 IMAGING — US US ABDOMEN LIMITED
1 series · 7 of 7 positions shown · non-contrast
Comparison: None.

CLINICAL DATA: Ascites.

EXAM:
LIMITED ABDOMEN ULTRASOUND FOR ASCITES
TECHNIQUE: Limited ultrasound survey for ascites was performed in all four
abdominal quadrants.

[Series 1: us abdomen limited · 7 of 7 slices shown]
[im 1/7]
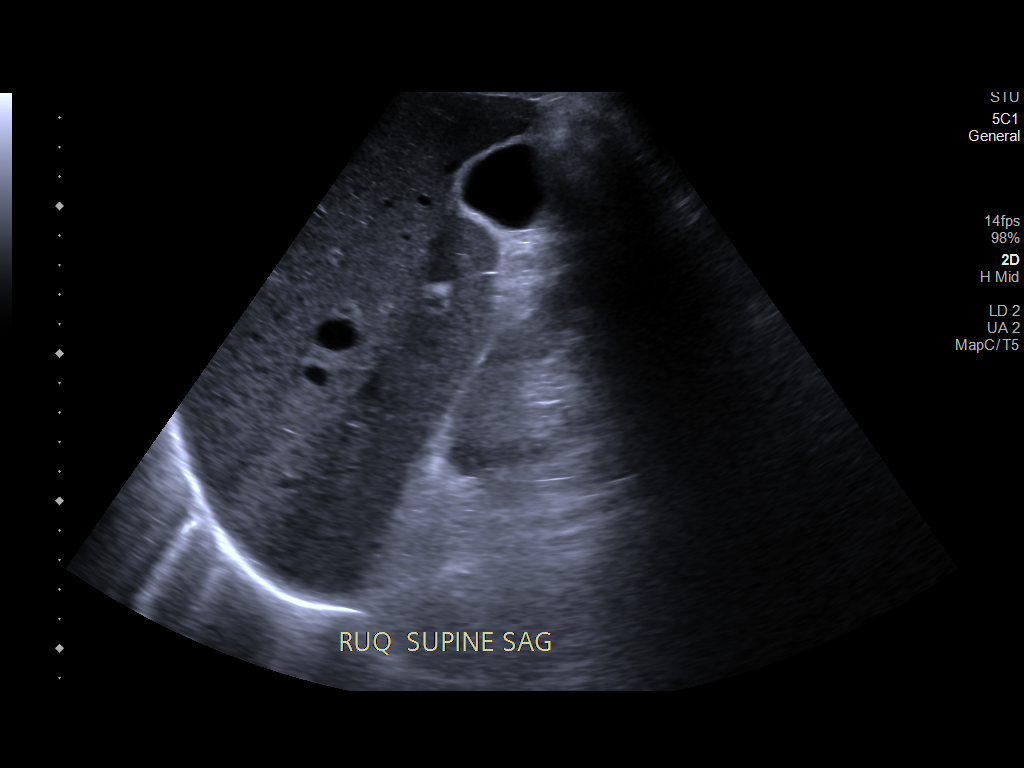
[im 2/7]
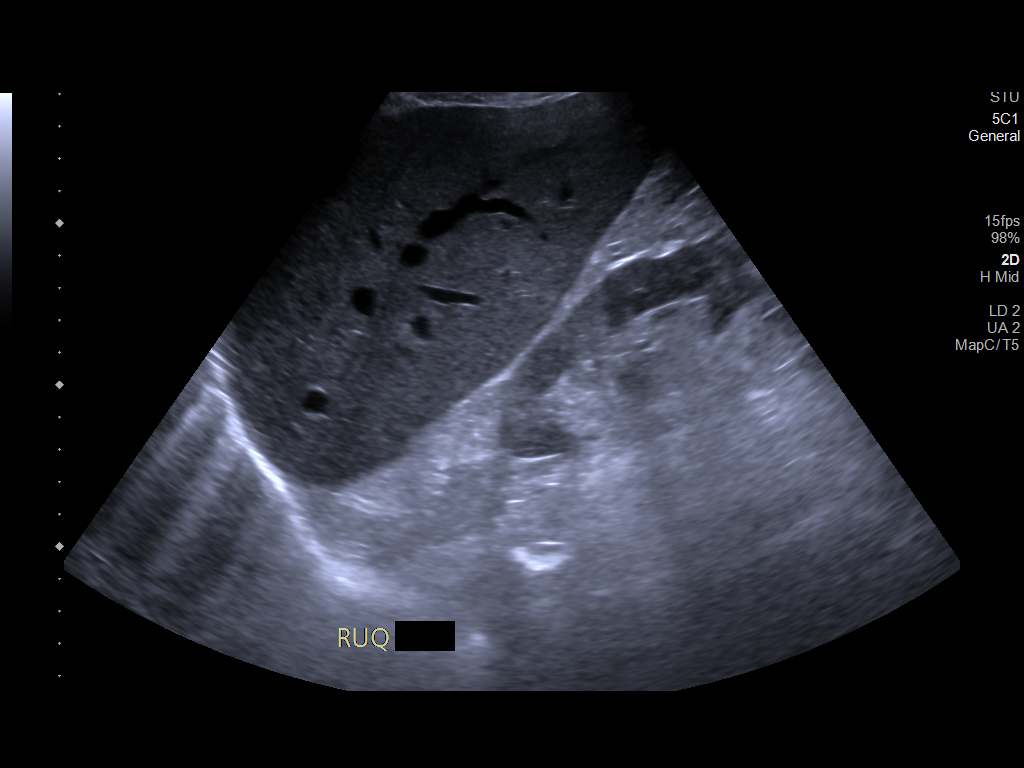
[im 3/7]
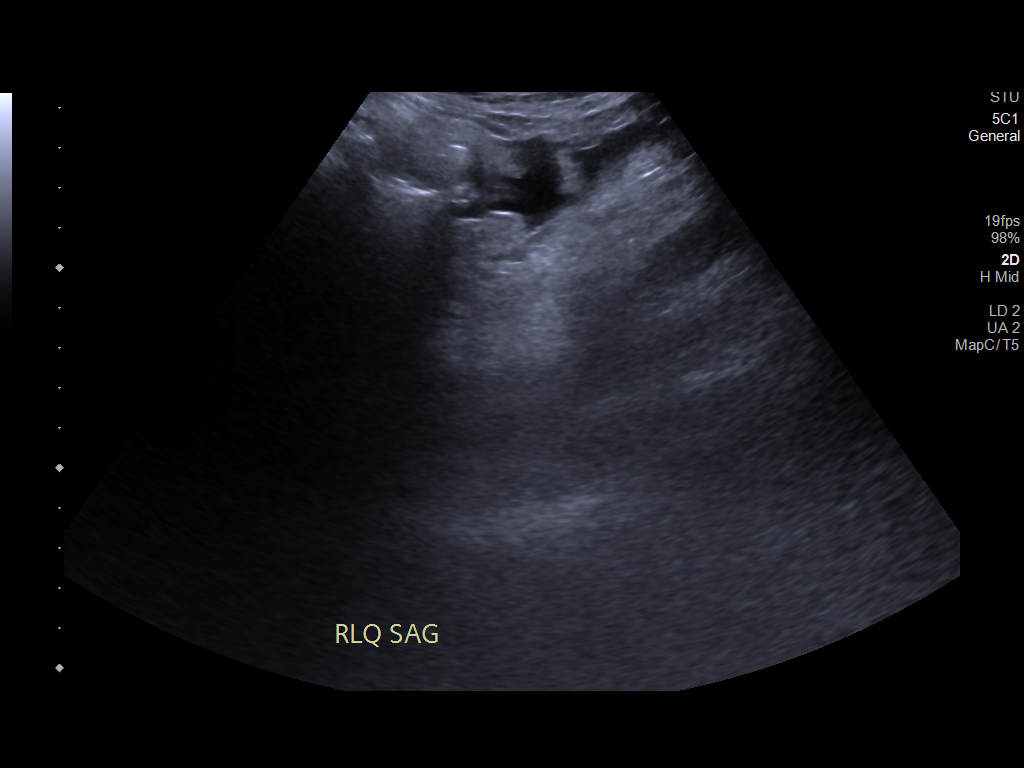
[im 4/7]
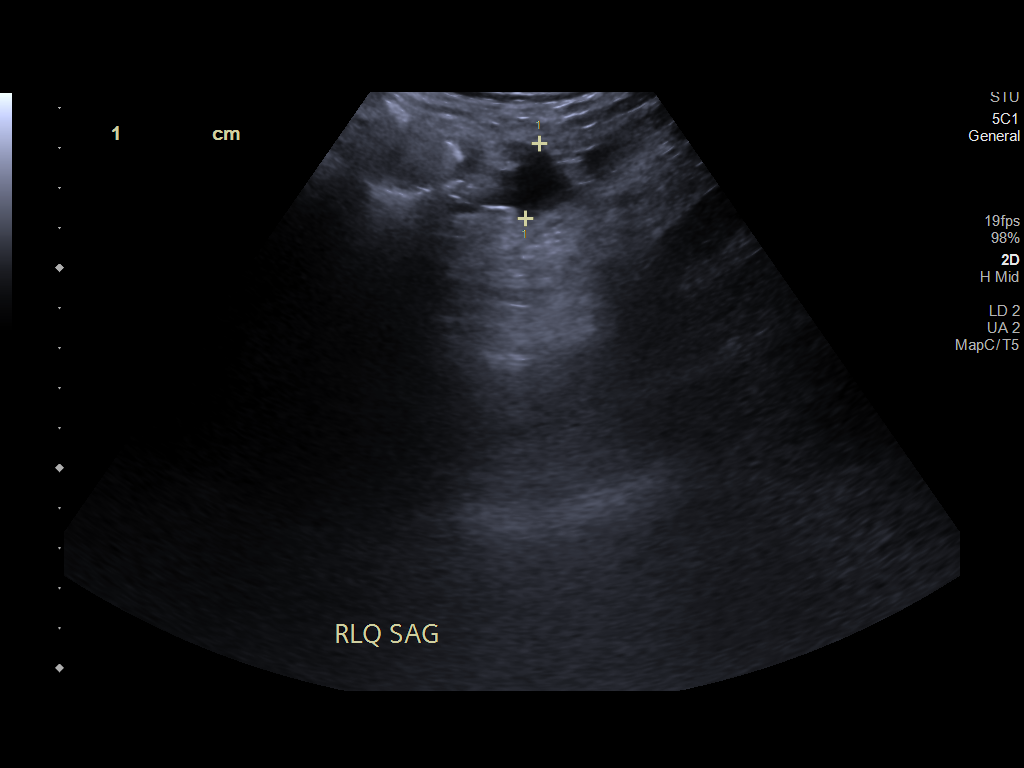
[im 5/7]
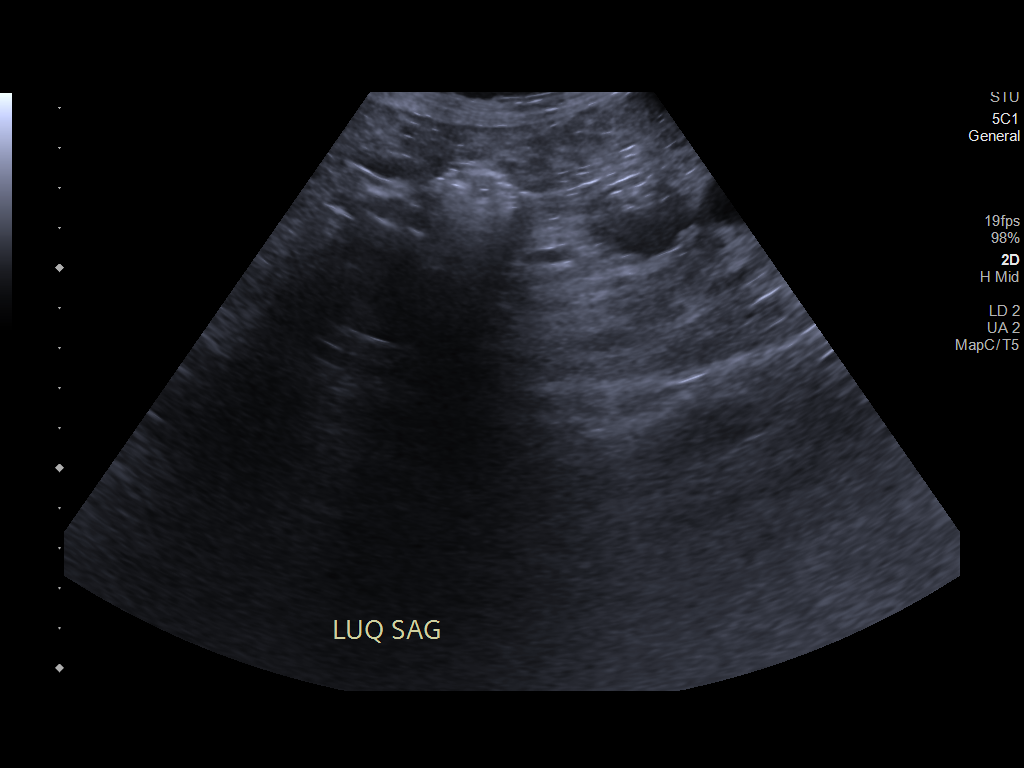
[im 6/7]
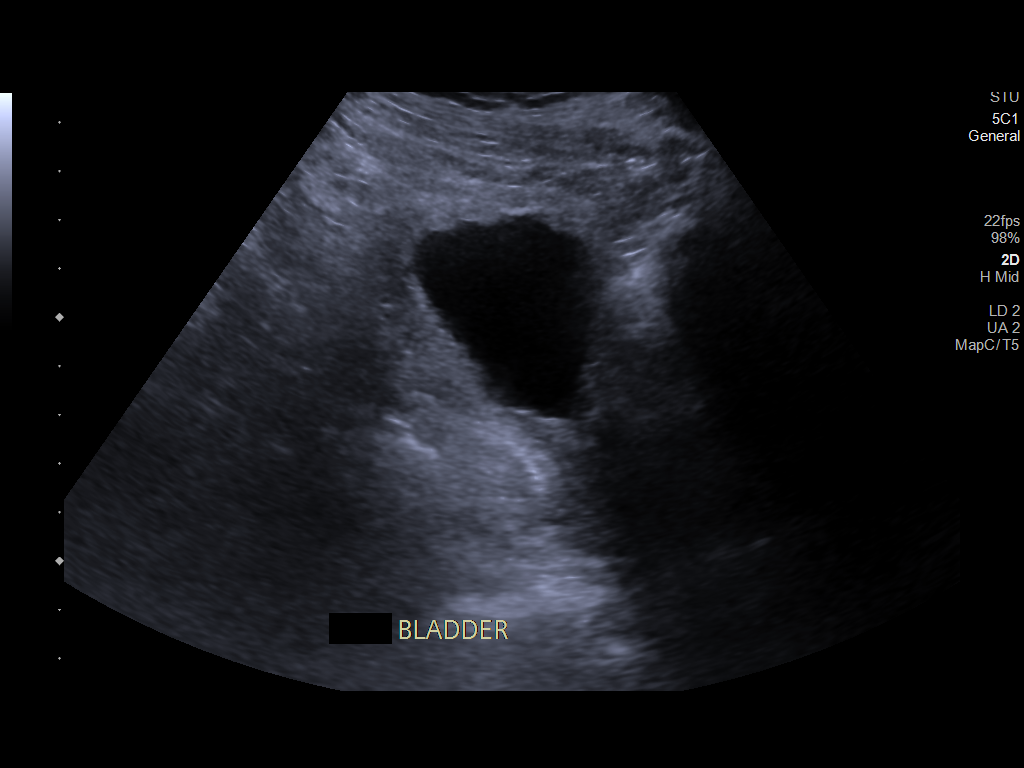
[im 7/7]
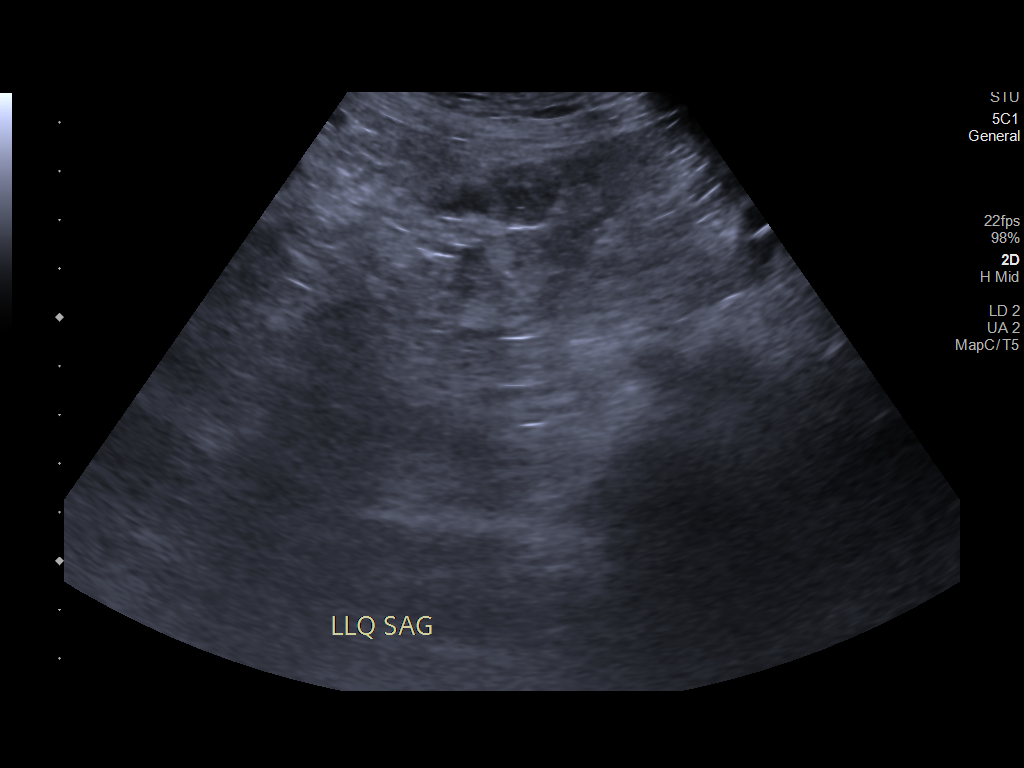

[7 of 7 positions shown; findings below may reference images not displayed]

FINDINGS: Sonographic survey to assess for ascites. Minimal free fluid noted
in the right lower quadrant, largest pocket measuring 1.9 cm in
depth.
IMPRESSION: Minimal ascites in the right lower quadrant, largest pocket
measuring 1.9 cm in depth.

## 2020-08-21 DIAGNOSIS — L97511 Non-pressure chronic ulcer of other part of right foot limited to breakdown of skin: Secondary | ICD-10-CM | POA: Diagnosis not present

## 2020-09-18 DIAGNOSIS — M722 Plantar fascial fibromatosis: Secondary | ICD-10-CM | POA: Diagnosis not present

## 2020-09-18 DIAGNOSIS — M79672 Pain in left foot: Secondary | ICD-10-CM | POA: Diagnosis not present

## 2020-10-04 ENCOUNTER — Ambulatory Visit (INDEPENDENT_AMBULATORY_CARE_PROVIDER_SITE_OTHER): Payer: Medicare Other | Admitting: Gastroenterology

## 2020-10-12 ENCOUNTER — Other Ambulatory Visit: Payer: Self-pay | Admitting: Cardiology

## 2020-10-12 DIAGNOSIS — R188 Other ascites: Secondary | ICD-10-CM

## 2020-10-12 DIAGNOSIS — Z79899 Other long term (current) drug therapy: Secondary | ICD-10-CM

## 2020-10-16 MED ORDER — FUROSEMIDE 20 MG PO TABS: 40.0000 mg | ORAL_TABLET | Freq: Every day | ORAL | 3 refills | Status: DC

## 2020-10-17 NOTE — Addendum Note (Signed)
Addended by: Fidel Levy on: 10/17/2020 08:04 AM   Modules accepted: Orders

## 2020-10-23 DIAGNOSIS — L97511 Non-pressure chronic ulcer of other part of right foot limited to breakdown of skin: Secondary | ICD-10-CM | POA: Diagnosis not present

## 2020-10-24 ENCOUNTER — Encounter: Payer: Self-pay | Admitting: Cardiology

## 2020-10-24 DIAGNOSIS — Z79899 Other long term (current) drug therapy: Secondary | ICD-10-CM | POA: Diagnosis not present

## 2020-10-24 DIAGNOSIS — I480 Paroxysmal atrial fibrillation: Secondary | ICD-10-CM | POA: Diagnosis not present

## 2020-10-24 DIAGNOSIS — I5032 Chronic diastolic (congestive) heart failure: Secondary | ICD-10-CM | POA: Diagnosis not present

## 2020-10-24 DIAGNOSIS — N182 Chronic kidney disease, stage 2 (mild): Secondary | ICD-10-CM | POA: Diagnosis not present

## 2020-10-30 DIAGNOSIS — Z6826 Body mass index (BMI) 26.0-26.9, adult: Secondary | ICD-10-CM | POA: Diagnosis not present

## 2020-10-30 DIAGNOSIS — I480 Paroxysmal atrial fibrillation: Secondary | ICD-10-CM | POA: Diagnosis not present

## 2020-10-30 DIAGNOSIS — I5032 Chronic diastolic (congestive) heart failure: Secondary | ICD-10-CM | POA: Diagnosis not present

## 2020-10-30 DIAGNOSIS — N182 Chronic kidney disease, stage 2 (mild): Secondary | ICD-10-CM | POA: Diagnosis not present

## 2020-10-30 DIAGNOSIS — G603 Idiopathic progressive neuropathy: Secondary | ICD-10-CM | POA: Diagnosis not present

## 2020-11-06 ENCOUNTER — Telehealth: Payer: Self-pay | Admitting: Cardiology

## 2020-11-06 NOTE — Telephone Encounter (Signed)
Patient has been in contact with Dr. Joselyn Arrow office in regarding to having labwork sent to our offices for Dr. Percival Spanish. Patient is calling in now to follow up on his MyChart message to see if we have received anything yet. Please call/advise.   Thank you!

## 2020-11-06 NOTE — Telephone Encounter (Signed)
See 11/23 phone note.

## 2020-11-06 NOTE — Telephone Encounter (Signed)
Spoke with patient. Patient informed no lab work has been received yet. Patient verbalized understanding. Patient adamant he call PCP office himself.

## 2020-11-07 DIAGNOSIS — N182 Chronic kidney disease, stage 2 (mild): Secondary | ICD-10-CM | POA: Diagnosis not present

## 2020-11-13 DIAGNOSIS — L97511 Non-pressure chronic ulcer of other part of right foot limited to breakdown of skin: Secondary | ICD-10-CM | POA: Diagnosis not present

## 2020-11-19 NOTE — Progress Notes (Signed)
Cardiology Office Note   Date:  11/21/2020   ID:  Kevin Collins, DOB [birth year], MRN [id number]  PCP:  Neale Burly, MD  Cardiologist:   Minus Breeding, MD   No chief complaint on file.     History of Present Illness: Kevin Collins is a 86 y.o. male with a history of CAD s/p CABG in 10/1999 (LIMA-LAD, SVG-OM, SVG-diagonal, SVG-PDA/PL), history of bradycardia, atrial fib, HTN, HLD and peripheral neuropathy and EF 40 to 45%.  He has had cardioversion in 09/2019.  However, he went back into atrial fib.   Since I last saw him he has done well.  He does not have the exercise tolerance he did when he was a younger man but he is not describing any acute changes.  He has not had symptomatic bradycardia arrhythmias.  He has not had any syncope or presyncope.  He does not notice any palpitations.  He has had no new shortness of breath, PND or orthopnea.  He has some chronic left leg swelling.   Past Medical History:  Diagnosis Date  . Bradycardia, unspecified   . CAD (coronary artery disease)    NATIVE WITHOUT ANGINA  . Hereditary and idiopathic neuropathy, unspecified   . History of shingles   . Hyperlipidemia   . Hypertension    ESSENTIAL PRIMARY  . Peripheral neuropathy     Past Surgical History:  Procedure Laterality Date  . CARDIAC CATHETERIZATION Left 09/1999   NORMAL LEFT MAIN, OCCLUDED LAD, OCCLUDED MID CFX, OCCLUDED PROXIMAL RCA, 60% STENOSIS PROXIMAL DIAG 1, RIGHT TO LEFT COLLATERAL, LEFT TO RIGHT COLLATERAL; LVEF OF 48% DOCUMENTED VIA NUCLEAR STUDY ON 07/12/2009.  Marland Kitchen CARDIOVERSION N/A 10/05/2019   Procedure: CARDIOVERSION;  Surgeon: Sanda Klein, MD;  Location: [facility];  Service: Cardiovascular;  Laterality: N/A;  . CATARACT EXTRACTION    . CORONARY ARTERY BYPASS GRAFT     w/ LIMA TO LAD, SVG TO dx, SVG TO OM, SVG TO AM-PD-PL 10/12/99 [facility]  . REMOVAL OF ANKLE PLATE       Current Outpatient Medications  Medication Sig Dispense Refill  . cinacalcet  (SENSIPAR) 90 MG tablet Take 1 tablet by mouth daily.    Marland Kitchen ELIQUIS 5 MG TABS tablet TAKE 1 TABLET TWICE A DAY 180 tablet 2  . ezetimibe (ZETIA) 10 MG tablet TAKE 1 TABLET DAILY 90 tablet 3  . furosemide (LASIX) 20 MG tablet Take 2 tablets (40 mg total) by mouth daily. 90 tablet 3  . gabapentin (NEURONTIN) 800 MG tablet Take 800 mg by mouth 3 (three) times daily.    Marland Kitchen lisinopril (ZESTRIL) 20 MG tablet Take 1 tablet (20 mg total) by mouth daily. 90 tablet 2  . Multiple Vitamins-Minerals (PRESERVISION AREDS) CAPS Take 1 capsule by mouth 2 (two) times daily.    . Omega-3 Fatty Acids (FISH OIL) 1000 MG CAPS Take 1,000 mg by mouth 3 (three) times daily.    Marland Kitchen oxyCODONE-acetaminophen (PERCOCET) 7.5-325 MG tablet Take 1 tablet by mouth 3 (three) times daily.     . simvastatin (ZOCOR) 80 MG tablet TAKE 1 TABLET DAILY 90 tablet 2  . spironolactone (ALDACTONE) 25 MG tablet Take 1 tablet (25 mg total) by mouth daily. 90 tablet 3   No current facility-administered medications for this visit.    Allergies:   Patient has no known allergies.    ROS:  Please see the history of present illness.   Otherwise, review of systems are positive for none.   All other systems  are reviewed and negative.    PHYSICAL EXAM: VS:  BP (!) 110/52   Pulse (!) 50   Ht 5\' 9"  (1.753 m)   Wt 177 lb 12.8 oz (80.6 kg)   SpO2 98%   BMI 26.26 kg/m  , BMI Body mass index is 26.26 kg/m. GENERAL:  Well appearing NECK:  No jugular venous distention, waveform within normal limits, carotid upstroke brisk and symmetric, no bruits, no thyromegaly LUNGS:  Clear to auscultation bilaterally CHEST:  Well healed sternotomy scar. HEART:  PMI not displaced or sustained,S1 and S2 within normal limits, no S3, no clicks, no rubs, no murmurs, irregular ABD:  Flat, positive bowel sounds normal in frequency in pitch, no bruits, no rebound, no guarding, no midline pulsatile mass, no hepatomegaly, no splenomegaly EXT:  2 plus pulses throughout,  moderate edema, no cyanosis no clubbing   EKG:  EKG is  ordered today. Atrial fibrillation, left axis deviation, poor anterior R wave progression, no acute ST-T wave changes.  Recent Labs: 01/11/2020: BUN 18; Creatinine, Ser 1.27; Potassium 4.8; Sodium 143    Lipid Panel No results found for: CHOL, TRIG, HDL, CHOLHDL, VLDL, LDLCALC, LDLDIRECT    Wt Readings from Last 3 Encounters:  11/21/20 177 lb 12.8 oz (80.6 kg)  05/09/20 175 lb (79.4 kg)  03/30/20 182 lb (82.6 kg)      Other studies Reviewed: Additional studies/ records that were reviewed today include:  Labs Review of the above records demonstrates: See elsewhere   ASSESSMENT AND PLAN:   Persistent atrial fibrillation:     He tolerates this.  He has a slower rate but is not having any symptoms related to this.  He tolerates anticoagulation.    The patient has no new symptoms associated with this.  We talked about potential BiV pacing in the past as I thought he might have some tacky bradycardia syndrome but he really wanted to be conservative and is not particularly symptomatic today.  Therefore, no change in therapy.   CAD s/p CABG:    Mr. Kevin Collins has a CHA2DS2 - VASc score of 4.   He is having no active chest discomfort.  No change in therapy.  Hypertension:   The blood pressure is at target.  No change in therapy.   Acute on chronic systolic and diastolic heart failure:   He seems to be euvolemic.  I added spironolactone at the last appointment.  His blood pressure really will not allow med titration.  I did check a basic metabolic profile he has done well and tolerated this.  No change in therapy.   AI:  This was mild last year in 2020.  I will follow this clinically.  Hyperlipidemia:   I do not have the most recent readings and I will defer to Hasanaj, Samul Dada, MD with a goal LDL less than 70.     Current medicines are reviewed at length with the patient today.  The patient does not have concerns regarding  medicines.  The following changes have been made: None  Labs/ tests ordered today include:   None  Orders Placed This Encounter  Procedures  . EKG 12-Lead     Disposition:   FU with me in 12 months.    Signed, Minus Breeding, MD  11/21/2020 10:39 AM    [facility]

## 2020-11-21 ENCOUNTER — Ambulatory Visit (INDEPENDENT_AMBULATORY_CARE_PROVIDER_SITE_OTHER): Payer: Medicare Other | Admitting: Cardiology

## 2020-11-21 ENCOUNTER — Encounter: Payer: Self-pay | Admitting: Cardiology

## 2020-11-21 ENCOUNTER — Other Ambulatory Visit: Payer: Self-pay

## 2020-11-21 VITALS — BP 110/52 | HR 50 | Ht 69.0 in | Wt 177.8 lb

## 2020-11-21 DIAGNOSIS — I25810 Atherosclerosis of coronary artery bypass graft(s) without angina pectoris: Secondary | ICD-10-CM

## 2020-11-21 DIAGNOSIS — I10 Essential (primary) hypertension: Secondary | ICD-10-CM | POA: Diagnosis not present

## 2020-11-21 DIAGNOSIS — I5043 Acute on chronic combined systolic (congestive) and diastolic (congestive) heart failure: Secondary | ICD-10-CM | POA: Diagnosis not present

## 2020-11-21 DIAGNOSIS — I2510 Atherosclerotic heart disease of native coronary artery without angina pectoris: Secondary | ICD-10-CM | POA: Diagnosis not present

## 2020-11-21 DIAGNOSIS — I4819 Other persistent atrial fibrillation: Secondary | ICD-10-CM | POA: Diagnosis not present

## 2020-11-21 NOTE — Patient Instructions (Signed)
Medication Instructions:  The current medical regimen is effective;  continue present plan and medications.  *If you need a refill on your cardiac medications before your next appointment, please call your pharmacy*  Follow-Up: At [facility], you and your health needs are our priority.  As part of our continuing mission to provide you with exceptional heart care, we have created designated Provider Care Teams.  These Care Teams include your primary Cardiologist (physician) and Advanced Practice Providers (APPs -  Physician Assistants and Nurse Practitioners) who all work together to provide you with the care you need, when you need it.  We recommend signing up for the patient portal called "MyChart".  Sign up information is provided on this After Visit Summary.  MyChart is used to connect with patients for Virtual Visits (Telemedicine).  Patients are able to view lab/test results, encounter notes, upcoming appointments, etc.  Non-urgent messages can be sent to your provider as well.   To learn more about what you can do with MyChart, go to [email or web address].    Your next appointment:   12 month(s)  The format for your next appointment:   In Person  Provider:   James Hochrein, MD   Thank you for choosing [facility]!!     

## 2020-12-04 DIAGNOSIS — L97511 Non-pressure chronic ulcer of other part of right foot limited to breakdown of skin: Secondary | ICD-10-CM | POA: Diagnosis not present

## 2021-01-10 ENCOUNTER — Other Ambulatory Visit: Payer: Self-pay | Admitting: Physician Assistant

## 2021-01-31 DIAGNOSIS — I5032 Chronic diastolic (congestive) heart failure: Secondary | ICD-10-CM | POA: Diagnosis not present

## 2021-01-31 DIAGNOSIS — G603 Idiopathic progressive neuropathy: Secondary | ICD-10-CM | POA: Diagnosis not present

## 2021-01-31 DIAGNOSIS — I480 Paroxysmal atrial fibrillation: Secondary | ICD-10-CM | POA: Diagnosis not present

## 2021-01-31 DIAGNOSIS — N189 Chronic kidney disease, unspecified: Secondary | ICD-10-CM | POA: Diagnosis not present

## 2021-01-31 DIAGNOSIS — N182 Chronic kidney disease, stage 2 (mild): Secondary | ICD-10-CM | POA: Diagnosis not present

## 2021-01-31 DIAGNOSIS — Z6827 Body mass index (BMI) 27.0-27.9, adult: Secondary | ICD-10-CM | POA: Diagnosis not present

## 2021-02-18 ENCOUNTER — Other Ambulatory Visit: Payer: Self-pay | Admitting: Cardiology

## 2021-03-01 ENCOUNTER — Other Ambulatory Visit: Payer: Self-pay | Admitting: Cardiology

## 2021-04-26 ENCOUNTER — Telehealth: Payer: Self-pay | Admitting: Cardiology

## 2021-04-26 MED ORDER — SIMVASTATIN 80 MG PO TABS: 80.0000 mg | ORAL_TABLET | Freq: Every day | ORAL | 3 refills | Status: DC

## 2021-04-26 NOTE — Telephone Encounter (Signed)
Pt c/o medication issue: 1. Name of Medication: Simvastatin  2. How are you currently taking this medication (dosage and times per day)? Once aday 3. Are you having a reaction (difficulty breathing--STAT)?  no 4. What is your medication issue? Patient calling stat that he would like to get 90 days on his refill and not 60 days his insurance charges him the same. Please advise. Patient received 60 instead of the 90 he gets

## 2021-04-26 NOTE — Telephone Encounter (Signed)
Detailed message left on mailbox stating updated refill has been [facility].

## 2021-05-02 DIAGNOSIS — I5032 Chronic diastolic (congestive) heart failure: Secondary | ICD-10-CM | POA: Diagnosis not present

## 2021-05-02 DIAGNOSIS — N182 Chronic kidney disease, stage 2 (mild): Secondary | ICD-10-CM | POA: Diagnosis not present

## 2021-05-02 DIAGNOSIS — I480 Paroxysmal atrial fibrillation: Secondary | ICD-10-CM | POA: Diagnosis not present

## 2021-05-02 DIAGNOSIS — L84 Corns and callosities: Secondary | ICD-10-CM | POA: Diagnosis not present

## 2021-05-02 DIAGNOSIS — G603 Idiopathic progressive neuropathy: Secondary | ICD-10-CM | POA: Diagnosis not present

## 2021-05-31 ENCOUNTER — Other Ambulatory Visit: Payer: Self-pay | Admitting: Cardiology

## 2021-06-17 ENCOUNTER — Other Ambulatory Visit: Payer: Self-pay | Admitting: Cardiology

## 2021-06-24 ENCOUNTER — Other Ambulatory Visit: Payer: Self-pay | Admitting: Cardiology

## 2021-06-24 DIAGNOSIS — I4891 Unspecified atrial fibrillation: Secondary | ICD-10-CM

## 2021-06-24 NOTE — Telephone Encounter (Signed)
70m, 80.6kg, scr 1.2 11/03/20, lovw/hochrein 11/21/20

## 2021-08-13 DIAGNOSIS — Z0000 Encounter for general adult medical examination without abnormal findings: Secondary | ICD-10-CM | POA: Diagnosis not present

## 2021-08-13 DIAGNOSIS — I5032 Chronic diastolic (congestive) heart failure: Secondary | ICD-10-CM | POA: Diagnosis not present

## 2021-08-13 DIAGNOSIS — I480 Paroxysmal atrial fibrillation: Secondary | ICD-10-CM | POA: Diagnosis not present

## 2021-08-13 DIAGNOSIS — L84 Corns and callosities: Secondary | ICD-10-CM | POA: Diagnosis not present

## 2021-08-13 DIAGNOSIS — Z1331 Encounter for screening for depression: Secondary | ICD-10-CM | POA: Diagnosis not present

## 2021-08-13 DIAGNOSIS — G603 Idiopathic progressive neuropathy: Secondary | ICD-10-CM | POA: Diagnosis not present

## 2021-08-13 DIAGNOSIS — N182 Chronic kidney disease, stage 2 (mild): Secondary | ICD-10-CM | POA: Diagnosis not present

## 2021-08-26 DIAGNOSIS — M7050 Other bursitis of knee, unspecified knee: Secondary | ICD-10-CM | POA: Diagnosis not present

## 2021-09-19 ENCOUNTER — Other Ambulatory Visit: Payer: Self-pay | Admitting: Cardiology

## 2021-09-19 ENCOUNTER — Telehealth: Payer: Self-pay | Admitting: Cardiology

## 2021-09-19 NOTE — Telephone Encounter (Signed)
Patient with diagnosis of afib on Eliquis for anticoagulation.    Procedure: surgical extraction of teeth #21 and #22 Date of procedure: TBD  This indicates a 7.2% annual risk of stroke. The patient's score is based upon: CHF History: 1 HTN History: 1 Diabetes History: 0 Stroke History: 0 Vascular Disease History: 1 Age Score: 2 Gender Score: 0   CrCl 85mL/min  Patient does not require pre-op antibiotics for dental procedure.  Typically don't recommend holding anticoagulation for 1-2 dental extractions, however pt would be at acceptable risk to hold Eliquis for 1 day prior to extractions if preferred by dentist.

## 2021-09-19 NOTE — Telephone Encounter (Signed)
Covering preop today. Generally would not hold Eliquis for extractions but in the event it is required due to being a surgical extraction, will route to pharm team. Of note, no indication for SBE ppx identified at this time.

## 2021-09-19 NOTE — Telephone Encounter (Signed)
    [facility] Pre-operative Risk Assessment    Request for surgical clearance:  What type of surgery is being performed? SURGICAL EXTRACTION OF TEETH #21 #22  When is this surgery scheduled? TBD  What type of clearance is required (medical clearance vs. Pharmacy clearance to hold med vs. Both)? MEDICAL  Are there any medications that need to be held prior to surgery and how long? [place] name and name of physician performing surgery? DR. Carita Pian, DDS AT [facility]  What is your office phone number [phone] PLEASE CONTACT PAM   7.   What is your office fax number [phone]  8.   Anesthesia type (None, local, MAC, general) ? LOCAL   Larina Bras 09/19/2021, 12:57 PM  _________________________________________________________________   (provider comments below)

## 2021-09-24 NOTE — Telephone Encounter (Signed)
   Patient Name: Kevin Collins  DOB: 18-Apr-1930 MRN: [id number]  Primary Cardiologist: Minus Breeding, MD  Chart reviewed as part of pre-operative protocol coverage.   Simple dental extractions are considered low risk procedures per guidelines and generally do not require any specific cardiac clearance. It is also generally accepted that for simple extractions and dental cleanings, there is no need to interrupt blood thinner therapy, however if absolutely needed to hold blood thinner, may hold Eliquis for 1 day prior to extraction.   SBE prophylaxis is not required for the patient from a cardiac standpoint.  I will route this recommendation to the requesting party via Epic fax function and remove from pre-op pool.  Please call with questions.  [place], [place] 09/24/2021, 11:32 AM

## 2021-11-14 ENCOUNTER — Other Ambulatory Visit: Payer: Self-pay

## 2021-11-14 MED ORDER — FUROSEMIDE 20 MG PO TABS: 40.0000 mg | ORAL_TABLET | Freq: Every day | ORAL | 3 refills | Status: DC

## 2021-11-18 NOTE — Progress Notes (Signed)
Cardiology Office Note   Date:  11/20/2021   ID:  Kevin Collins, DOB 1930/03/29, MRN [id number]  PCP:  Kevin Burly, MD  Cardiologist:   Kevin Breeding, MD   Chief Complaint  Patient presents with   Fatigue       History of Present Illness: Kevin Collins is a 87 y.o. male with a history of CAD s/p CABG in 10/1999 (LIMA-LAD, SVG-OM, SVG-diagonal, SVG-PDA/PL), history of bradycardia, atrial fib, HTN, HLD and peripheral neuropathy and EF 40 to 45%.  He has had cardioversion in 09/2019.  However, he went back into atrial fib.   Since I last saw him he has had progressive fatigue.  He has not had any frank syncope or presyncope.  His heart rate is in the 30s.  He has worn a monitor before in 2020.  He has permanent atrial fibrillation with heart rates ranging from 89-83 with an average being 47.  He had over 100 pauses lasting longer than 4.4 seconds.  At the time he manages conservatively but he thinks his resting heart rate is eating slower.  He is not having any chest pressure, neck or arm discomfort.  Despite his age and cardiac issues he gets around well and does work in his workshop.  He is independent.   Past Medical History:  Diagnosis Date   Bradycardia, unspecified    CAD (coronary artery disease)    NATIVE WITHOUT ANGINA   Hereditary and idiopathic neuropathy, unspecified    History of shingles    Hyperlipidemia    Hypertension    ESSENTIAL PRIMARY   Peripheral neuropathy     Past Surgical History:  Procedure Laterality Date   CARDIAC CATHETERIZATION Left 09/1999   NORMAL LEFT MAIN, OCCLUDED LAD, OCCLUDED MID CFX, OCCLUDED PROXIMAL RCA, 60% STENOSIS PROXIMAL DIAG 1, RIGHT TO LEFT COLLATERAL, LEFT TO RIGHT COLLATERAL; LVEF OF 48% DOCUMENTED VIA NUCLEAR STUDY ON 07/12/2009.   CARDIOVERSION N/A 10/05/2019   Procedure: CARDIOVERSION;  Surgeon: Sanda Klein, MD;  Location: [facility];  Service: Cardiovascular;  Laterality: N/A;   CATARACT EXTRACTION     CORONARY  ARTERY BYPASS GRAFT     w/ LIMA TO LAD, SVG TO dx, SVG TO OM, SVG TO AM-PD-PL 10/12/99 [facility]   REMOVAL OF ANKLE PLATE       Current Outpatient Medications  Medication Sig Dispense Refill   apixaban (ELIQUIS) 5 MG TABS tablet TAKE 1 TABLET TWICE A DAY 180 tablet 1   cinacalcet (SENSIPAR) 90 MG tablet Take 1 tablet by mouth daily.     ezetimibe (ZETIA) 10 MG tablet TAKE 1 TABLET DAILY (PLEASE SCHEDULE AN APPOINTMENT FOR FUTURE REFILLS) 60 tablet 5   furosemide (LASIX) 20 MG tablet Take 2 tablets (40 mg total) by mouth daily. 180 tablet 1   gabapentin (NEURONTIN) 800 MG tablet Take 800 mg by mouth 3 (three) times daily.     lisinopril (ZESTRIL) 20 MG tablet TAKE 1 TABLET DAILY (NEED APPOINTMENT FOR REFILLS) 90 tablet 3   Multiple Vitamins-Minerals (PRESERVISION AREDS) CAPS Take 1 capsule by mouth 2 (two) times daily.     Omega-3 Fatty Acids (FISH OIL) 1000 MG CAPS Take 1,000 mg by mouth 3 (three) times daily.     oxyCODONE-acetaminophen (PERCOCET) 7.5-325 MG tablet Take 1 tablet by mouth 3 (three) times daily.     simvastatin (ZOCOR) 80 MG tablet TAKE 1 TABLET DAILY (SCHEDULE AN APPOINTMENT FOR FUTURE REFILLS) 60 tablet 1   spironolactone (ALDACTONE) 25 MG tablet TAKE 1 TABLET  DAILY 90 tablet 3   No current facility-administered medications for this visit.    Allergies:   Patient has no known allergies.    ROS:  Please see the history of present illness.   Otherwise, review of systems are positive for none.   All other systems are reviewed and negative.    PHYSICAL EXAM: VS:  BP 118/62   Pulse (!) 38   Ht 5\' 10"  (1.778 m)   Wt 166 lb (75.3 kg)   BMI 23.82 kg/m  , BMI Body mass index is 23.82 kg/m. GENERAL:  Well appearing NECK:  No jugular venous distention, waveform within normal limits, carotid upstroke brisk and symmetric, no bruits, no thyromegaly LUNGS:  Clear to auscultation bilaterally CHEST:  Unremarkable HEART:  PMI not displaced or sustained,S1 and S2 within  normal limits, no S3, no clicks, no rubs, no murmurs, irregular ABD:  Flat, positive bowel sounds normal in frequency in pitch, no bruits, no rebound, no guarding, no midline pulsatile mass, no hepatomegaly, no splenomegaly EXT:  2 plus pulses throughout, no edema, no cyanosis no clubbing   EKG:  EKG is  ordered today. Atrial fibrillation, rate 38, left axis deviation, poor anterior R wave progression, no acute ST-T wave changes.  Left bundle branch block  Recent Labs: No results found for requested labs within last 8760 hours.    Lipid Panel No results found for: CHOL, TRIG, HDL, CHOLHDL, VLDL, LDLCALC, LDLDIRECT    Wt Readings from Last 3 Encounters:  11/20/21 166 lb (75.3 kg)  11/21/20 177 lb 12.8 oz (80.6 kg)  05/09/20 175 lb (79.4 kg)      Other studies Reviewed: Additional studies/ records that were reviewed today include:  Labs, 2020 event recorder Review of the above records demonstrates: See elsewhere   ASSESSMENT AND PLAN:   Persistent atrial fibrillation:    Mr. [name] has a CHA2DS2 - VASc score of 4 he tolerates anticoagulation.  However, he thinks he is getting more fatigued and his heart rate is lower at baseline than previous.  We talked again about a pacemaker for chronotropic incompetence.  I think he has an indication for pacing although he understands is not a guarantee that he would feel better with pacing.  He would not want standard pacing as he would not want the standard device.  He is planning forms.  However, he would consider a leadless pacemaker and I discussed this with Dr. Quentin Ore today who will see him in consultation.    CAD s/p CABG:    The patient has no new sypmtoms.  No further cardiovascular testing is indicated.  We will continue with aggressive risk reduction and meds as listed.  Hypertension:   The blood pressure is at target.  No change in therapy.   Acute on chronic systolic and diastolic heart failure:    He seems to be euvolemic.   No change in therapy.    AI:  This was mild in 2020 and I will follow this clinically.   Hyperlipidemia:   LDL was 36 with an HDL of 47.  No change in therapy.     Current medicines are reviewed at length with the patient today.  The patient does not have concerns regarding medicines.  The following changes have been made: None  Labs/ tests ordered today include:    None  Orders Placed This Encounter  Procedures   Ambulatory referral to Cardiac Electrophysiology   EKG 12-Lead      Disposition:  FU with me in 6 months.    Signed, Kevin Breeding, MD  11/20/2021 10:44 AM    [facility]

## 2021-11-19 ENCOUNTER — Other Ambulatory Visit: Payer: Self-pay | Admitting: *Deleted

## 2021-11-19 DIAGNOSIS — R001 Bradycardia, unspecified: Secondary | ICD-10-CM | POA: Diagnosis not present

## 2021-11-19 DIAGNOSIS — I5032 Chronic diastolic (congestive) heart failure: Secondary | ICD-10-CM | POA: Diagnosis not present

## 2021-11-19 DIAGNOSIS — I10 Essential (primary) hypertension: Secondary | ICD-10-CM | POA: Diagnosis not present

## 2021-11-19 DIAGNOSIS — I480 Paroxysmal atrial fibrillation: Secondary | ICD-10-CM | POA: Diagnosis not present

## 2021-11-19 DIAGNOSIS — M10262 Drug-induced gout, left knee: Secondary | ICD-10-CM | POA: Diagnosis not present

## 2021-11-19 DIAGNOSIS — N182 Chronic kidney disease, stage 2 (mild): Secondary | ICD-10-CM | POA: Diagnosis not present

## 2021-11-19 MED ORDER — FUROSEMIDE 20 MG PO TABS: 40.0000 mg | ORAL_TABLET | Freq: Every day | ORAL | 1 refills | Status: DC

## 2021-11-20 ENCOUNTER — Other Ambulatory Visit: Payer: Self-pay

## 2021-11-20 ENCOUNTER — Encounter: Payer: Self-pay | Admitting: Cardiology

## 2021-11-20 ENCOUNTER — Ambulatory Visit (INDEPENDENT_AMBULATORY_CARE_PROVIDER_SITE_OTHER): Payer: Medicare Other | Admitting: Cardiology

## 2021-11-20 VITALS — BP 118/62 | HR 38 | Ht 70.0 in | Wt 166.0 lb

## 2021-11-20 DIAGNOSIS — I4819 Other persistent atrial fibrillation: Secondary | ICD-10-CM | POA: Diagnosis not present

## 2021-11-20 DIAGNOSIS — I351 Nonrheumatic aortic (valve) insufficiency: Secondary | ICD-10-CM | POA: Diagnosis not present

## 2021-11-20 DIAGNOSIS — I10 Essential (primary) hypertension: Secondary | ICD-10-CM | POA: Diagnosis not present

## 2021-11-20 DIAGNOSIS — I5042 Chronic combined systolic (congestive) and diastolic (congestive) heart failure: Secondary | ICD-10-CM | POA: Diagnosis not present

## 2021-11-20 DIAGNOSIS — Z951 Presence of aortocoronary bypass graft: Secondary | ICD-10-CM

## 2021-11-20 NOTE — Patient Instructions (Signed)
Medication Instructions:  The current medical regimen is effective;  continue present plan and medications.  *If you need a refill on your cardiac medications before your next appointment, please call your pharmacy*  You have been referred to see Dr Lars Mage to discuss possible pacemaker. You will be contacted to scheduled this appointment.  Follow-Up: At [facility], you and your health needs are our priority.  As part of our continuing mission to provide you with exceptional heart care, we have created designated Provider Care Teams.  These Care Teams include your primary Cardiologist (physician) and Advanced Practice Providers (APPs -  Physician Assistants and Nurse Practitioners) who all work together to provide you with the care you need, when you need it.  We recommend signing up for the patient portal called "MyChart".  Sign up information is provided on this After Visit Summary.  MyChart is used to connect with patients for Virtual Visits (Telemedicine).  Patients are able to view lab/test results, encounter notes, upcoming appointments, etc.  Non-urgent messages can be sent to your provider as well.   To learn more about what you can do with MyChart, go to [email or web address].    Your next appointment:   6 month(s)  The format for your next appointment:   In Person  Provider:   Minus Breeding, MD   Thank you for choosing [facility]!!

## 2021-12-20 ENCOUNTER — Encounter: Payer: Self-pay | Admitting: Cardiology

## 2021-12-20 ENCOUNTER — Other Ambulatory Visit: Payer: Self-pay | Admitting: Cardiology

## 2021-12-20 DIAGNOSIS — I4891 Unspecified atrial fibrillation: Secondary | ICD-10-CM

## 2021-12-20 MED ORDER — APIXABAN 2.5 MG PO TABS: 2.5000 mg | ORAL_TABLET | Freq: Two times a day (BID) | ORAL | 3 refills | Status: DC

## 2021-12-20 NOTE — Telephone Encounter (Signed)
Prescription refill request for Eliquis received. Indication:Afib Last office visit:12/22 Scr:1.7 Age: 88 Weight:75.3 kg  May need to reduce dose.

## 2022-01-09 NOTE — Progress Notes (Signed)
Kevin Collins:    Date:  01/10/2022   ID:  Kevin Collins, DOB 1930/06/17, MRN [id number]  PCP:  Neale Burly, MD  [facility] Cardiologist:  Minus Breeding, MD  [facility] Electrophysiologist:  Vickie Epley, MD   Referring MD: Minus Breeding, MD   Chief Complaint: Permanent atrial fibrillation  History of Present Illness:    Kevin Collins is a 88 y.o. male who presents for an evaluation of permanent atrial fibrillation at the request of Dr. Percival Spanish. Their medical history includes coronary artery disease post CABG in 2000, bradycardia, atrial fibrillation, hypertension, hyperlipidemia and peripheral neuropathy.  His ejection fraction is 40 to 45%.  At the appoint with Dr. Percival Spanish there is mention of his heart rates gradually getting slower.  He does report associated fatigue.  Today he confirms the above.  He tells me that he has noticed a decline in his exercise tolerance with the lower heart rates.  He says his heart rate has gotten as low as the low 30s at home.    Past Medical History:  Diagnosis Date   Bradycardia, unspecified    CAD (coronary artery disease)    NATIVE WITHOUT ANGINA   Hereditary and idiopathic neuropathy, unspecified    History of shingles    Hyperlipidemia    Hypertension    ESSENTIAL PRIMARY   Peripheral neuropathy     Past Surgical History:  Procedure Laterality Date   CARDIAC CATHETERIZATION Left 09/1999   NORMAL LEFT MAIN, OCCLUDED LAD, OCCLUDED MID CFX, OCCLUDED PROXIMAL RCA, 60% STENOSIS PROXIMAL DIAG 1, RIGHT TO LEFT COLLATERAL, LEFT TO RIGHT COLLATERAL; LVEF OF 48% DOCUMENTED VIA NUCLEAR STUDY ON 07/12/2009.   CARDIOVERSION N/A 10/05/2019   Procedure: CARDIOVERSION;  Surgeon: Sanda Klein, MD;  Location: [facility];  Service: Cardiovascular;  Laterality: N/A;   CATARACT EXTRACTION     CORONARY ARTERY BYPASS GRAFT     w/ LIMA TO LAD, SVG TO dx, SVG TO OM, SVG TO AM-PD-PL 10/12/99 [facility]   REMOVAL OF ANKLE  PLATE      Current Medications: Current Meds  Medication Sig   apixaban (ELIQUIS) 2.5 MG TABS tablet Take 1 tablet (2.5 mg total) by mouth 2 (two) times daily.   cinacalcet (SENSIPAR) 90 MG tablet Take 1 tablet by mouth daily.   ezetimibe (ZETIA) 10 MG tablet TAKE 1 TABLET DAILY (PLEASE SCHEDULE AN APPOINTMENT FOR FUTURE REFILLS)   furosemide (LASIX) 20 MG tablet Take 2 tablets (40 mg total) by mouth daily.   gabapentin (NEURONTIN) 800 MG tablet Take 800 mg by mouth 3 (three) times daily.   lisinopril (ZESTRIL) 20 MG tablet TAKE 1 TABLET DAILY (NEED APPOINTMENT FOR REFILLS)   Multiple Vitamins-Minerals (PRESERVISION AREDS) CAPS Take 1 capsule by mouth 2 (two) times daily.   Omega-3 Fatty Acids (FISH OIL) 1000 MG CAPS Take 1,000 mg by mouth 3 (three) times daily.   oxyCODONE-acetaminophen (PERCOCET) 7.5-325 MG tablet Take 1 tablet by mouth 3 (three) times daily.   simvastatin (ZOCOR) 80 MG tablet TAKE 1 TABLET DAILY (SCHEDULE AN APPOINTMENT FOR FUTURE REFILLS)   spironolactone (ALDACTONE) 25 MG tablet TAKE 1 TABLET DAILY     Allergies:   Patient has no known allergies.   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  He is retired Nature conservation officer.     Social Determinants of Health   Financial Resource Strain: Not on file  Food Insecurity: Not on file  Transportation Needs: Not on file  Physical Activity: Not on file  Stress: Not on file  Social Connections: Not on file     Family History: The  patient's family history includes CVA in his father; Other in his mother.  ROS:   Please see the history of present illness.    All other systems reviewed and are negative.  EKGs/Labs/Other Studies Reviewed:    The following studies were reviewed today:   EKG:  The ekg ordered today demonstrates atrial fibrillation with a slow ventricular response of 49 bpm.  Low limb lead voltage.   Recent Labs: No results found for requested labs within last 8760 hours.  Recent Lipid Panel No results found for: CHOL, TRIG, HDL, CHOLHDL, VLDL, LDLCALC, LDLDIRECT  Physical Exam:    VS:  BP 118/60    Pulse (!) 49    Ht 5\' 10"  (1.778 m)    Wt 171 lb 3.2 oz (77.7 kg)    SpO2 99%    BMI 24.56 kg/m     Wt Readings from Last 3 Encounters:  01/10/22 171 lb 3.2 oz (77.7 kg)  11/20/21 166 lb (75.3 kg)  11/21/20 177 lb 12.8 oz (80.6 kg)     GEN:  Well nourished, well developed in no acute distress.  Elderly HEENT: Normal NECK: JVD elevated to the mid neck; No carotid bruits LYMPHATICS: No lymphadenopathy CARDIAC: Irregularly irregular, bradycardic RESPIRATORY:  Clear to auscultation without rales, wheezing or rhonchi  ABDOMEN: Soft, non-tender, non-distended MUSCULOSKELETAL:  No edema; No deformity  SKIN: Warm and dry NEUROLOGIC:  Alert and oriented x 3 PSYCHIATRIC:  Normal affect       ASSESSMENT:    1. Permanent atrial fibrillation ([place])   2. Symptomatic bradycardia    PLAN:    In order of problems listed above:  #Permanent atrial fibrillation #Symptomatic bradycardia I do think there is a relationship between his low heart rates and his early fatigue.  We discussed permanent pacing in detail during today's visit.  Given his history of a reduced ejection fraction I do not think RV pacing is the best strategy (block-HF trial).  He would benefit from a biventricular pacemaker.  We discussed this implant procedure in detail during today's visit including the risks and recovery and he  wishes to proceed. I will update his echocardiogram given prominent JVD on today's exam and fatigue.  Risks, benefits, alternatives to PPM implantation were discussed in detail with the patient today. The patient understands that the risks include but are not limited to bleeding, infection, pneumothorax, perforation, tamponade, vascular damage, renal failure, MI, stroke, death, and lead dislodgement and wishes to proceed.  We will therefore schedule device implantation at the next available time.  He will need to hold his Eliquis for 3 days prior to the implant procedure.  We will likely restart it 5 days after implant.  Total time spent with patient today 45 minutes. This includes reviewing records, evaluating the patient and coordinating  care.  Medication Adjustments/Labs and Tests Ordered: Current medicines are reviewed at length with the patient today.  Concerns regarding medicines are outlined above.  Orders Placed This Encounter  Procedures   CBC w/Diff   Basic Metabolic Panel (BMET)   EKG 12-Lead   ECHOCARDIOGRAM COMPLETE   No orders of the defined types were placed in this encounter.    Signed, Hilton Cork. Quentin Ore, MD, [facility], [facility] 01/10/2022 1:27 PM    [facility]

## 2022-01-09 NOTE — H&P (View-Only) (Signed)
Electrophysiology Office Note:    Date:  01/10/2022   ID:  Kevin Collins, DOB 23-Sep-1930, MRN [id number]  PCP:  Neale Burly, MD  [facility] Cardiologist:  Minus Breeding, MD  [facility] Electrophysiologist:  Vickie Epley, MD   Referring MD: Minus Breeding, MD   Chief Complaint: Permanent atrial fibrillation  History of Present Illness:    Kevin Collins is a 88 y.o. male who presents for an evaluation of permanent atrial fibrillation at the request of Dr. Percival Spanish. Their medical history includes coronary artery disease post CABG in 2000, bradycardia, atrial fibrillation, hypertension, hyperlipidemia and peripheral neuropathy.  His ejection fraction is 40 to 45%.  At the appoint with Dr. Percival Spanish there is mention of his heart rates gradually getting slower.  He does report associated fatigue.  Today he confirms the above.  He tells me that he has noticed a decline in his exercise tolerance with the lower heart rates.  He says his heart rate has gotten as low as the low 30s at home.    Past Medical History:  Diagnosis Date   Bradycardia, unspecified    CAD (coronary artery disease)    NATIVE WITHOUT ANGINA   Hereditary and idiopathic neuropathy, unspecified    History of shingles    Hyperlipidemia    Hypertension    ESSENTIAL PRIMARY   Peripheral neuropathy     Past Surgical History:  Procedure Laterality Date   CARDIAC CATHETERIZATION Left 09/1999   NORMAL LEFT MAIN, OCCLUDED LAD, OCCLUDED MID CFX, OCCLUDED PROXIMAL RCA, 60% STENOSIS PROXIMAL DIAG 1, RIGHT TO LEFT COLLATERAL, LEFT TO RIGHT COLLATERAL; LVEF OF 48% DOCUMENTED VIA NUCLEAR STUDY ON 07/12/2009.   CARDIOVERSION N/A 10/05/2019   Procedure: CARDIOVERSION;  Surgeon: Sanda Klein, MD;  [facility]: [facility];  Service: Cardiovascular;  Laterality: N/A;   CATARACT EXTRACTION     CORONARY ARTERY BYPASS GRAFT     w/ LIMA TO LAD, SVG TO dx, SVG TO OM, SVG TO AM-PD-PL 10/12/99 [facility]   REMOVAL OF ANKLE  PLATE      Current Medications: Current Meds  Medication Sig   apixaban (ELIQUIS) 2.5 MG TABS tablet Take 1 tablet (2.5 mg total) by mouth 2 (two) times daily.   cinacalcet (SENSIPAR) 90 MG tablet Take 1 tablet by mouth daily.   ezetimibe (ZETIA) 10 MG tablet TAKE 1 TABLET DAILY (PLEASE SCHEDULE AN APPOINTMENT FOR FUTURE REFILLS)   furosemide (LASIX) 20 MG tablet Take 2 tablets (40 mg total) by mouth daily.   gabapentin (NEURONTIN) 800 MG tablet Take 800 mg by mouth 3 (three) times daily.   lisinopril (ZESTRIL) 20 MG tablet TAKE 1 TABLET DAILY (NEED APPOINTMENT FOR REFILLS)   Multiple Vitamins-Minerals (PRESERVISION AREDS) CAPS Take 1 capsule by mouth 2 (two) times daily.   Omega-3 Fatty Acids (FISH OIL) 1000 MG CAPS Take 1,000 mg by mouth 3 (three) times daily.   oxyCODONE-acetaminophen (PERCOCET) 7.5-325 MG tablet Take 1 tablet by mouth 3 (three) times daily.   simvastatin (ZOCOR) 80 MG tablet TAKE 1 TABLET DAILY (SCHEDULE AN APPOINTMENT FOR FUTURE REFILLS)   spironolactone (ALDACTONE) 25 MG tablet TAKE 1 TABLET DAILY     Allergies:   Patient has no known allergies.   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  He is retired Nature conservation officer.     Social Determinants of Health   Financial Resource Strain: Not on file  Food Insecurity: Not on file  Transportation Needs: Not on file  Physical Activity: Not on file  Stress: Not on file  Social Connections: Not on file     Family History: The  patient's family history includes CVA in his father; Other in his mother.  ROS:   Please see the history of present illness.    All other systems reviewed and are negative.  EKGs/Labs/Other Studies Reviewed:    The following studies were reviewed today:   EKG:  The ekg ordered today demonstrates atrial fibrillation with a slow ventricular response of 49 bpm.  Low limb lead voltage.   Recent Labs: No results found for requested labs within last 8760 hours.  Recent Lipid Panel No results found for: CHOL, TRIG, HDL, CHOLHDL, VLDL, LDLCALC, LDLDIRECT  Physical Exam:    VS:  BP 118/60    Pulse (!) 49    Ht 5\' 10"  (1.778 m)    Wt 171 lb 3.2 oz (77.7 kg)    SpO2 99%    BMI 24.56 kg/m     Wt Readings from Last 3 Encounters:  01/10/22 171 lb 3.2 oz (77.7 kg)  11/20/21 166 lb (75.3 kg)  11/21/20 177 lb 12.8 oz (80.6 kg)     GEN:  Well nourished, well developed in no acute distress.  Elderly HEENT: Normal NECK: JVD elevated to the mid neck; No carotid bruits LYMPHATICS: No lymphadenopathy CARDIAC: Irregularly irregular, bradycardic RESPIRATORY:  Clear to auscultation without rales, wheezing or rhonchi  ABDOMEN: Soft, non-tender, non-distended MUSCULOSKELETAL:  No edema; No deformity  SKIN: Warm and dry NEUROLOGIC:  Alert and oriented x 3 PSYCHIATRIC:  Normal affect       ASSESSMENT:    1. Permanent atrial fibrillation ([place])   2. Symptomatic bradycardia    PLAN:    In order of problems listed above:  #Permanent atrial fibrillation #Symptomatic bradycardia I do think there is a relationship between his low heart rates and his early fatigue.  We discussed permanent pacing in detail during today's visit.  Given his history of a reduced ejection fraction I do not think RV pacing is the best strategy (block-HF trial).  He would benefit from a biventricular pacemaker.  We discussed this implant procedure in detail during today's visit including the risks and recovery and he  wishes to proceed. I will update his echocardiogram given prominent JVD on today's exam and fatigue.  Risks, benefits, alternatives to PPM implantation were discussed in detail with the patient today. The patient understands that the risks include but are not limited to bleeding, infection, pneumothorax, perforation, tamponade, vascular damage, renal failure, MI, stroke, death, and lead dislodgement and wishes to proceed.  We will therefore schedule device implantation at the next available time.  He will need to hold his Eliquis for 3 days prior to the implant procedure.  We will likely restart it 5 days after implant.  Total time spent with patient today 45 minutes. This includes reviewing records, evaluating the patient and coordinating  care.  Medication Adjustments/Labs and Tests Ordered: Current medicines are reviewed at length with the patient today.  Concerns regarding medicines are outlined above.  Orders Placed This Encounter  Procedures   CBC w/Diff   Basic Metabolic Panel (BMET)   EKG 12-Lead   ECHOCARDIOGRAM COMPLETE   No orders of the defined types were placed in this encounter.    Signed, Hilton Cork. Quentin Ore, MD, [facility], [facility] 01/10/2022 1:27 PM    Elect[facility]

## 2022-01-10 ENCOUNTER — Encounter: Payer: Self-pay | Admitting: Cardiology

## 2022-01-10 ENCOUNTER — Encounter: Payer: Self-pay | Admitting: *Deleted

## 2022-01-10 ENCOUNTER — Ambulatory Visit (INDEPENDENT_AMBULATORY_CARE_PROVIDER_SITE_OTHER): Payer: Medicare PPO | Admitting: Cardiology

## 2022-01-10 ENCOUNTER — Other Ambulatory Visit: Payer: Self-pay

## 2022-01-10 VITALS — BP 118/60 | HR 49 | Ht 70.0 in | Wt 171.2 lb

## 2022-01-10 DIAGNOSIS — I4821 Permanent atrial fibrillation: Secondary | ICD-10-CM

## 2022-01-10 DIAGNOSIS — R001 Bradycardia, unspecified: Secondary | ICD-10-CM | POA: Diagnosis not present

## 2022-01-10 NOTE — Patient Instructions (Addendum)
Medication Instructions:  Your physician recommends that you continue on your current medications as directed. Please refer to the Current Medication list given to you today. *If you need a refill on your cardiac medications before your next appointment, please call your pharmacy*  Lab Work: CBC, BMP  If you have labs (blood work) drawn today and your tests are completely normal, you will receive your results only by: [place] (if you have MyChart) OR A paper copy in the mail If you have any lab test that is abnormal or we need to change your treatment, we will call you to review the results.  Testing/Procedures: Your physician has requested that you have an echocardiogram. Echocardiography is a painless test that uses sound waves to create images of your heart. It provides your doctor with information about the size and shape of your heart and how well your hearts chambers and valves are working. This procedure takes approximately one hour. There are no restrictions for this procedure.   Follow-Up: At [facility], you and your health needs are our priority.  As part of our continuing mission to provide you with exceptional heart care, we have created designated Provider Care Teams.  These Care Teams include your primary Cardiologist (physician) and Advanced Practice Providers (APPs -  Physician Assistants and Nurse Practitioners) who all work together to provide you with the care you need, when you need it.  Your physician wants you to follow-up in: see instruction letter.   We recommend signing up for the patient portal called "MyChart".  Sign up information is provided on this After Visit Summary.  MyChart is used to connect with patients for Virtual Visits (Telemedicine).  Patients are able to view lab/test results, encounter notes, upcoming appointments, etc.  Non-urgent messages can be sent to your provider as well.   To learn more about what you can do with MyChart, go to  [email or web address].    Any Other Special Instructions Will Be Listed Below (If Applicable).  Pacemaker Implantation, Adult Pacemaker implantation is a procedure to place a pacemaker inside the chest. A pacemaker is a small computer that sends electrical signals to the heart and helps the heart beat normally. A pacemaker also stores information about heart rhythms. You may need pacemaker implantation if you have: A slow heartbeat (bradycardia). Loss of consciousness that happens repeatedly (syncope) or repeated episodes of dizziness or light-headedness because of an irregular heart rate. Shortness of breath (dyspnea) due to heart problems. The pacemaker usually attaches to your heart through a wire called a lead. One or two leads may be needed. There are different types of pacemakers: Transvenous pacemaker. This type is placed under the skin or muscle of your upper chest area. The lead goes through a vein in the chest area to reach the inside of the heart. Epicardial pacemaker. This type is placed under the skin or muscle of your chest or abdomen. The lead goes through your chest to the outside of the heart. Tell a health care provider about: Any allergies you have. All medicines you are taking, including vitamins, herbs, eye drops, creams, and over-the-counter medicines. Any problems you or family members have had with anesthetic medicines. Any blood or bone disorders you have. Any surgeries you have had. Any medical conditions you have. Whether you are pregnant or may be pregnant. What are the risks? Generally, this is a safe procedure. However, problems may occur, including: Infection. Bleeding. Failure of the pacemaker or the lead. Collapse of a lung or  bleeding into a lung. Blood clot inside a blood vessel with a lead. Damage to the heart. Infection inside the heart (endocarditis). Allergic reactions to medicines. What happens before the procedure? Staying hydrated Follow  instructions from your health care provider about hydration, which may include: Up to 2 hours before the procedure - you may continue to drink clear liquids, such as water, clear fruit juice, black coffee, and plain tea.  Eating and drinking restrictions Follow instructions from your health care provider about eating and drinking, which may include: 8 hours before the procedure - stop eating heavy meals or foods, such as meat, fried foods, or fatty foods. 6 hours before the procedure - stop eating light meals or foods, such as toast or cereal. 6 hours before the procedure - stop drinking milk or drinks that contain milk. 2 hours before the procedure - stop drinking clear liquids. Medicines Ask your health care provider about: Changing or stopping your regular medicines. This is especially important if you are taking diabetes medicines or blood thinners. Taking medicines such as aspirin and ibuprofen. These medicines can thin your blood. Do not take these medicines unless your health care provider tells you to take them. Taking over-the-counter medicines, vitamins, herbs, and supplements. Tests You may have: A heart evaluation. This may include: An electrocardiogram (ECG). This involves placing patches on your skin to check your heart rhythm. A chest X-ray. An echocardiogram. This is a test that uses sound waves (ultrasound) to produce an image of the heart. A cardiac rhythm monitor. This is used to record your heart rhythm and any events for a longer period of time. Blood tests. Genetic testing. General instructions Do not use any products that contain nicotine or tobacco for at least 4 weeks before the procedure. These products include cigarettes, e-cigarettes, and chewing tobacco. If you need help quitting, ask your health care provider. Ask your health care provider: How your surgery site will be marked. What steps will be taken to help prevent infection. These steps may  include: Removing hair at the surgery site. Washing skin with a germ-killing soap. Receiving antibiotic medicine. Plan to have someone take you home from the hospital or clinic. If you will be going home right after the procedure, plan to have someone with you for 24 hours. What happens during the procedure? An IV will be inserted into one of your veins. You will be given one or more of the following: A medicine to help you relax (sedative). A medicine to numb the area (local anesthetic). A medicine to make you fall asleep (general anesthetic). The next steps vary depending on the type of pacemaker you will be getting. If you are getting a transvenous pacemaker: An incision will be made in your upper chest. A pocket will be made for the pacemaker. It may be placed under the skin or between layers of muscle. The lead will be inserted into a blood vessel that goes to the heart. While X-rays are taken by an imaging machine (fluoroscopy), the lead will be advanced through the vein to the inside of your heart. The other end of the lead will be tunneled under the skin and attached to the pacemaker. If you are getting an epicardial pacemaker: An incision will be made near your ribs or breastbone (sternum) for the lead. The lead will be attached to the outside of your heart. Another incision will be made in your chest or upper abdomen to create a pocket for the pacemaker. The free end  of the lead will be tunneled under the skin and attached to the pacemaker. The transvenous or epicardial pacemaker will be tested. Imaging studies may be done to check the lead position. The incisions will be closed with stitches (sutures), adhesive strips, or skin glue. Bandages (dressings) will be placed over the incisions. The procedure may vary among health care providers and hospitals. What happens after the procedure? Your blood pressure, heart rate, breathing rate, and blood oxygen level will be monitored  until you leave the hospital or clinic. You may be given antibiotics. You will be given pain medicine. An ECG and chest X-rays will be done. You may need to wear a continuous type of ECG (Holter monitor) to check your heart rhythm. Your health care provider will program the pacemaker. If you were given a sedative during the procedure, it can affect you for several hours. Do not drive or operate machinery until your health care provider says that it is safe. You will be given a pacemaker identification card. This card lists the implant date, device model, and manufacturer of your pacemaker. Summary A pacemaker is a small computer that sends electrical signals to the heart and helps the heart beat normally. There are different types of pacemakers. A pacemaker may be placed under the skin or muscle of your chest or abdomen. Follow instructions from your health care provider about eating and drinking and about taking medicines before the procedure. This information is not intended to replace advice given to you by your health care provider. Make sure you discuss any questions you have with your health care provider. Document Revised: 08/13/2021 Document Reviewed: 11/02/2019 Elsevier Patient Education  2022 [employer].

## 2022-01-11 LAB — CBC WITH DIFFERENTIAL/PLATELET
Basophils Absolute: 0 10*3/uL (ref 0.0–0.2)
Basos: 0 %
EOS (ABSOLUTE): 0.2 10*3/uL (ref 0.0–0.4)
Eos: 3 %
Hematocrit: 33 % — ABNORMAL LOW (ref 37.5–51.0)
Hemoglobin: 11.2 g/dL — ABNORMAL LOW (ref 13.0–17.7)
Immature Grans (Abs): 0.1 10*3/uL (ref 0.0–0.1)
Immature Granulocytes: 1 %
Lymphocytes Absolute: 1.7 10*3/uL (ref 0.7–3.1)
Lymphs: 22 %
MCH: 31.3 pg (ref 26.6–33.0)
MCHC: 33.9 g/dL (ref 31.5–35.7)
MCV: 92 fL (ref 79–97)
Monocytes Absolute: 0.6 10*3/uL (ref 0.1–0.9)
Monocytes: 7 %
Neutrophils Absolute: 5.1 10*3/uL (ref 1.4–7.0)
Neutrophils: 67 %
Platelets: 149 10*3/uL — ABNORMAL LOW (ref 150–450)
RBC: 3.58 x10E6/uL — ABNORMAL LOW (ref 4.14–5.80)
RDW: 11.8 % (ref 11.6–15.4)
WBC: 7.6 10*3/uL (ref 3.4–10.8)

## 2022-01-11 LAB — BASIC METABOLIC PANEL
BUN/Creatinine Ratio: 16 (ref 10–24)
BUN: 31 mg/dL — ABNORMAL HIGH (ref 8–27)
CO2: 23 mmol/L (ref 20–29)
Calcium: 8.1 mg/dL — ABNORMAL LOW (ref 8.6–10.2)
Chloride: 101 mmol/L (ref 96–106)
Creatinine, Ser: 1.89 mg/dL — ABNORMAL HIGH (ref 0.76–1.27)
Glucose: 89 mg/dL (ref 70–99)
Potassium: 4.9 mmol/L (ref 3.5–5.2)
Sodium: 139 mmol/L (ref 134–144)
eGFR: 34 mL/min/{1.73_m2} — ABNORMAL LOW (ref >59)

## 2022-01-12 ENCOUNTER — Other Ambulatory Visit: Payer: Self-pay | Admitting: Cardiology

## 2022-01-14 ENCOUNTER — Other Ambulatory Visit: Payer: Self-pay

## 2022-01-14 ENCOUNTER — Ambulatory Visit (HOSPITAL_COMMUNITY): Payer: Medicare PPO | Attending: Cardiology

## 2022-01-14 DIAGNOSIS — R001 Bradycardia, unspecified: Secondary | ICD-10-CM | POA: Insufficient documentation

## 2022-01-14 DIAGNOSIS — I4821 Permanent atrial fibrillation: Secondary | ICD-10-CM | POA: Insufficient documentation

## 2022-01-14 LAB — ECHOCARDIOGRAM COMPLETE
Area-P 1/2: 3.63 cm2
P 1/2 time: 481 msec
S' Lateral: 4.3 cm

## 2022-01-14 MED ORDER — PERFLUTREN LIPID MICROSPHERE
3.0000 mL | INTRAVENOUS | Status: AC | PRN
  Administered 2022-01-14: 3 mL via INTRAVENOUS

## 2022-01-15 ENCOUNTER — Encounter: Payer: Self-pay | Admitting: Cardiology

## 2022-01-15 ENCOUNTER — Telehealth: Payer: Self-pay | Admitting: Cardiology

## 2022-01-15 ENCOUNTER — Other Ambulatory Visit: Payer: Self-pay

## 2022-01-15 DIAGNOSIS — H353132 Nonexudative age-related macular degeneration, bilateral, intermediate dry stage: Secondary | ICD-10-CM | POA: Diagnosis not present

## 2022-01-15 MED ORDER — SIMVASTATIN 80 MG PO TABS: ORAL_TABLET | ORAL | 1 refills | Status: DC

## 2022-01-15 MED ORDER — EZETIMIBE 10 MG PO TABS: ORAL_TABLET | ORAL | 1 refills | Status: DC

## 2022-01-15 NOTE — Telephone Encounter (Signed)
Patient called wanting to know what only a 60 day supply was written for his medication.  I advised him it was because he needs to schedule an appt.  When I told him, he got upset and said "I'm not going through this, [facility] system is a big joke, I'm going to find me another heart doctor, good-bye" and hung up.

## 2022-01-15 NOTE — Progress Notes (Signed)
I contacted patient back, due to his mychart message.  He states he received a 60 day supply for his medications, and that he needed an appointment. After review patient was just seen in 11/2021, and needed 6 month follow up. Patient is okay to have refill sent. Updated RX.  Patient verbalized understanding.

## 2022-01-24 ENCOUNTER — Ambulatory Visit (HOSPITAL_COMMUNITY)
Admission: RE | Admit: 2022-01-24 | Discharge: 2022-01-25 | Disposition: A | Discharge status: Home / Self Care | Payer: Medicare PPO | Attending: Cardiology | Admitting: Cardiology

## 2022-01-24 ENCOUNTER — Other Ambulatory Visit: Payer: Self-pay

## 2022-01-24 ENCOUNTER — Encounter (HOSPITAL_COMMUNITY)
Admission: RE | Disposition: A | Discharge status: Home / Self Care | Payer: TRICARE For Life (TFL) | Source: Home / Self Care | Attending: Cardiology

## 2022-01-24 DIAGNOSIS — I4821 Permanent atrial fibrillation: Secondary | ICD-10-CM | POA: Diagnosis not present

## 2022-01-24 DIAGNOSIS — I5022 Chronic systolic (congestive) heart failure: Secondary | ICD-10-CM | POA: Diagnosis not present

## 2022-01-24 DIAGNOSIS — E785 Hyperlipidemia, unspecified: Secondary | ICD-10-CM | POA: Insufficient documentation

## 2022-01-24 DIAGNOSIS — Z7901 Long term (current) use of anticoagulants: Secondary | ICD-10-CM | POA: Insufficient documentation

## 2022-01-24 DIAGNOSIS — R001 Bradycardia, unspecified: Secondary | ICD-10-CM | POA: Diagnosis not present

## 2022-01-24 DIAGNOSIS — Z950 Presence of cardiac pacemaker: Secondary | ICD-10-CM

## 2022-01-24 DIAGNOSIS — G629 Polyneuropathy, unspecified: Secondary | ICD-10-CM | POA: Diagnosis not present

## 2022-01-24 DIAGNOSIS — I4891 Unspecified atrial fibrillation: Secondary | ICD-10-CM | POA: Diagnosis present

## 2022-01-24 DIAGNOSIS — I110 Hypertensive heart disease with heart failure: Secondary | ICD-10-CM | POA: Insufficient documentation

## 2022-01-24 DIAGNOSIS — Z951 Presence of aortocoronary bypass graft: Secondary | ICD-10-CM | POA: Diagnosis not present

## 2022-01-24 DIAGNOSIS — Z20822 Contact with and (suspected) exposure to covid-19: Secondary | ICD-10-CM | POA: Insufficient documentation

## 2022-01-24 DIAGNOSIS — I255 Ischemic cardiomyopathy: Secondary | ICD-10-CM | POA: Diagnosis not present

## 2022-01-24 DIAGNOSIS — I2510 Atherosclerotic heart disease of native coronary artery without angina pectoris: Secondary | ICD-10-CM | POA: Insufficient documentation

## 2022-01-24 DIAGNOSIS — Z87891 Personal history of nicotine dependence: Secondary | ICD-10-CM | POA: Insufficient documentation

## 2022-01-24 HISTORY — PX: BIV PACEMAKER INSERTION CRT-P: EP1199

## 2022-01-24 LAB — RESP PANEL BY RT-PCR (FLU A&B, COVID) ARPGX2
Influenza A by PCR: NEGATIVE
Influenza B by PCR: NEGATIVE
SARS Coronavirus 2 by RT PCR: NEGATIVE

## 2022-01-24 SURGERY — BIV PACEMAKER INSERTION CRT-P
Anesthesia: LOCAL

## 2022-01-24 MED ORDER — OXYCODONE-ACETAMINOPHEN 7.5-325 MG PO TABS: 1.0000 | ORAL_TABLET | Freq: Three times a day (TID) | ORAL | Status: DC

## 2022-01-24 MED ORDER — CHLORHEXIDINE GLUCONATE 4 % EX LIQD: 4.0000 "application " | Freq: Once | CUTANEOUS | Status: DC

## 2022-01-24 MED ORDER — IOHEXOL 350 MG/ML SOLN
INTRAVENOUS | Status: DC | PRN
  Administered 2022-01-24: 3 mL

## 2022-01-24 MED ORDER — CEFAZOLIN SODIUM-DEXTROSE 2-4 GM/100ML-% IV SOLN
INTRAVENOUS | Status: AC
  Filled 2022-01-24: qty 100

## 2022-01-24 MED ORDER — ACETAMINOPHEN 325 MG PO TABS: 325.0000 mg | ORAL_TABLET | ORAL | Status: DC | PRN

## 2022-01-24 MED ORDER — FUROSEMIDE 20 MG PO TABS
20.0000 mg | ORAL_TABLET | Freq: Every day | ORAL | Status: DC
  Filled 2022-01-24: qty 1

## 2022-01-24 MED ORDER — SODIUM CHLORIDE 0.9 % IV SOLN
INTRAVENOUS | Status: AC
  Filled 2022-01-24: qty 2

## 2022-01-24 MED ORDER — LIDOCAINE HCL 1 % IJ SOLN
INTRAMUSCULAR | Status: AC
  Filled 2022-01-24: qty 60

## 2022-01-24 MED ORDER — GABAPENTIN 800 MG PO TABS: 800.0000 mg | ORAL_TABLET | Freq: Three times a day (TID) | ORAL | Status: DC

## 2022-01-24 MED ORDER — MIDAZOLAM HCL 5 MG/5ML IJ SOLN
INTRAMUSCULAR | Status: AC
  Filled 2022-01-24: qty 5

## 2022-01-24 MED ORDER — LIDOCAINE HCL (PF) 1 % IJ SOLN
INTRAMUSCULAR | Status: DC | PRN
  Administered 2022-01-24: 50 mL

## 2022-01-24 MED ORDER — MIDAZOLAM HCL 5 MG/5ML IJ SOLN
INTRAMUSCULAR | Status: DC | PRN
  Administered 2022-01-24: .5 mg via INTRAVENOUS

## 2022-01-24 MED ORDER — OXYCODONE-ACETAMINOPHEN 5-325 MG PO TABS
1.0000 | ORAL_TABLET | Freq: Once | ORAL | Status: AC
  Administered 2022-01-24: 1 via ORAL
  Filled 2022-01-24: qty 1

## 2022-01-24 MED ORDER — HEPARIN (PORCINE) IN NACL 1000-0.9 UT/500ML-% IV SOLN
INTRAVENOUS | Status: DC | PRN
  Administered 2022-01-24: 500 mL

## 2022-01-24 MED ORDER — SODIUM CHLORIDE 0.9 % IV SOLN: INTRAVENOUS | Status: DC

## 2022-01-24 MED ORDER — OXYCODONE-ACETAMINOPHEN 7.5-325 MG PO TABS
1.0000 | ORAL_TABLET | Freq: Three times a day (TID) | ORAL | Status: DC
  Administered 2022-01-25: 1 via ORAL
  Filled 2022-01-24 (×2): qty 1

## 2022-01-24 MED ORDER — SPIRONOLACTONE 25 MG PO TABS
25.0000 mg | ORAL_TABLET | Freq: Every day | ORAL | Status: DC
  Filled 2022-01-24: qty 1

## 2022-01-24 MED ORDER — HEPARIN (PORCINE) IN NACL 1000-0.9 UT/500ML-% IV SOLN
INTRAVENOUS | Status: AC
  Filled 2022-01-24: qty 500

## 2022-01-24 MED ORDER — CEFAZOLIN SODIUM-DEXTROSE 2-4 GM/100ML-% IV SOLN
2.0000 g | INTRAVENOUS | Status: AC
  Administered 2022-01-24: 2 g via INTRAVENOUS

## 2022-01-24 MED ORDER — FENTANYL CITRATE (PF) 100 MCG/2ML IJ SOLN
INTRAMUSCULAR | Status: AC
  Filled 2022-01-24: qty 2

## 2022-01-24 MED ORDER — ONDANSETRON HCL 4 MG/2ML IJ SOLN: 4.0000 mg | Freq: Four times a day (QID) | INTRAMUSCULAR | Status: DC | PRN

## 2022-01-24 MED ORDER — POVIDONE-IODINE 10 % EX SWAB: 2.0000 "application " | Freq: Once | CUTANEOUS | Status: DC

## 2022-01-24 MED ORDER — GABAPENTIN 400 MG PO CAPS
800.0000 mg | ORAL_CAPSULE | Freq: Three times a day (TID) | ORAL | Status: DC
  Administered 2022-01-24 – 2022-01-25 (×2): 800 mg via ORAL
  Filled 2022-01-24 (×2): qty 2

## 2022-01-24 MED ORDER — OXYCODONE HCL 5 MG PO TABS
2.5000 mg | ORAL_TABLET | Freq: Once | ORAL | Status: AC
  Administered 2022-01-24: 2.5 mg via ORAL
  Filled 2022-01-24: qty 1

## 2022-01-24 MED ORDER — SODIUM CHLORIDE 0.9 % IV SOLN
80.0000 mg | INTRAVENOUS | Status: AC
  Administered 2022-01-24: 80 mg

## 2022-01-24 MED ORDER — FENTANYL CITRATE (PF) 100 MCG/2ML IJ SOLN
INTRAMUSCULAR | Status: DC | PRN
  Administered 2022-01-24: 12.5 ug via INTRAVENOUS

## 2022-01-24 SURGICAL SUPPLY — 14 items
CABLE SURGICAL S-101-97-12 (CABLE) ×2 IMPLANT
CATH ATTAIN COM SURV 6250V-EH (CATHETERS) ×1 IMPLANT
LEAD QUARTET 1458Q-86CM (Lead) ×1 IMPLANT
LEAD TENDRIL MRI 58CM LPA1200M (Lead) ×1 IMPLANT
PACEMAKER QUDR ALLR CRT PM3562 (Pacemaker) IMPLANT
PAD DEFIB RADIO PHYSIO CONN (PAD) ×2 IMPLANT
PMKR QUADRA ALLURE CRT PM3562 (Pacemaker) ×2 IMPLANT
SHEATH 8FR PRELUDE SNAP 13 (SHEATH) ×1 IMPLANT
SHEATH 9.5FR PRELUDE SNAP 13 (SHEATH) ×1 IMPLANT
SHEATH PROBE COVER 6X72 (BAG) ×1 IMPLANT
SLITTER 6232ADJ (MISCELLANEOUS) ×1 IMPLANT
TRAY PACEMAKER INSERTION (PACKS) ×2 IMPLANT
WIRE ACUITY WHISPER EDS 4648 (WIRE) ×1 IMPLANT
WIRE HI TORQ VERSACORE-J 145CM (WIRE) ×1 IMPLANT

## 2022-01-24 NOTE — Progress Notes (Signed)
Patient arrived onto the unit from cath lab- post pacemaker implantation. Patient stated he has no pain and site has old drainage (marked), dry and intact. Tele monitor applied and CCMD notified. VS protocol post PPI implemented. Dinner tray ordered.

## 2022-01-24 NOTE — Discharge Instructions (Addendum)
° ° °  Supplemental Discharge Instructions for  Pacemaker/Defibrillator Patients    Activity No heavy lifting or vigorous activity with your left/right arm for 6 to 8 weeks.  Do not raise your left/right arm above your head for one week.  Gradually raise your affected arm as drawn below.             01/29/22                     01/30/22                   01/31/22                   02/01/22 __  NO DRIVING for   1 week  ; you may begin driving on   6/38/75  .  WOUND CARE Keep the wound area clean and dry.  Do not get this area wet , no showers for one week; you may shower on  02/01/22   . The tape/steri-strips on your wound will fall off; do not pull them off.  No bandage is needed on the site.  DO  NOT apply any creams, oils, or ointments to the wound area. If you notice any drainage or discharge from the wound, any swelling or bruising at the site, or you develop a fever > 101? F after you are discharged home, call the office at once.  Special Instructions You are still able to use cellular telephones; use the ear opposite the side where you have your pacemaker/defibrillator.  Avoid carrying your cellular phone near your device. When traveling through airports, show security personnel your identification card to avoid being screened in the metal detectors.  Ask the security personnel to use the hand wand. Avoid arc welding equipment, MRI testing (magnetic resonance imaging), TENS units (transcutaneous nerve stimulators).  Call the office for questions about other devices. Avoid electrical appliances that are in poor condition or are not properly grounded. Microwave ovens are safe to be near or to operate.

## 2022-01-24 NOTE — Progress Notes (Signed)
Nasal swab for COVID obtained and sent to lab 

## 2022-01-24 NOTE — Plan of Care (Signed)
  Problem: Activity: Goal: Risk for activity intolerance will decrease Outcome: Progressing   Problem: Pain Managment: Goal: General experience of comfort will improve Outcome: Progressing   Problem: Safety: Goal: Ability to remain free from injury will improve Outcome: Progressing   

## 2022-01-24 NOTE — Interval H&P Note (Signed)
History and Physical Interval Note:  01/24/2022 12:25 PM  Kevin Collins  has presented today for surgery, with the diagnosis of bradycardia.  The various methods of treatment have been discussed with the patient and family. After consideration of risks, benefits and other options for treatment, the patient has consented to  Procedure(s): BIV PACEMAKER INSERTION CRT-P (N/A) as a surgical intervention.  The patient's history has been reviewed, patient examined, no change in status, stable for surgery.  I have reviewed the patient's chart and labs.  Questions were answered to the patient's satisfaction.     [name] T Marcile Fuquay

## 2022-01-25 ENCOUNTER — Ambulatory Visit (HOSPITAL_COMMUNITY): Payer: Medicare PPO

## 2022-01-25 DIAGNOSIS — Z951 Presence of aortocoronary bypass graft: Secondary | ICD-10-CM | POA: Diagnosis not present

## 2022-01-25 DIAGNOSIS — G629 Polyneuropathy, unspecified: Secondary | ICD-10-CM | POA: Diagnosis not present

## 2022-01-25 DIAGNOSIS — Z20822 Contact with and (suspected) exposure to covid-19: Secondary | ICD-10-CM | POA: Diagnosis not present

## 2022-01-25 DIAGNOSIS — R001 Bradycardia, unspecified: Secondary | ICD-10-CM | POA: Diagnosis not present

## 2022-01-25 DIAGNOSIS — I2510 Atherosclerotic heart disease of native coronary artery without angina pectoris: Secondary | ICD-10-CM | POA: Diagnosis not present

## 2022-01-25 DIAGNOSIS — I255 Ischemic cardiomyopathy: Secondary | ICD-10-CM | POA: Diagnosis not present

## 2022-01-25 DIAGNOSIS — E785 Hyperlipidemia, unspecified: Secondary | ICD-10-CM | POA: Diagnosis not present

## 2022-01-25 DIAGNOSIS — Z87891 Personal history of nicotine dependence: Secondary | ICD-10-CM | POA: Diagnosis not present

## 2022-01-25 DIAGNOSIS — I4821 Permanent atrial fibrillation: Secondary | ICD-10-CM | POA: Diagnosis not present

## 2022-01-25 MED ORDER — ACETAMINOPHEN 325 MG PO TABS: 325.0000 mg | ORAL_TABLET | ORAL | Status: DC | PRN

## 2022-01-25 NOTE — Progress Notes (Addendum)
° °  EP Progress Note  Patient Name: Kevin Collins Date of Encounter: 01/25/2022  [facility] Cardiologist: Minus Breeding, MD   Subjective   NAEO. Doing OK this morning.   Inpatient Medications    Scheduled Meds:  furosemide  20 mg Oral Daily   gabapentin  800 mg Oral TID   oxyCODONE-acetaminophen  1 tablet Oral TID   spironolactone  25 mg Oral Daily   Continuous Infusions:  PRN Meds: acetaminophen, ondansetron (ZOFRAN) IV   Vital Signs    Vitals:   01/24/22 1700 01/24/22 1921 01/25/22 0006 01/25/22 0453  BP: (!) 143/98 120/61 106/71 113/72  Pulse: 60 (!) 59 (!) 59 60  Resp: 19   17  Temp:  98.1 F (36.7 C) 97.9 F (36.6 C) 98.8 F (37.1 C)  TempSrc:  Oral Oral Oral  SpO2: 99%  97% 97%  Weight:    76.8 kg  Height:        Intake/Output Summary (Last 24 hours) at 01/25/2022 0727 Last data filed at 01/25/2022 0456 Gross per 24 hour  Intake 897.5 ml  Output 550 ml  Net 347.5 ml   Last 3 Weights 01/25/2022 01/24/2022 01/24/2022  Weight (lbs) 169 lb 5 oz 170 lb 10.2 oz 162 lb  Weight (kg) 76.8 kg 77.4 kg 73.483 kg      Telemetry    BiV paced - Personally Reviewed  ECG    BiV paced. - Personally Reviewed  Physical Exam   GEN: No acute distress.   Neck: No JVD Cardiac: RRR, no murmurs, rubs, or gallops. PPM pocket healing well without hematoma. Respiratory: Clear to auscultation bilaterally. GI: Soft, nontender, non-distended  MS: No edema; No deformity. Neuro:  Nonfocal  Psych: Normal affect   Labs    High Sensitivity Troponin:  No results for input(s): TROPONINIHS in the last 720 hours.   ChemistryNo results for input(s): NA, K, CL, CO2, GLUCOSE, BUN, CREATININE, CALCIUM, MG, PROT, ALBUMIN, AST, ALT, ALKPHOS, BILITOT, GFRNONAA, GFRAA, ANIONGAP in the last 168 hours.  Lipids No results for input(s): CHOL, TRIG, HDL, LABVLDL, LDLCALC, CHOLHDL in the last 168 hours.  HematologyNo results for input(s): WBC, RBC, HGB, HCT, MCV, MCH, MCHC, RDW, PLT in the  last 168 hours. Thyroid No results for input(s): TSH, FREET4 in the last 168 hours.  BNPNo results for input(s): BNP, PROBNP in the last 168 hours.  DDimer No results for input(s): DDIMER in the last 168 hours.   Radiology    EP PPM/ICD IMPLANT  Result Date: 01/24/2022 CONCLUSIONS: 1. Successful implantation of a CRT-P BiV for symptomatic bradycardia and ischemic cardiomyopathy 2. No early apparent complications. 3. Hold apixaban for 5 days (resume 01/30/2022)    Cardiac Studies      Assessment & Plan    #Symptomatic bradycardia #Permanent atrial fibrillation Now s/p PPM implant. He will restart anticoagulation 01/30/2022.  CXR pending.  Discharge this morning pending CXR.  For questions or updates, please contact [place] Please consult [email or web address] for contact info under        Signed, Vickie Epley, MD  01/25/2022, 7:27 AM

## 2022-01-25 NOTE — Progress Notes (Signed)
Went over discharge instructions with patient at the bedside. Patient verbalize that he has no questions. Patient verbalize that they "know they cant lift anything heavy and keep it dry". Patient made aware of follow up appointments with EP. PIV removed and telemonitor removed. CCMD notified of d/c. Patient is getting dressed. Transport is on the way.

## 2022-01-25 NOTE — Discharge Summary (Signed)
Discharge Summary    Patient ID: Kevin Collins MRN: [id number]; DOB: Sep 17, 1930  Admit date: 01/24/2022 Discharge date: 01/25/2022  PCP:  Neale Burly, MD   [facility] Providers Cardiologist:  Minus Breeding, MD  Electrophysiologist:  Vickie Epley, MD       Discharge Diagnoses    Principal Problem:   Symptomatic bradycardia Active Problems:   Atrial fibrillation [facility])    Diagnostic Studies/Procedures    CONCLUSIONS:  1. Successful implantation of a CRT-P BiV for symptomatic bradycardia and ischemic cardiomyopathy 2. No early apparent complications.  3. Hold apixaban for 5 days (resume 01/30/2022) PREPROCEDURE DIAGNOSIS: Symptomatic bradycardia, ischemic cardiomyopathy  POSTPROCEDURE DIAGNOSIS: Symptomatic bradycardia, ischemic cardiomyopathy  PROCEDURES:  1. Biventricular Pacemaker implantation.   INTRODUCTION: Kevin Collins is a 88 y.o. male with a history of symptomatic bradycardia, permanent atrial fibrillation, EF 40% and a baseline ECG showing atrial fibrillation and bradycardia who presents today for CRT-P implant.   DESCRIPTION OF PROCEDURE: Informed written consent was obtained, and the patient was brought to the electrophysiology lab in a fasting state. The patient was sedated using fentanyl and versed (details can be found in the anaesthesia record). The patients left chest and groins were prepped and draped in the usual sterile fashion by the EP lab staff. The skin overlying the left deltopectoral region was infiltrated with lidocaine for local analgesia. An incision was made over the left deltopectoral region with careful attention paid to hemostasis. A pacemaker pocket was fashioned immediately superficial to the pectoral fascia. Electrocautery was required to assure hemostasis.   Lead Placement:  Using ultrasound guidance, the left axillary vein was cannulated x 2. Through the left axillary vein, a right ventricular lead (model Tendril MRI [id number],  serial [id number]) was positioned on the right ventricular apical septum. It was found to have excellent pacing (0.4V @ 0.91ms) and sensing (10.12mV) thresholds and was secured to the pectoral fascia. The impedance was 594 Ohms.   Next, a Medtronic extended hook CS sheath and Wholley wire were used to cannulate the CS. A coronary sinus nonselective venogram confirmed cannulation of the CS and demonstrated a suitable lateral CS branch. A Whisper CS J wire was introduced and positioned in this branch. Over this wire, a quadripolar lead (Quartet [id number], serial [id number]) was advanced where it demonstrated excellent pacing thresholds without phrenic capture.  No atrial lead was placed because of his permanent atrial fibrillation. The atrial lead port was plugged.  The CS delivery sheath was then split and the lead secured to the pectoral fascia. Final checks of the leads again confirmed stable pacing and sensing thresholds.   The pocket then irrigated with copious vancomycin solution.   Device Placement:  The leads were then connected to a Quadra Allure MP [id number] generator (serial [id number]). The pocket was irrigated with copious gentamicin solution. The pacemaker was then placed into the pocket. The pocket was then closed in 3 layers with 2.0 Vicryl suture for the subcutaneous layers x 2 and 3.0 suture for the subcuticular layer. Steri- strips and a sterile dressing were then applied. EBL<71ml.  There were no early apparent complications.   Total sedation time 43 minutes.  [place] [id number] Model/Cat number: [id number]   Serial number: [id number] Manufacturer: [employer]    CONCLUSIONS:  1. Successful implantation of a CRT-P BiV for symptomatic bradycardia and ischemic cardiomyopathy 2. No early apparent complications.  3. Hold apixaban for 5 days (resume 01/30/2022)  Lars Mage, MD  2:24 PM 01/24/2022  _____________  History of Present Illness     Kevin Collins is a 88  y.o. male with a history of CABG in 2000, bradycardia, atrial fibrillation, hypertension, hyperlipidemia and peripheral neuropathy.  His ejection fraction is 40 to 45%.   He was evaluated in the office by Dr. Quentin Ore for increasing bradycardia with associated symptoms of fatigue and weakness.  He was scheduled for CRT-D insertion, and was instructed to hold his Eliquis for 3 days prior to the procedure.  Hospital Course     Consultants: None  He had the procedure listed above, and tolerated it well.  He had no issues overnight, and he was BiV pacing when the device was checked the next day.  On 2/11, he was seen by Dr. Quentin Ore and all data were reviewed.  There are no issues with the chest x-ray, the site was healing well without hematoma.  No further inpatient work-up is indicated and he is considered stable for discharge, to follow-up as an outpatient.  _____________  Discharge Vitals Blood pressure 110/62, pulse 68, temperature 97.8 F (36.6 C), temperature source Oral, resp. rate 13, height 5\' 10"  (1.778 m), weight 76.8 kg, SpO2 98 %.  Filed Weights   01/24/22 1002 01/24/22 1622 01/25/22 0453  Weight: 73.5 kg 77.4 kg 76.8 kg    Labs & Radiologic Studies    No results found.  Preliminary read by Dr Quentin Ore, NAD, leads ok _____________  EP PPM/ICD IMPLANT  Result Date: 01/24/2022 CONCLUSIONS: 1. Successful implantation of a CRT-P BiV for symptomatic bradycardia and ischemic cardiomyopathy 2. No early apparent complications. 3. Hold apixaban for 5 days (resume 01/30/2022)   ECHOCARDIOGRAM COMPLETE  Result Date: 01/14/2022    ECHOCARDIOGRAM REPORT   Patient Name:   Kevin Collins   Date of Exam: 01/14/2022 Medical Rec #:  [id number]     Height:       70.0 in Accession #:    [id number]    Weight:       171.2 lb Date of Birth:  01-09-1930     BSA:          1.954 m Patient Age:    61 years      BP:           118/60 mmHg Patient Gender: M             HR:           44 bpm. Exam Location:   [place] Procedure: 2D Echo, Cardiac Doppler, Color Doppler and Intracardiac            Opacification Agent Indications:    I48.21 Atrial fibrillation  History:        Patient has prior history of Echocardiogram examinations, most                 recent 09/02/2019. CHF, CAD, Prior CABG, Arrythmias:Atrial                 Fibrillation; Risk Factors:Hypertension and Dyslipidemia.  Sonographer:    Coralyn Helling RDCS Referring Phys: [id number]  1. Left ventricular ejection fraction, by estimation, is 40 to 45%. The left ventricle has mildly decreased function. The left ventricle demonstrates regional wall motion abnormalities (see scoring diagram/findings for description). The left ventricular  internal cavity size was mildly dilated. Left ventricular diastolic function could not be evaluated. There is hypokinesis of the left ventricular, entire inferoseptal wall, inferior wall and anteroseptal wall.  2. Right ventricular systolic function is low normal.  The right ventricular size is mildly enlarged. There is mildly elevated pulmonary artery systolic pressure. The estimated right ventricular systolic pressure is 56.3 mmHg.  3. Left atrial size was severely dilated.  4. Right atrial size was severely dilated.  5. The mitral valve is normal in structure. Mild mitral valve regurgitation. No evidence of mitral stenosis.  6. Tricuspid valve regurgitation is moderate.  7. The aortic valve is tricuspid. Aortic valve regurgitation is mild. Aortic valve sclerosis/calcification is present, without any evidence of aortic stenosis.  8. Aortic dilatation noted. There is borderline dilatation of the ascending aorta, measuring 38 mm.  9. The inferior vena cava is dilated in size with >50% respiratory variability, suggesting right atrial pressure of 8 mmHg. Comparison(s): No significant change from prior study. Conclusion(s)/Recommendation(s): Heart rate in the low 40 bpm range throughout study. FINDINGS   Left Ventricle: Left ventricular ejection fraction, by estimation, is 40 to 45%. The left ventricle has mildly decreased function. The left ventricle demonstrates regional wall motion abnormalities. Definity contrast agent was given IV to delineate the left ventricular endocardial borders. The left ventricular internal cavity size was mildly dilated. There is borderline concentric left ventricular hypertrophy. Left ventricular diastolic function could not be evaluated due to atrial fibrillation. Left ventricular diastolic function could not be evaluated. Right Ventricle: The right ventricular size is mildly enlarged. Right vetricular wall thickness was not well visualized. Right ventricular systolic function is low normal. There is mildly elevated pulmonary artery systolic pressure. The tricuspid regurgitant velocity is 2.76 m/s, and with an assumed right atrial pressure of 8 mmHg, the estimated right ventricular systolic pressure is 14.9 mmHg. Left Atrium: Left atrial size was severely dilated. Right Atrium: Right atrial size was severely dilated. Pericardium: There is no evidence of pericardial effusion. Mitral Valve: The mitral valve is normal in structure. Mild mitral valve regurgitation. No evidence of mitral valve stenosis. Tricuspid Valve: The tricuspid valve is normal in structure. Tricuspid valve regurgitation is moderate . No evidence of tricuspid stenosis. Aortic Valve: The aortic valve is tricuspid. Aortic valve regurgitation is mild. Aortic regurgitation PHT measures 481 msec. Aortic valve sclerosis/calcification is present, without any evidence of aortic stenosis. Pulmonic Valve: The pulmonic valve was grossly normal. Pulmonic valve regurgitation is mild. No evidence of pulmonic stenosis. Aorta: Aortic dilatation noted. There is borderline dilatation of the ascending aorta, measuring 38 mm. Venous: The inferior vena cava is dilated in size with greater than 50% respiratory variability, suggesting  right atrial pressure of 8 mmHg. IAS/Shunts: The atrial septum is grossly normal.  LEFT VENTRICLE PLAX 2D LVIDd:         5.80 cm LVIDs:         4.30 cm LV PW:         1.20 cm LV IVS:        1.10 cm LVOT diam:     1.80 cm LV SV:         75 LV SV Index:   38 LVOT Area:     2.54 cm  RIGHT VENTRICLE             IVC RV S prime:     10.32 cm/s  IVC diam: 2.30 cm TAPSE (M-mode): 1.6 cm RVSP:           38.5 mmHg LEFT ATRIUM              Index        RIGHT ATRIUM  Index LA diam:        5.50 cm  2.81 cm/m   RA Pressure: 8.00 mmHg LA Vol (A2C):   121.0 ml 61.93 ml/m  RA Area:     29.00 cm LA Vol (A4C):   115.0 ml 58.86 ml/m  RA Volume:   102.00 ml 52.20 ml/m LA Biplane Vol: 122.0 ml 62.44 ml/m  AORTIC VALVE LVOT Vmax:   142.00 cm/s LVOT Vmean:  86.740 cm/s LVOT VTI:    0.295 m AI PHT:      481 msec  AORTA Ao Root diam: 4.00 cm Ao Asc diam:  3.80 cm MITRAL VALVE                TRICUSPID VALVE MV Area (PHT): 3.63 cm     TR Peak grad:   30.5 mmHg MV Decel Time: 209 msec     TR Vmax:        276.00 cm/s MV E velocity: 127.60 cm/s  Estimated RAP:  8.00 mmHg                             RVSP:           38.5 mmHg                              SHUNTS                             Systemic VTI:  0.29 m                             Systemic Diam: 1.80 cm Buford Dresser MD Electronically signed by Buford Dresser MD Signature Date/Time: 01/14/2022/3:49:22 PM    Final    Disposition   Pt is being discharged home today in good condition.  Follow-up Plans & Appointments     Discharge Instructions     Diet - low sodium heart healthy   Complete by: As directed    Increase activity slowly   Complete by: As directed        Discharge Medications   Allergies as of 01/25/2022   No Known Allergies      Medication List     TAKE these medications    acetaminophen 325 MG tablet Commonly known as: TYLENOL Take 1-2 tablets (325-650 mg total) by mouth every 4 (four) hours as needed for mild pain.    apixaban 2.5 MG Tabs tablet Commonly known as: Eliquis Take 1 tablet (2.5 mg total) by mouth 2 (two) times daily. Notes to patient: HOLD for now, restart on 01/30/2022   cinacalcet 90 MG tablet Commonly known as: SENSIPAR Take 90 mg by mouth daily.   ezetimibe 10 MG tablet Commonly known as: ZETIA TAKE 1 TABLET BY MOUTH DAILY   Fish Oil 1200 MG Caps Take 1,200 mg by mouth 2 (two) times daily.   furosemide 20 MG tablet Commonly known as: LASIX Take 2 tablets (40 mg total) by mouth daily. What changed: how much to take   gabapentin 800 MG tablet Commonly known as: NEURONTIN Take 800 mg by mouth 3 (three) times daily.   lisinopril 20 MG tablet Commonly known as: ZESTRIL TAKE 1 TABLET DAILY (NEED APPOINTMENT FOR REFILLS)   oxyCODONE-acetaminophen 7.5-325 MG tablet Commonly known as: PERCOCET Take 1 tablet by mouth  3 (three) times daily.   PreserVision AREDS Caps Take 1 capsule by mouth 2 (two) times daily.   simvastatin 80 MG tablet Commonly known as: ZOCOR TAKE 1 TABLET BY MOUTH DAILY   spironolactone 25 MG tablet Commonly known as: ALDACTONE TAKE 1 TABLET DAILY           Outstanding Labs/Studies   Final read on CXR  Duration of Discharge Encounter   Greater than 30 minutes including physician time.  Signed, Rosaria Ferries, PA-C 01/25/2022, 1:45 PM

## 2022-01-27 ENCOUNTER — Encounter (HOSPITAL_COMMUNITY): Payer: Self-pay | Admitting: Cardiology

## 2022-02-05 ENCOUNTER — Other Ambulatory Visit: Payer: Self-pay

## 2022-02-05 ENCOUNTER — Ambulatory Visit (INDEPENDENT_AMBULATORY_CARE_PROVIDER_SITE_OTHER): Payer: Medicare PPO

## 2022-02-05 DIAGNOSIS — R001 Bradycardia, unspecified: Secondary | ICD-10-CM | POA: Diagnosis not present

## 2022-02-05 LAB — CUP PACEART INCLINIC DEVICE CHECK
Battery Remaining Longevity: 55 mo
Battery Voltage: 3.02 V
Brady Statistic RA Percent Paced: 0 %
Brady Statistic RV Percent Paced: 98 %
Date Time Interrogation Session: 20230222142706
Implantable Lead Implant Date: 20230210
Implantable Lead Implant Date: 20230210
Implantable Lead Location: 753858
Implantable Lead Location: 753860
Implantable Pulse Generator Implant Date: 20230210
Lead Channel Impedance Value: 412.5 Ohm
Lead Channel Impedance Value: 650 Ohm
Lead Channel Pacing Threshold Amplitude: 0.5 V
Lead Channel Pacing Threshold Amplitude: 1.5 V
Lead Channel Pacing Threshold Pulse Width: 0.5 ms
Lead Channel Pacing Threshold Pulse Width: 0.5 ms
Lead Channel Sensing Intrinsic Amplitude: 12 mV
Lead Channel Setting Pacing Amplitude: 3.5 V
Lead Channel Setting Pacing Amplitude: 3.5 V
Lead Channel Setting Pacing Pulse Width: 0.5 ms
Lead Channel Setting Pacing Pulse Width: 0.5 ms
Lead Channel Setting Sensing Sensitivity: 4 mV
Pulse Gen Model: 3562
Pulse Gen Serial Number: 3992207

## 2022-02-05 NOTE — Progress Notes (Signed)
Wound check appointment. Steri-strips removed. Wound without redness or edema. Incision edges approximated, wound well healed. Normal device function. Thresholds, sensing, and impedances consistent with implant measurements. Bi-V pacing 98%. Device programmed at 3.5V/auto capture programmed on for extra safety margin until 3 month visit. Histogram distribution appropriate for patient and level of activity. No high ventricular rates noted. Patient educated about wound care, arm mobility, lifting restrictions. Patient enrolled in remote monitoring with next transmission 05/06/22. ROV in 3 months with implanting physician.

## 2022-02-05 NOTE — Patient Instructions (Addendum)
° °  After Your Pacemaker   Monitor your pacemaker site for redness, swelling, and drainage. Call the device clinic at [phone] if you experience these symptoms or fever/chills.  Your incision was closed with Steri-strips or staples:  You may shower and wash your incision with soap and water. Avoid lotions, ointments, or perfumes over your incision until it is well-healed.  You may use a hot tub or a pool after your wound check appointment if the incision is completely closed.  Do not lift, push or pull greater than 10 pounds with the affected arm until 6 weeks after your procedure. There are no other restrictions in arm movement after your wound check appointment. March 26  You may drive, unless driving has been restricted by your healthcare providers.  Your Pacemaker is MRI compatible.  Remote monitoring is used to monitor your pacemaker from home. This monitoring is scheduled every 91 days by our office. It allows [place] to keep an eye on the functioning of your device to ensure it is working properly. You will routinely see your Electrophysiologist annually (more often if necessary).

## 2022-02-24 DIAGNOSIS — M10262 Drug-induced gout, left knee: Secondary | ICD-10-CM | POA: Diagnosis not present

## 2022-02-24 DIAGNOSIS — Z6825 Body mass index (BMI) 25.0-25.9, adult: Secondary | ICD-10-CM | POA: Diagnosis not present

## 2022-02-24 DIAGNOSIS — N182 Chronic kidney disease, stage 2 (mild): Secondary | ICD-10-CM | POA: Diagnosis not present

## 2022-02-24 DIAGNOSIS — Z0000 Encounter for general adult medical examination without abnormal findings: Secondary | ICD-10-CM | POA: Diagnosis not present

## 2022-02-24 DIAGNOSIS — I10 Essential (primary) hypertension: Secondary | ICD-10-CM | POA: Diagnosis not present

## 2022-02-24 DIAGNOSIS — R001 Bradycardia, unspecified: Secondary | ICD-10-CM | POA: Diagnosis not present

## 2022-02-24 DIAGNOSIS — I5032 Chronic diastolic (congestive) heart failure: Secondary | ICD-10-CM | POA: Diagnosis not present

## 2022-02-24 DIAGNOSIS — I480 Paroxysmal atrial fibrillation: Secondary | ICD-10-CM | POA: Diagnosis not present

## 2022-03-18 DIAGNOSIS — H40053 Ocular hypertension, bilateral: Secondary | ICD-10-CM | POA: Diagnosis not present

## 2022-03-18 DIAGNOSIS — H524 Presbyopia: Secondary | ICD-10-CM | POA: Diagnosis not present

## 2022-04-21 DIAGNOSIS — H401111 Primary open-angle glaucoma, right eye, mild stage: Secondary | ICD-10-CM | POA: Diagnosis not present

## 2022-04-29 ENCOUNTER — Encounter: Payer: Self-pay | Admitting: Cardiology

## 2022-04-29 ENCOUNTER — Other Ambulatory Visit: Payer: Self-pay | Admitting: Cardiology

## 2022-04-29 ENCOUNTER — Ambulatory Visit (INDEPENDENT_AMBULATORY_CARE_PROVIDER_SITE_OTHER): Payer: Medicare PPO | Admitting: Cardiology

## 2022-04-29 VITALS — BP 108/60 | HR 61 | Ht 69.0 in | Wt 164.4 lb

## 2022-04-29 DIAGNOSIS — I4821 Permanent atrial fibrillation: Secondary | ICD-10-CM | POA: Diagnosis not present

## 2022-04-29 DIAGNOSIS — Z950 Presence of cardiac pacemaker: Secondary | ICD-10-CM | POA: Diagnosis not present

## 2022-04-29 DIAGNOSIS — I5042 Chronic combined systolic (congestive) and diastolic (congestive) heart failure: Secondary | ICD-10-CM

## 2022-04-29 DIAGNOSIS — I2510 Atherosclerotic heart disease of native coronary artery without angina pectoris: Secondary | ICD-10-CM | POA: Diagnosis not present

## 2022-04-29 DIAGNOSIS — R001 Bradycardia, unspecified: Secondary | ICD-10-CM | POA: Diagnosis not present

## 2022-04-29 NOTE — Patient Instructions (Signed)
Medication Instructions:  ?Your physician recommends that you continue on your current medications as directed. Please refer to the Current Medication list given to you today. ? ?*If you need a refill on your cardiac medications before your next appointment, please call your pharmacy* ? ? ?Lab Work: ?None ?If you have labs (blood work) drawn today and your tests are completely normal, you will receive your results only by: ?MyChart Message (if you have MyChart) OR ?A paper copy in the mail ?If you have any lab test that is abnormal or we need to change your treatment, we will call you to review the results. ? ? ?Follow-Up: ?At [facility], you and your health needs are our priority.  As part of our continuing mission to provide you with exceptional heart care, we have created designated Provider Care Teams.  These Care Teams include your primary Cardiologist (physician) and Advanced Practice Providers (APPs -  Physician Assistants and Nurse Practitioners) who all work together to provide you with the care you need, when you need it. ? ?Your next appointment:   ?1 year(s) ? ?The format for your next appointment:   ?In Person ? ?Provider:   ?Lars Mage, MD  ? ?Important Information About Sugar ? ? ? ? ?  ?

## 2022-04-29 NOTE — Progress Notes (Signed)
?Electrophysiology Office Follow up Visit Note:   ? ?Date:  04/29/2022  ? ?ID:  Kevin Collins, DOB November 29, 1930, MRN [id number] ? ?PCP:  Neale Burly, MD  ?[facility] Cardiologist:  Minus Breeding, MD  ?[facility] Electrophysiologist:  Vickie Epley, MD  ? ? ?Interval History:   ? ?Kevin Collins is a 88 y.o. male who presents for a follow up visit. They were last seen in clinic 01/10/2022. ? ?Since their last appointment, he underwent successful implantation of a CRT-P BiV on 01/24/2022 for symptomatic bradycardia and ischemic cardiomyopathy. ? ?Overall, he appears well. Since his implant he denies any significant pain.  ? ?He has been trying to stay active. However, he is limited by numbness in his feet which he attributes to neuropathy. If he tries to walk after sitting for a prolonged time, he notes it feels like his left foot isn't there.  ? ?Lately he complains of dizzy spells. As long as he looks ahead and keeps walking, he does well. However he has a habit of looking downwards more frequently. He does not walk with a cane. ? ?In the last couple of days he has not been drinking as much liquids as he noticed the presence of rings around his sock, so he was concerned about swelling.  ? ?He denies any palpitations, chest pain, or shortness of breath. No  headaches, syncope, orthopnea, or PND. ? ? ? ?  ? ?Past Medical History:  ?Diagnosis Date  ? Bradycardia, unspecified   ? CAD (coronary artery disease)   ? NATIVE WITHOUT ANGINA  ? Hereditary and idiopathic neuropathy, unspecified   ? History of shingles   ? Hyperlipidemia   ? Hypertension   ? ESSENTIAL PRIMARY  ? Peripheral neuropathy   ? ? ?Past Surgical History:  ?Procedure Laterality Date  ? BIV PACEMAKER INSERTION CRT-P N/A 01/24/2022  ? Procedure: BIV PACEMAKER INSERTION CRT-P;  Surgeon: Vickie Epley, MD;  Location: [facility];  Service: Cardiovascular;  Laterality: N/A;  ? CARDIAC CATHETERIZATION Left 09/1999  ? NORMAL LEFT MAIN, OCCLUDED  LAD, OCCLUDED MID CFX, OCCLUDED PROXIMAL RCA, 60% STENOSIS PROXIMAL DIAG 1, RIGHT TO LEFT COLLATERAL, LEFT TO RIGHT COLLATERAL; LVEF OF 48% DOCUMENTED VIA NUCLEAR STUDY ON 07/12/2009.  ? CARDIOVERSION N/A 10/05/2019  ? Procedure: CARDIOVERSION;  Surgeon: Sanda Klein, MD;  [facility];  Service: Cardiovascular;  Laterality: N/A;  ? CATARACT EXTRACTION    ? CORONARY ARTERY BYPASS GRAFT    ? w/ LIMA TO LAD, SVG TO dx, SVG TO OM, SVG TO AM-PD-PL 10/12/99 [facility]  ? REMOVAL OF ANKLE PLATE    ? ? ?Current Medications: ?Current Meds  ?Medication Sig  ? apixaban (ELIQUIS) 2.5 MG TABS tablet Take 1 tablet (2.5 mg total) by mouth 2 (two) times daily.  ? bimatoprost (LUMIGAN) 0.01 % SOLN Place 1 drop into both eyes at bedtime.  ? cinacalcet (SENSIPAR) 90 MG tablet Take 90 mg by mouth daily.  ? ezetimibe (ZETIA) 10 MG tablet TAKE 1 TABLET BY MOUTH DAILY  ? furosemide (LASIX) 20 MG tablet Take 2 tablets (40 mg total) by mouth daily.  ? gabapentin (NEURONTIN) 800 MG tablet Take 800 mg by mouth 3 (three) times daily.  ? lisinopril (ZESTRIL) 20 MG tablet TAKE 1 TABLET DAILY (NEED APPOINTMENT FOR REFILLS)  ? Multiple Vitamins-Minerals (PRESERVISION AREDS) CAPS Take 1 capsule by mouth 2 (two) times daily.  ? Omega-3 Fatty Acids (FISH OIL) 1200 MG CAPS Take 1,200 mg by mouth 2 (two) times  daily.  ? oxyCODONE-acetaminophen (PERCOCET) 7.5-325 MG tablet Take 1 tablet by mouth 3 (three) times daily.  ? simvastatin (ZOCOR) 80 MG tablet TAKE 1 TABLET BY MOUTH DAILY  ? spironolactone (ALDACTONE) 25 MG tablet TAKE 1 TABLET DAILY  ?  ? ?Allergies:   Patient has no known allergies.  ? ?Social History  ? ?Socioeconomic History  ? Marital status: Divorced  ?  Spouse name: Not on file  ? Number of children: 1  ? Years of education: Not on file  ? Highest education level: Not on file  ?Occupational History  ? Occupation: RETIRED  ?Tobacco Use  ? Smoking status: Former  ? Smokeless tobacco: Never  ?Vaping Use  ? Vaping Use: Never  used  ?Substance and Sexual Activity  ? Alcohol use: Never  ? Drug use: Never  ? Sexual activity: Not on file  ?Other Topics Concern  ? Not on file  ?Social History Narrative  ? 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  He is retired Nature conservation officer.    ? ?Social Determinants of Health  ? ?Financial Resource Strain: Not on file  ?Food Insecurity: Not on file  ?Transportation Needs: Not on file  ?Physical Activity: Not on file  ?Stress: Not on file  ?Social Connections: Not on file  ?  ? ?Family History: ?The patient's family history includes CVA in his father; Other in his mother. ? ?ROS:   ?Please see the history of present illness.    ?(+) Dizziness ?(+) Imbalance ?(+) Numbness of bilateral feet ?All other systems reviewed and are negative. ? ?EKGs/Labs/Other Studies Reviewed:   ? ?The following studies were reviewed today: ? ?04/29/2022  In clinic device interrogation personally reviewed ?Battery longevity 8 years ?Lead parameters stable ?Reprogrammed LV and RV pulse amplitude to maximize battery longevity ?[place] impedance increased over the last few weeks ? ?01/24/2022  BIV Pacemaker Insertion CRT-P ?CONCLUSIONS:  ?1. Successful implantation of a CRT-P BiV for symptomatic bradycardia and ischemic cardiomyopathy ?2. No early apparent complications.  ?3. Hold apixaban for 5 days (resume 01/30/2022) ? ?Echo 01/14/2022: ? 1. Left ventricular ejection fraction, by estimation, is 40 to 45%. The  ?left ventricle has mildly decreased function. The left ventricle  ?demonstrates regional wall motion abnormalities (see scoring  ?diagram/findings for description). The left ventricular  ? internal cavity size was mildly dilated. Left ventricular diastolic  ?function could not be evaluated. There is hypokinesis of the left  ?ventricular, entire inferoseptal wall, inferior wall and anteroseptal  ?wall.  ? 2. Right ventricular systolic function is low normal. The right  ?ventricular size is mildly enlarged. There is mildly elevated pulmonary  ?artery  systolic pressure. The estimated right ventricular systolic  ?pressure is 09.9 mmHg.  ? 3. Left atrial size was severely dilated.  ? 4. Right atrial size was severely dilated.  ? 5. The mitral valve is normal in structure. Mild mitral valve  ?regurgitation. No evidence of mitral stenosis.  ? 6. Tricuspid valve regurgitation is moderate.  ? 7. The aortic valve is tricuspid. Aortic valve regurgitation is mild.  ?Aortic valve sclerosis/calcification is present, without any evidence of  ?aortic stenosis.  ? 8. Aortic dilatation noted. There is borderline dilatation of the  ?ascending aorta, measuring 38 mm.  ? 9. The inferior vena cava is dilated in size with >50% respiratory  ?variability, suggesting right atrial pressure of 8 mmHg.  ? ?Comparison(s): No significant change from prior study.  ? ?Conclusion(s)/Recommendation(s): Heart rate in the low 40 bpm range  ?throughout study.  ? ? ?  EKG:  EKG is personally reviewed.  ?04/29/2022: EKG was not ordered. ? ? ?Recent Labs: ?01/10/2022: BUN 31; Creatinine, Ser 1.89; Hemoglobin 11.2; Platelets 149; Potassium 4.9; Sodium 139  ? ?Recent Lipid Panel ?No results found for: CHOL, TRIG, HDL, CHOLHDL, VLDL, LDLCALC, LDLDIRECT ? ?Physical Exam:   ? ?VS:  BP 108/60   Pulse 61   Ht '5\' 9"'$  (1.753 m)   Wt 164 lb 6.4 oz (74.6 kg)   SpO2 93%   BMI 24.28 kg/m?    ? ?Wt Readings from Last 3 Encounters:  ?04/29/22 164 lb 6.4 oz (74.6 kg)  ?01/25/22 169 lb 5 oz (76.8 kg)  ?01/10/22 171 lb 3.2 oz (77.7 kg)  ?  ? ?GEN: Well nourished, well developed in no acute distress.  Appears younger than stated age ?HEENT: Normal ?NECK: No JVD; No carotid bruits ?LYMPHATICS: No lymphadenopathy ?CARDIAC: RRR, no murmurs, rubs, gallops.  Pacemaker pocket well-healed ?RESPIRATORY:  Clear to auscultation without rales, wheezing or rhonchi  ?ABDOMEN: Soft, non-tender, non-distended ?MUSCULOSKELETAL:  No edema; No deformity  ?SKIN: Warm and dry ?NEUROLOGIC:  Alert and oriented x 3 ?PSYCHIATRIC:  Normal  affect  ? ? ? ?  ? ?ASSESSMENT:   ? ?1. Permanent atrial fibrillation ([place])   ?2. Chronic combined systolic and diastolic HF (heart failure) ([place])   ?3. Coronary artery disease involving native coronary artery

## 2022-05-06 ENCOUNTER — Ambulatory Visit (INDEPENDENT_AMBULATORY_CARE_PROVIDER_SITE_OTHER): Payer: Medicare PPO

## 2022-05-06 DIAGNOSIS — I426 Alcoholic cardiomyopathy: Secondary | ICD-10-CM | POA: Diagnosis not present

## 2022-05-06 LAB — CUP PACEART REMOTE DEVICE CHECK
Battery Remaining Longevity: 96 mo
Battery Remaining Percentage: 95.5 %
Battery Voltage: 3.02 V
Date Time Interrogation Session: 20230523040010
Implantable Lead Implant Date: 20230210
Implantable Lead Implant Date: 20230210
Implantable Lead Location: 753858
Implantable Lead Location: 753860
Implantable Pulse Generator Implant Date: 20230210
Lead Channel Impedance Value: 460 Ohm
Lead Channel Impedance Value: 590 Ohm
Lead Channel Pacing Threshold Amplitude: 0.5 V
Lead Channel Pacing Threshold Amplitude: 1.5 V
Lead Channel Pacing Threshold Pulse Width: 0.5 ms
Lead Channel Pacing Threshold Pulse Width: 0.5 ms
Lead Channel Sensing Intrinsic Amplitude: 12 mV
Lead Channel Setting Pacing Amplitude: 2.25 V
Lead Channel Setting Pacing Amplitude: 2.5 V
Lead Channel Setting Pacing Pulse Width: 0.5 ms
Lead Channel Setting Pacing Pulse Width: 0.5 ms
Lead Channel Setting Sensing Sensitivity: 4 mV
Pulse Gen Model: 3562
Pulse Gen Serial Number: 3992207

## 2022-05-19 ENCOUNTER — Other Ambulatory Visit: Payer: Self-pay | Admitting: Cardiology

## 2022-05-20 NOTE — Progress Notes (Signed)
Remote pacemaker transmission.   

## 2022-05-24 IMAGING — CR DG CHEST 2V
2 series · 2 of 2 positions shown · non-contrast
Comparison: None

CLINICAL DATA: Bradycardia, symptomatic.

EXAM:
CHEST - 2 VIEW

[chest ap]
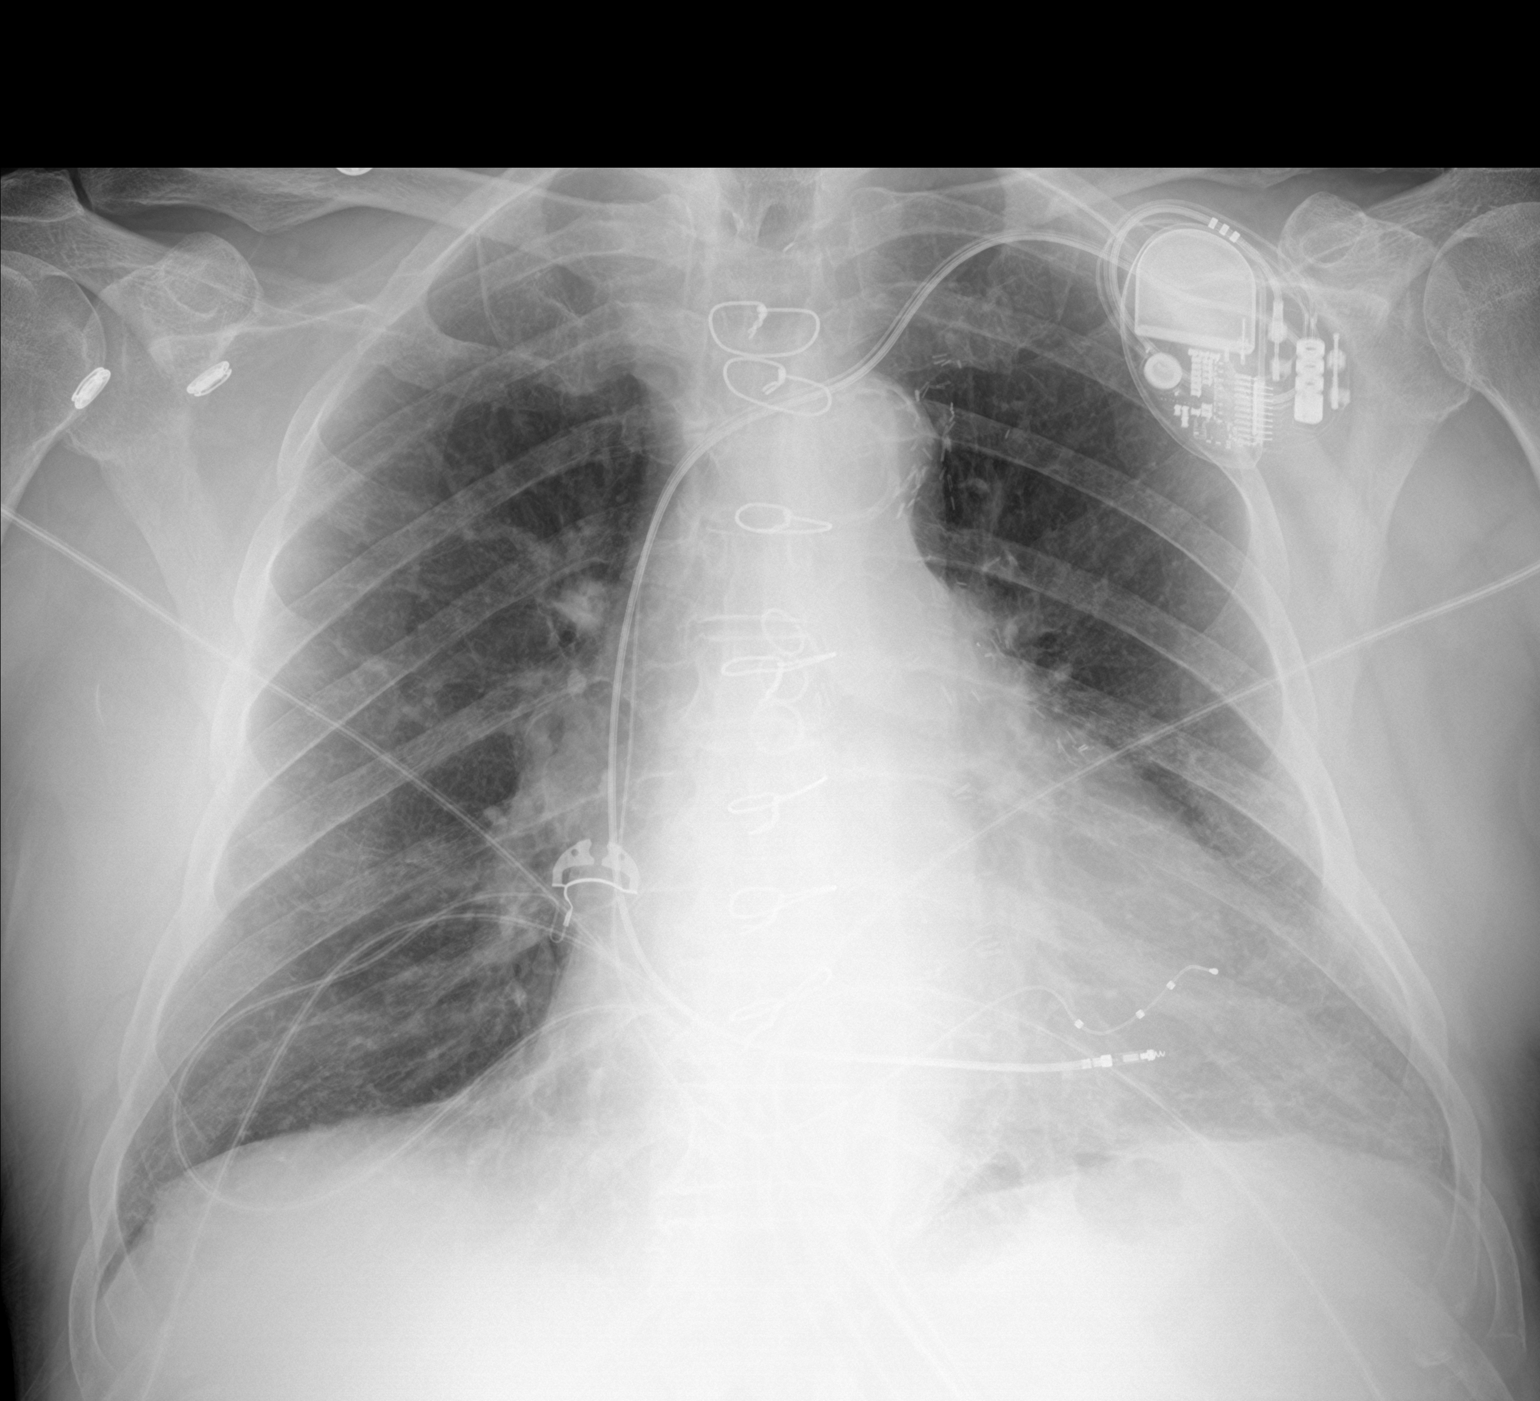

[chest lat]
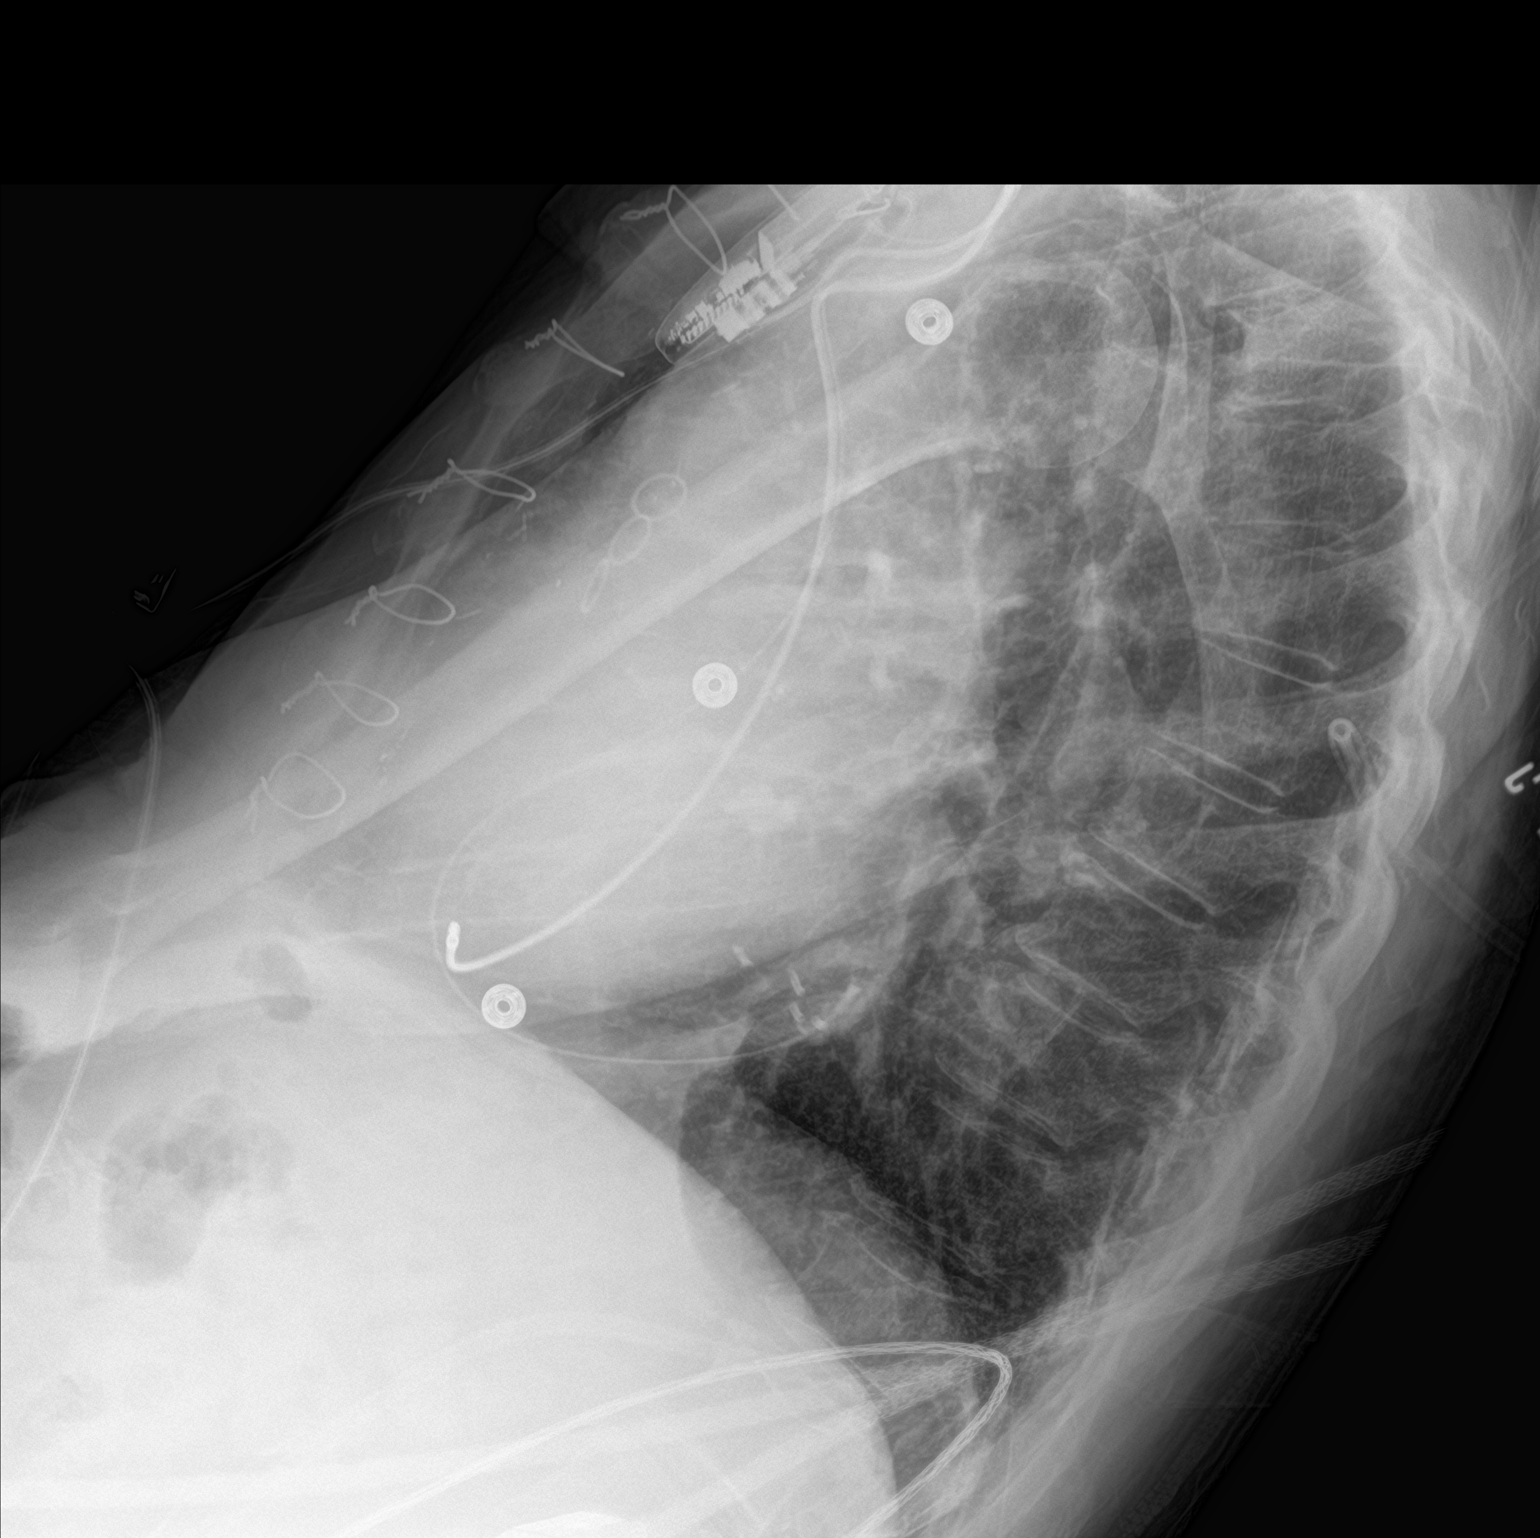

[2 of 2 positions shown; findings below may reference images not displayed]

FINDINGS: There is a left chest wall pacer device with leads in the coronary
sinus and right ventricle. Previous median sternotomy and CABG
procedure. Normal heart size. No pleural effusion or edema
identified. No airspace densities.
IMPRESSION: No active cardiopulmonary abnormalities.

## 2022-05-26 ENCOUNTER — Other Ambulatory Visit: Payer: Self-pay | Admitting: Cardiology

## 2022-05-29 DIAGNOSIS — M1009 Idiopathic gout, multiple sites: Secondary | ICD-10-CM | POA: Diagnosis not present

## 2022-05-29 DIAGNOSIS — I5032 Chronic diastolic (congestive) heart failure: Secondary | ICD-10-CM | POA: Diagnosis not present

## 2022-05-29 DIAGNOSIS — N182 Chronic kidney disease, stage 2 (mild): Secondary | ICD-10-CM | POA: Diagnosis not present

## 2022-05-29 DIAGNOSIS — Z6824 Body mass index (BMI) 24.0-24.9, adult: Secondary | ICD-10-CM | POA: Diagnosis not present

## 2022-05-29 DIAGNOSIS — Z0000 Encounter for general adult medical examination without abnormal findings: Secondary | ICD-10-CM | POA: Diagnosis not present

## 2022-05-29 DIAGNOSIS — I480 Paroxysmal atrial fibrillation: Secondary | ICD-10-CM | POA: Diagnosis not present

## 2022-05-29 DIAGNOSIS — I10 Essential (primary) hypertension: Secondary | ICD-10-CM | POA: Diagnosis not present

## 2022-05-29 DIAGNOSIS — R001 Bradycardia, unspecified: Secondary | ICD-10-CM | POA: Diagnosis not present

## 2022-06-12 DIAGNOSIS — M810 Age-related osteoporosis without current pathological fracture: Secondary | ICD-10-CM | POA: Diagnosis not present

## 2022-06-23 ENCOUNTER — Other Ambulatory Visit: Payer: Self-pay | Admitting: Cardiology

## 2022-07-14 ENCOUNTER — Other Ambulatory Visit: Payer: Self-pay | Admitting: Cardiology

## 2022-07-21 NOTE — Progress Notes (Unsigned)
Cardiology Office Note   Date:  07/23/2022   ID:  Kevin Collins, DOB 03-12-1930, MRN [id number]  PCP:  Neale Burly, MD  Cardiologist:   Minus Breeding, MD   Chief Complaint  Patient presents with   Dizziness       History of Present Illness: Kevin Collins is a 88 y.o. male with a history of CAD s/p CABG in 10/1999 (LIMA-LAD, SVG-OM, SVG-diagonal, SVG-PDA/PL), history of bradycardia, atrial fib, HTN, HLD and peripheral neuropathy and EF 40 to 45%.  He has had cardioversion in 09/2019.  However, he went back into atrial fib.  he underwent successful implantation of a CRT-P BiV on 01/24/2022.   This was for symptomatic bradycardia.    He says he probably has felt little better since he had this device.  His only complaint is that he occasionally gets dizzy when he bends over and has to stand up quickly.  He denies any palpitations.  He is not having any chest pressure, neck or arm discomfort.  He has had no new shortness of breath, PND or orthopnea.  He had no weight gain or edema.   Past Medical History:  Diagnosis Date   Bradycardia, unspecified    CAD (coronary artery disease)    NATIVE WITHOUT ANGINA   Hereditary and idiopathic neuropathy, unspecified    History of shingles    Hyperlipidemia    Hypertension    ESSENTIAL PRIMARY   Peripheral neuropathy     Past Surgical History:  Procedure Laterality Date   BIV PACEMAKER INSERTION CRT-P N/A 01/24/2022   Procedure: BIV PACEMAKER INSERTION CRT-P;  Surgeon: Vickie Epley, MD;  Location: [facility];  Service: Cardiovascular;  Laterality: N/A;   CARDIAC CATHETERIZATION Left 09/1999   NORMAL LEFT MAIN, OCCLUDED LAD, OCCLUDED MID CFX, OCCLUDED PROXIMAL RCA, 60% STENOSIS PROXIMAL DIAG 1, RIGHT TO LEFT COLLATERAL, LEFT TO RIGHT COLLATERAL; LVEF OF 48% DOCUMENTED VIA NUCLEAR STUDY ON 07/12/2009.   CARDIOVERSION N/A 10/05/2019   Procedure: CARDIOVERSION;  Surgeon: Sanda Klein, MD;  [facility];  Service:  Cardiovascular;  Laterality: N/A;   CATARACT EXTRACTION     CORONARY ARTERY BYPASS GRAFT     w/ LIMA TO LAD, SVG TO dx, SVG TO OM, SVG TO AM-PD-PL 10/12/99 [facility]   REMOVAL OF ANKLE PLATE       Current Outpatient Medications  Medication Sig Dispense Refill   apixaban (ELIQUIS) 2.5 MG TABS tablet Take 1 tablet (2.5 mg total) by mouth 2 (two) times daily. 180 tablet 3   bimatoprost (LUMIGAN) 0.01 % SOLN Place 1 drop into both eyes at bedtime.     cinacalcet (SENSIPAR) 90 MG tablet Take 90 mg by mouth daily.     ezetimibe (ZETIA) 10 MG tablet TAKE 1 TABLET DAILY 90 tablet 1   furosemide (LASIX) 20 MG tablet Take 1 tablet (20 mg total) by mouth daily. 90 tablet 3   gabapentin (NEURONTIN) 800 MG tablet Take 800 mg by mouth 3 (three) times daily.     lisinopril (ZESTRIL) 20 MG tablet TAKE 1 TABLET DAILY (NEED APPOINTMENT FOR REFILLS) 90 tablet 3   Multiple Vitamins-Minerals (PRESERVISION AREDS) CAPS Take 1 capsule by mouth 2 (two) times daily.     Omega-3 Fatty Acids (FISH OIL) 1200 MG CAPS Take 1,200 mg by mouth 2 (two) times daily.     oxyCODONE-acetaminophen (PERCOCET) 7.5-325 MG tablet Take 1 tablet by mouth 3 (three) times daily.     simvastatin (ZOCOR) 80 MG tablet  TAKE 1 TABLET DAILY 90 tablet 3   spironolactone (ALDACTONE) 25 MG tablet TAKE 1 TABLET DAILY 90 tablet 3   No current facility-administered medications for this visit.    Allergies:   Patient has no known allergies.    ROS:  Please see the history of present illness.   Otherwise, review of systems are positive for none.   All other systems are reviewed and negative.    PHYSICAL EXAM: VS:  BP 100/60   Pulse 60   Ht '5\' 9"'$  (1.753 m)   Wt 162 lb (73.5 kg)   BMI 23.92 kg/m  , BMI Body mass index is 23.92 kg/m. GENERAL:  Well appearing NECK:  No jugular venous distention, waveform within normal limits, carotid upstroke brisk and symmetric, no bruits, no thyromegaly LUNGS:  Clear to auscultation bilaterally CHEST:  Well-healed ICD and sternotomy scars HEART:  PMI not displaced or sustained,S1 and S2 within normal limits, no S3, no clicks, no rubs, no murmurs ABD:  Flat, positive bowel sounds normal in frequency in pitch, no bruits, no rebound, no guarding, no midline pulsatile mass, no hepatomegaly, no splenomegaly EXT:  2 plus pulses throughout, no edema, no cyanosis no clubbing   EKG:  EKG is  ordered today. Atrial fibrillation, rate 60, with 100% pacing   Recent Labs: 01/10/2022: BUN 31; Creatinine, Ser 1.89; Hemoglobin 11.2; Platelets 149; Potassium 4.9; Sodium 139    Lipid Panel No results found for: "CHOL", "TRIG", "HDL", "CHOLHDL", "VLDL", "LDLCALC", "LDLDIRECT"    Wt Readings from Last 3 Encounters:  07/23/22 162 lb (73.5 kg)  04/29/22 164 lb 6.4 oz (74.6 kg)  01/25/22 169 lb 5 oz (76.8 kg)      Other studies Reviewed: Additional studies/ records that were reviewed today include:  None Review of the above records demonstrates: See elsewhere   ASSESSMENT AND PLAN:   Persistent atrial fibrillation:    Kevin Collins has a CHA2DS2 - VASc score of 4 he tolerates anticoagulation.  No change in therapy.  Bradycardia: He is now status post pacemaker placement as above.  He will follow in the device clinic.  CAD s/p CABG:    He has no new anginal symptoms.  He will continue with risk reduction.  Hypertension:   The blood pressure is low.  I am going to reduce his Lasix to 20 mg daily.  He will let me know if he has any increasing dizziness and I might have to back off on his lisinopril.   Chronic systolic and diastolic heart failure:   Again I will make the changes as above.  He will resume the extra Lasix if he starts getting short of breath.   AI:  This was mild in 2020    Hyperlipidemia:   LDL was 50 with an HDL of 52.  No change in therapy.    Current medicines are reviewed at length with the patient today.  The patient does not have concerns regarding medicines.  The  following changes have been made: As above  Labs/ tests ordered today include:    None  Orders Placed This Encounter  Procedures   EKG 12-Lead      Disposition:   FU with me in 12 months.    Signed, Minus Breeding, MD  07/23/2022 1:33 PM    [facility]

## 2022-07-23 ENCOUNTER — Encounter: Payer: Self-pay | Admitting: Cardiology

## 2022-07-23 ENCOUNTER — Ambulatory Visit (INDEPENDENT_AMBULATORY_CARE_PROVIDER_SITE_OTHER): Payer: Medicare PPO | Admitting: Cardiology

## 2022-07-23 VITALS — BP 100/60 | HR 60 | Ht 69.0 in | Wt 162.0 lb

## 2022-07-23 DIAGNOSIS — E785 Hyperlipidemia, unspecified: Secondary | ICD-10-CM | POA: Diagnosis not present

## 2022-07-23 DIAGNOSIS — I351 Nonrheumatic aortic (valve) insufficiency: Secondary | ICD-10-CM

## 2022-07-23 DIAGNOSIS — I4819 Other persistent atrial fibrillation: Secondary | ICD-10-CM | POA: Diagnosis not present

## 2022-07-23 DIAGNOSIS — I2510 Atherosclerotic heart disease of native coronary artery without angina pectoris: Secondary | ICD-10-CM | POA: Diagnosis not present

## 2022-07-23 DIAGNOSIS — I5023 Acute on chronic systolic (congestive) heart failure: Secondary | ICD-10-CM | POA: Diagnosis not present

## 2022-07-23 DIAGNOSIS — I10 Essential (primary) hypertension: Secondary | ICD-10-CM | POA: Diagnosis not present

## 2022-07-23 MED ORDER — FUROSEMIDE 20 MG PO TABS: 20.0000 mg | ORAL_TABLET | Freq: Every day | ORAL | 3 refills | Status: DC

## 2022-07-23 NOTE — Patient Instructions (Signed)
Medication Instructions:  Please take Furosemide 20 mg a day. Continue all other medications as listed.  *If you need a refill on your cardiac medications before your next appointment, please call your pharmacy  Follow-Up: At [facility], you and your health needs are our priority.  As part of our continuing mission to provide you with exceptional heart care, we have created designated Provider Care Teams.  These Care Teams include your primary Cardiologist (physician) and Advanced Practice Providers (APPs -  Physician Assistants and Nurse Practitioners) who all work together to provide you with the care you need, when you need it.  We recommend signing up for the patient portal called "MyChart".  Sign up information is provided on this After Visit Summary.  MyChart is used to connect with patients for Virtual Visits (Telemedicine).  Patients are able to view lab/test results, encounter notes, upcoming appointments, etc.  Non-urgent messages can be sent to your provider as well.   To learn more about what you can do with MyChart, go to [email or web address].    Your next appointment:   1 year(s)  The format for your next appointment:   In Person  Provider:   Minus Breeding, MD{   Important Information About Sugar

## 2022-07-28 DIAGNOSIS — H26492 Other secondary cataract, left eye: Secondary | ICD-10-CM | POA: Diagnosis not present

## 2022-07-28 DIAGNOSIS — H353133 Nonexudative age-related macular degeneration, bilateral, advanced atrophic without subfoveal involvement: Secondary | ICD-10-CM | POA: Diagnosis not present

## 2022-07-28 DIAGNOSIS — H40012 Open angle with borderline findings, low risk, left eye: Secondary | ICD-10-CM | POA: Diagnosis not present

## 2022-07-28 DIAGNOSIS — H401111 Primary open-angle glaucoma, right eye, mild stage: Secondary | ICD-10-CM | POA: Diagnosis not present

## 2022-08-05 ENCOUNTER — Ambulatory Visit (INDEPENDENT_AMBULATORY_CARE_PROVIDER_SITE_OTHER): Payer: Medicare PPO

## 2022-08-05 DIAGNOSIS — I4819 Other persistent atrial fibrillation: Secondary | ICD-10-CM | POA: Diagnosis not present

## 2022-08-05 LAB — CUP PACEART REMOTE DEVICE CHECK
Battery Remaining Longevity: 92 mo
Battery Remaining Percentage: 95 %
Battery Voltage: 3.01 V
Date Time Interrogation Session: 20230822040009
Implantable Lead Implant Date: 20230210
Implantable Lead Implant Date: 20230210
Implantable Lead Location: 753858
Implantable Lead Location: 753860
Implantable Pulse Generator Implant Date: 20230210
Lead Channel Impedance Value: 460 Ohm
Lead Channel Impedance Value: 510 Ohm
Lead Channel Pacing Threshold Amplitude: 0.5 V
Lead Channel Pacing Threshold Amplitude: 1.5 V
Lead Channel Pacing Threshold Pulse Width: 0.5 ms
Lead Channel Pacing Threshold Pulse Width: 0.5 ms
Lead Channel Sensing Intrinsic Amplitude: 12 mV
Lead Channel Setting Pacing Amplitude: 2.25 V
Lead Channel Setting Pacing Amplitude: 2.5 V
Lead Channel Setting Pacing Pulse Width: 0.5 ms
Lead Channel Setting Pacing Pulse Width: 0.5 ms
Lead Channel Setting Sensing Sensitivity: 4 mV
Pulse Gen Model: 3562
Pulse Gen Serial Number: 3992207

## 2022-08-28 DIAGNOSIS — N182 Chronic kidney disease, stage 2 (mild): Secondary | ICD-10-CM | POA: Diagnosis not present

## 2022-08-28 DIAGNOSIS — R001 Bradycardia, unspecified: Secondary | ICD-10-CM | POA: Diagnosis not present

## 2022-08-28 DIAGNOSIS — I5032 Chronic diastolic (congestive) heart failure: Secondary | ICD-10-CM | POA: Diagnosis not present

## 2022-08-28 DIAGNOSIS — I480 Paroxysmal atrial fibrillation: Secondary | ICD-10-CM | POA: Diagnosis not present

## 2022-08-28 DIAGNOSIS — M1009 Idiopathic gout, multiple sites: Secondary | ICD-10-CM | POA: Diagnosis not present

## 2022-08-28 DIAGNOSIS — I10 Essential (primary) hypertension: Secondary | ICD-10-CM | POA: Diagnosis not present

## 2022-08-28 DIAGNOSIS — N1832 Chronic kidney disease, stage 3b: Secondary | ICD-10-CM | POA: Diagnosis not present

## 2022-08-28 DIAGNOSIS — Z6825 Body mass index (BMI) 25.0-25.9, adult: Secondary | ICD-10-CM | POA: Diagnosis not present

## 2022-08-29 ENCOUNTER — Telehealth: Payer: Self-pay

## 2022-08-29 NOTE — Telephone Encounter (Signed)
Kevin Collins,  Would this patient be good candidate for ICM follow?  Patient reports Dr. Percival Spanish reduced his Lasix to 20 mg daily due to dizziness. Patient denies any fluid retention at this time. Also, reports improvement with his dizziness and no episodes since reducing his Lasix.   Patient advised I will forward to Dr. Quentin Ore and Dr. Percival Spanish for review and someone will call if any changes are recommended. Patient voiced understanding and appreciative of call.

## 2022-09-02 NOTE — Progress Notes (Signed)
Remote pacemaker transmission.   

## 2022-09-17 DIAGNOSIS — H401111 Primary open-angle glaucoma, right eye, mild stage: Secondary | ICD-10-CM | POA: Diagnosis not present

## 2022-09-17 NOTE — Telephone Encounter (Signed)
Spoke with patient and ICM intro given.  Patient agreed to monthly ICM follow up.   He is doing well at this time.  He reports he knows if there is puffiness in his feet and around his sock line that he has fluid.  At this time he does not have any swelling.  He reports he is probably drinking too much fluid at times and drinks ice water all the time.  Discussed limiting fluid intake to 64 oz daily.  Checked remote transmission on 10/4 and review remote transmission results which suggesting normal fluid levels.  He has monitor by bedside but he does have a poor cell phone signal where he lives.  Advised will scheduled fluid level check on 10/23 and will call him with the results. Provided ICM number and encouraged to call if experiencing fluid symptoms.

## 2022-10-06 ENCOUNTER — Ambulatory Visit (INDEPENDENT_AMBULATORY_CARE_PROVIDER_SITE_OTHER): Payer: Medicare PPO

## 2022-10-06 DIAGNOSIS — Z950 Presence of cardiac pacemaker: Secondary | ICD-10-CM

## 2022-10-06 DIAGNOSIS — I5042 Chronic combined systolic (congestive) and diastolic (congestive) heart failure: Secondary | ICD-10-CM

## 2022-10-06 NOTE — Progress Notes (Signed)
EPIC Encounter for ICM Monitoring  Patient Name: Kevin Collins is a 88 y.o. male Date: 10/06/2022 Primary Care Physican: Neale Burly, MD Primary Cardiologist: Hochrein Electrophysiologist: Marisa Sprinkles Pacing: 93%  10/07/2022 Weight: 156 lbs        1st ICM Remote Transmission.  Heart Failure questions reviewed.  Pt asymptomatic and he is feeling well.   CorVue thoracic impedance suggesting normal fluid levels since 09/22/2022.   Prescribed:  Furosemide 20 mg take 1 tablet(s) (20 mg total) by mouth daily. Spironolactone 25 mg take 1 tablet daily  Labs: 01/10/2022 Creatinine 1.89, BUN 31, Potassium 4.9, Sodium 139, GFR 34 A complete set of results can be found in Results Review.  Recommendations: Reinforced limiting salt intake to < 2000 mg daily and fluid intake to 64 oz daily.  Encouraged to call if experiencing fluid symptoms.  Follow-up plan: ICM clinic phone appointment on 11/10/2022.   91 day device clinic remote transmission 11/04/2022.    EP/Cardiology Office Visits:  Recall 04/24/2023 with Dr. Quentin Ore.  Recall 07/18/2023 with Dr Percival Spanish.    Copy of ICM check sent to Dr. Quentin Ore.   3 month ICM trend: 10/06/2022.    12-14 Month ICM trend:     Rosalene Billings, RN 10/06/2022 8:34 AM

## 2022-11-04 ENCOUNTER — Ambulatory Visit (INDEPENDENT_AMBULATORY_CARE_PROVIDER_SITE_OTHER): Payer: Medicare PPO

## 2022-11-04 DIAGNOSIS — I4819 Other persistent atrial fibrillation: Secondary | ICD-10-CM

## 2022-11-04 LAB — CUP PACEART REMOTE DEVICE CHECK
Battery Remaining Longevity: 89 mo
Battery Remaining Percentage: 93 %
Battery Voltage: 3.01 V
Date Time Interrogation Session: 20231121040009
Implantable Lead Connection Status: 753985
Implantable Lead Connection Status: 753985
Implantable Lead Implant Date: 20230210
Implantable Lead Implant Date: 20230210
Implantable Lead Location: 753858
Implantable Lead Location: 753860
Implantable Pulse Generator Implant Date: 20230210
Lead Channel Impedance Value: 460 Ohm
Lead Channel Impedance Value: 490 Ohm
Lead Channel Pacing Threshold Amplitude: 0.5 V
Lead Channel Pacing Threshold Amplitude: 1.5 V
Lead Channel Pacing Threshold Pulse Width: 0.5 ms
Lead Channel Pacing Threshold Pulse Width: 0.5 ms
Lead Channel Sensing Intrinsic Amplitude: 12 mV
Lead Channel Setting Pacing Amplitude: 2.25 V
Lead Channel Setting Pacing Amplitude: 2.5 V
Lead Channel Setting Pacing Pulse Width: 0.5 ms
Lead Channel Setting Pacing Pulse Width: 0.5 ms
Lead Channel Setting Sensing Sensitivity: 4 mV
Pulse Gen Model: 3562
Pulse Gen Serial Number: 3992207

## 2022-11-10 ENCOUNTER — Ambulatory Visit (INDEPENDENT_AMBULATORY_CARE_PROVIDER_SITE_OTHER): Payer: Medicare PPO

## 2022-11-10 DIAGNOSIS — Z950 Presence of cardiac pacemaker: Secondary | ICD-10-CM | POA: Diagnosis not present

## 2022-11-10 DIAGNOSIS — I5042 Chronic combined systolic (congestive) and diastolic (congestive) heart failure: Secondary | ICD-10-CM

## 2022-11-12 ENCOUNTER — Telehealth: Payer: Self-pay

## 2022-11-12 NOTE — Telephone Encounter (Signed)
Remote ICM transmission received.  Attempted call to patient regarding ICM remote transmission and no answer.  

## 2022-11-12 NOTE — Progress Notes (Signed)
EPIC Encounter for ICM Monitoring  Patient Name: Kamrin Sibley is a 88 y.o. male Date: 11/12/2022 Primary Care Physican: Neale Burly, MD Primary Cardiologist: Hochrein Electrophysiologist: Marisa Sprinkles Pacing: 93%            10/07/2022 Weight: 156 lbs                                                             Attempted call to patient and unable to reach.  Transmission reviewed.    CorVue thoracic impedance suggesting normal fluid levels since 10/23/2022.  Suggesting possible fluid accumulation from 10/24-11/9.   Prescribed:  Furosemide 20 mg take 1 tablet(s) (20 mg total) by mouth daily. Spironolactone 25 mg take 1 tablet daily   Labs: 01/10/2022 Creatinine 1.89, BUN 31, Potassium 4.9, Sodium 139, GFR 34 A complete set of results can be found in Results Review.   Recommendations: Unable to reach.     Follow-up plan: ICM clinic phone appointment on 12/22/2022.   91 day device clinic remote transmission 02/03/2023.     EP/Cardiology Office Visits:  Recall 04/24/2023 with Dr. Quentin Ore.  Recall 07/18/2023 with Dr Percival Spanish.     Copy of ICM check sent to Dr. Quentin Ore.    3 month ICM trend: 11/10/2022.    12-14 Month ICM trend:     Rosalene Billings, RN 11/12/2022 6:56 AM

## 2022-11-28 NOTE — Progress Notes (Signed)
Remote pacemaker transmission.   

## 2022-12-02 ENCOUNTER — Other Ambulatory Visit: Payer: Self-pay | Admitting: Cardiology

## 2022-12-02 DIAGNOSIS — I4891 Unspecified atrial fibrillation: Secondary | ICD-10-CM

## 2022-12-02 NOTE — Telephone Encounter (Signed)
Prescription refill request for Eliquis received. Indication:afib Last office visit:8/23 Scr:1.8 Age:88  Weight:73.5  kg  Prescription refilled

## 2022-12-09 DIAGNOSIS — N1832 Chronic kidney disease, stage 3b: Secondary | ICD-10-CM | POA: Diagnosis not present

## 2022-12-09 DIAGNOSIS — M1009 Idiopathic gout, multiple sites: Secondary | ICD-10-CM | POA: Diagnosis not present

## 2022-12-09 DIAGNOSIS — I5032 Chronic diastolic (congestive) heart failure: Secondary | ICD-10-CM | POA: Diagnosis not present

## 2022-12-09 DIAGNOSIS — I10 Essential (primary) hypertension: Secondary | ICD-10-CM | POA: Diagnosis not present

## 2022-12-09 DIAGNOSIS — I480 Paroxysmal atrial fibrillation: Secondary | ICD-10-CM | POA: Diagnosis not present

## 2022-12-09 DIAGNOSIS — R001 Bradycardia, unspecified: Secondary | ICD-10-CM | POA: Diagnosis not present

## 2022-12-09 DIAGNOSIS — Z6824 Body mass index (BMI) 24.0-24.9, adult: Secondary | ICD-10-CM | POA: Diagnosis not present

## 2022-12-22 ENCOUNTER — Ambulatory Visit (INDEPENDENT_AMBULATORY_CARE_PROVIDER_SITE_OTHER): Payer: Medicare PPO

## 2022-12-22 DIAGNOSIS — I5042 Chronic combined systolic (congestive) and diastolic (congestive) heart failure: Secondary | ICD-10-CM

## 2022-12-22 DIAGNOSIS — Z950 Presence of cardiac pacemaker: Secondary | ICD-10-CM

## 2022-12-23 ENCOUNTER — Telehealth: Payer: Self-pay

## 2022-12-23 NOTE — Telephone Encounter (Signed)
Remote ICM transmission received.  Attempted call to patient regarding ICM remote transmission and left detailed message per DPR.  Advised to return call for any fluid symptoms or questions. Next ICM remote transmission scheduled 01/26/2023.

## 2022-12-23 NOTE — Progress Notes (Signed)
EPIC Encounter for ICM Monitoring  Patient Name: Kevin Collins is a 89 y.o. male Date: 12/23/2022 Primary Care Physican: Neale Burly, MD Primary Cardiologist: Hochrein Electrophysiologist: Marisa Sprinkles Pacing: 93%            10/07/2022 Weight: 156 lbs                                                             Attempted call to patient and unable to reach.  Transmission reviewed.    CorVue thoracic impedance suggesting normal fluid levels.   Prescribed:  Furosemide 20 mg take 1 tablet(s) (20 mg total) by mouth daily. Spironolactone 25 mg take 1 tablet daily   Labs: 01/10/2022 Creatinine 1.89, BUN 31, Potassium 4.9, Sodium 139, GFR 34 A complete set of results can be found in Results Review.   Recommendations: Unable to reach.     Follow-up plan: ICM clinic phone appointment on 01/26/2023.   91 day device clinic remote transmission 02/03/2023.     EP/Cardiology Office Visits:  Recall 04/24/2023 with Dr. Quentin Ore.  Recall 07/18/2023 with Dr Percival Spanish.     Copy of ICM check sent to Dr. Quentin Ore.    3 month ICM trend: 12/22/2022.    12-14 Month ICM trend:     Rosalene Billings, RN 12/23/2022 4:54 PM

## 2023-01-14 ENCOUNTER — Other Ambulatory Visit: Payer: Self-pay | Admitting: Cardiology

## 2023-01-26 ENCOUNTER — Ambulatory Visit: Payer: Medicare PPO | Attending: Cardiology

## 2023-01-26 DIAGNOSIS — I5042 Chronic combined systolic (congestive) and diastolic (congestive) heart failure: Secondary | ICD-10-CM | POA: Diagnosis not present

## 2023-01-26 DIAGNOSIS — Z950 Presence of cardiac pacemaker: Secondary | ICD-10-CM

## 2023-01-27 ENCOUNTER — Telehealth: Payer: Self-pay

## 2023-01-27 NOTE — Telephone Encounter (Signed)
Remote ICM transmission received.  Attempted call to patient regarding ICM remote transmission and no answer.  

## 2023-01-27 NOTE — Progress Notes (Signed)
EPIC Encounter for ICM Monitoring  Patient Name: Kevin Collins is a 89 y.o. male Date: 01/27/2023 Primary Care Physican: Neale Burly, MD Primary Cardiologist: Hochrein Electrophysiologist: Marisa Sprinkles Pacing: 93%            10/07/2022 Weight: 156 lbs                                                             Attempted call to patient and unable to reach.  Transmission reviewed.    CorVue thoracic impedance suggesting possible fluid accumulation starting 2/4 and returned to baseline 2/12.   Prescribed:  Furosemide 20 mg take 1 tablet(s) (20 mg total) by mouth daily. Spironolactone 25 mg take 1 tablet daily   Labs: 01/10/2022 Creatinine 1.89, BUN 31, Potassium 4.9, Sodium 139, GFR 34 A complete set of results can be found in Results Review.   Recommendations: Unable to reach.     Follow-up plan: ICM clinic phone appointment on 03/02/2023.   91 day device clinic remote transmission 02/03/2023.     EP/Cardiology Office Visits:  Recall 04/24/2023 with Dr. Quentin Ore.  Recall 07/18/2023 with Dr Percival Spanish.     Copy of ICM check sent to Dr. Quentin Ore.    Direct Trend:   3 month ICM trend: 01/26/2023.    12-14 Month ICM trend:     Rosalene Billings, RN 01/27/2023 4:28 PM

## 2023-02-03 ENCOUNTER — Ambulatory Visit (INDEPENDENT_AMBULATORY_CARE_PROVIDER_SITE_OTHER): Payer: Medicare PPO

## 2023-02-03 DIAGNOSIS — I5042 Chronic combined systolic (congestive) and diastolic (congestive) heart failure: Secondary | ICD-10-CM

## 2023-02-03 LAB — CUP PACEART REMOTE DEVICE CHECK
Battery Remaining Longevity: 86 mo
Battery Remaining Percentage: 90 %
Battery Voltage: 3.01 V
Date Time Interrogation Session: 20240219020007
Implantable Lead Connection Status: 753985
Implantable Lead Connection Status: 753985
Implantable Lead Implant Date: 20230210
Implantable Lead Implant Date: 20230210
Implantable Lead Location: 753858
Implantable Lead Location: 753860
Implantable Pulse Generator Implant Date: 20230210
Lead Channel Impedance Value: 460 Ohm
Lead Channel Impedance Value: 480 Ohm
Lead Channel Pacing Threshold Amplitude: 0.5 V
Lead Channel Pacing Threshold Amplitude: 1.5 V
Lead Channel Pacing Threshold Pulse Width: 0.5 ms
Lead Channel Pacing Threshold Pulse Width: 0.5 ms
Lead Channel Sensing Intrinsic Amplitude: 12 mV
Lead Channel Setting Pacing Amplitude: 2.25 V
Lead Channel Setting Pacing Amplitude: 2.5 V
Lead Channel Setting Pacing Pulse Width: 0.5 ms
Lead Channel Setting Pacing Pulse Width: 0.5 ms
Lead Channel Setting Sensing Sensitivity: 4 mV
Pulse Gen Model: 3562
Pulse Gen Serial Number: 3992207

## 2023-02-24 DIAGNOSIS — M1009 Idiopathic gout, multiple sites: Secondary | ICD-10-CM | POA: Diagnosis not present

## 2023-02-24 DIAGNOSIS — H81393 Other peripheral vertigo, bilateral: Secondary | ICD-10-CM | POA: Diagnosis not present

## 2023-02-24 DIAGNOSIS — I10 Essential (primary) hypertension: Secondary | ICD-10-CM | POA: Diagnosis not present

## 2023-02-24 DIAGNOSIS — I480 Paroxysmal atrial fibrillation: Secondary | ICD-10-CM | POA: Diagnosis not present

## 2023-02-24 DIAGNOSIS — Z6824 Body mass index (BMI) 24.0-24.9, adult: Secondary | ICD-10-CM | POA: Diagnosis not present

## 2023-02-24 DIAGNOSIS — N1832 Chronic kidney disease, stage 3b: Secondary | ICD-10-CM | POA: Diagnosis not present

## 2023-02-24 DIAGNOSIS — I5032 Chronic diastolic (congestive) heart failure: Secondary | ICD-10-CM | POA: Diagnosis not present

## 2023-03-02 ENCOUNTER — Ambulatory Visit: Payer: Medicare PPO | Attending: Cardiology

## 2023-03-02 ENCOUNTER — Telehealth: Payer: Self-pay

## 2023-03-02 DIAGNOSIS — Z950 Presence of cardiac pacemaker: Secondary | ICD-10-CM

## 2023-03-02 DIAGNOSIS — I5042 Chronic combined systolic (congestive) and diastolic (congestive) heart failure: Secondary | ICD-10-CM

## 2023-03-02 NOTE — Progress Notes (Signed)
EPIC Encounter for ICM Monitoring  Patient Name: Kevin Collins is a 89 y.o. male Date: 03/02/2023 Primary Care Physican: Neale Burly, MD Primary Cardiologist: Hochrein Electrophysiologist: Marisa Sprinkles Pacing: 93%            10/07/2022 Weight: 156 lbs                                                             Attempted call to patient and unable to reach.   Transmission reviewed.    CorVue thoracic impedance trending close to baseline normal.   Prescribed:  Furosemide 20 mg take 1 tablet(s) (20 mg total) by mouth daily. Spironolactone 25 mg take 1 tablet daily   Labs: 01/10/2022 Creatinine 1.89, BUN 31, Potassium 4.9, Sodium 139, GFR 34 A complete set of results can be found in Results Review.   Recommendations: Unable to reach.      Follow-up plan: ICM clinic phone appointment on 04/06/2023.   91 day device clinic remote transmission 05/05/2023.     EP/Cardiology Office Visits:  Recall 04/24/2023 with Dr. Quentin Ore.  Recall 07/18/2023 with Dr Percival Spanish.     Copy of ICM check sent to Dr. Quentin Ore.    3 month ICM trend: 03/02/2023.    12-14 Month ICM trend:     Rosalene Billings, RN 03/02/2023 10:39 AM

## 2023-03-02 NOTE — Telephone Encounter (Signed)
Remote ICM transmission received.  Attempted call to patient regarding ICM remote transmission and no answer.  

## 2023-03-05 NOTE — Progress Notes (Signed)
Remote pacemaker transmission.   

## 2023-04-06 ENCOUNTER — Ambulatory Visit: Payer: Medicare PPO | Attending: Cardiology

## 2023-04-06 DIAGNOSIS — I5042 Chronic combined systolic (congestive) and diastolic (congestive) heart failure: Secondary | ICD-10-CM

## 2023-04-06 DIAGNOSIS — Z95810 Presence of automatic (implantable) cardiac defibrillator: Secondary | ICD-10-CM | POA: Diagnosis not present

## 2023-04-08 NOTE — Progress Notes (Signed)
EPIC Encounter for ICM Monitoring  Patient Name: Kevin Collins is a 89 y.o. male Date: 04/08/2023 Primary Care Physican: Toma Deiters, MD Primary Cardiologist: Hochrein Electrophysiologist: Townsend Roger Pacing: 93%            10/07/2022 Weight: 156 lbs                                                             Transmission reviewed.    CorVue thoracic impedance suggesting normal fluid levels.   Prescribed:  Furosemide 20 mg take 1 tablet(s) (20 mg total) by mouth daily. Spironolactone 25 mg take 1 tablet daily   Labs: 01/10/2022 Creatinine 1.89, BUN 31, Potassium 4.9, Sodium 139, GFR 34 A complete set of results can be found in Results Review.   Recommendations:  No changes.   Follow-up plan: ICM clinic phone appointment on 05/12/2023.   91 day device clinic remote transmission 05/05/2023.     EP/Cardiology Office Visits:  Recall 04/24/2023 with Dr. Lalla Brothers.  Recall 07/18/2023 with Dr Antoine Poche.     Copy of ICM check sent to Dr. Lalla Brothers.     3 month ICM trend: 04/06/2023.    12-14 Month ICM trend:     Karie Soda, RN 04/08/2023 9:08 AM

## 2023-05-05 ENCOUNTER — Ambulatory Visit (INDEPENDENT_AMBULATORY_CARE_PROVIDER_SITE_OTHER): Payer: Medicare PPO

## 2023-05-05 DIAGNOSIS — I4819 Other persistent atrial fibrillation: Secondary | ICD-10-CM

## 2023-05-05 DIAGNOSIS — I5042 Chronic combined systolic (congestive) and diastolic (congestive) heart failure: Secondary | ICD-10-CM | POA: Diagnosis not present

## 2023-05-05 LAB — CUP PACEART REMOTE DEVICE CHECK
Battery Remaining Longevity: 82 mo
Battery Remaining Percentage: 87 %
Battery Voltage: 3.01 V
Date Time Interrogation Session: 20240521040008
Implantable Lead Connection Status: 753985
Implantable Lead Connection Status: 753985
Implantable Lead Implant Date: 20230210
Implantable Lead Implant Date: 20230210
Implantable Lead Location: 753858
Implantable Lead Location: 753860
Implantable Pulse Generator Implant Date: 20230210
Lead Channel Impedance Value: 450 Ohm
Lead Channel Impedance Value: 460 Ohm
Lead Channel Pacing Threshold Amplitude: 0.5 V
Lead Channel Pacing Threshold Amplitude: 1.5 V
Lead Channel Pacing Threshold Pulse Width: 0.5 ms
Lead Channel Pacing Threshold Pulse Width: 0.5 ms
Lead Channel Sensing Intrinsic Amplitude: 11.7 mV
Lead Channel Setting Pacing Amplitude: 2.25 V
Lead Channel Setting Pacing Amplitude: 2.5 V
Lead Channel Setting Pacing Pulse Width: 0.5 ms
Lead Channel Setting Pacing Pulse Width: 0.5 ms
Lead Channel Setting Sensing Sensitivity: 4 mV
Pulse Gen Model: 3562
Pulse Gen Serial Number: 3992207

## 2023-05-12 ENCOUNTER — Ambulatory Visit: Payer: Medicare PPO | Attending: Cardiology

## 2023-05-12 DIAGNOSIS — Z95810 Presence of automatic (implantable) cardiac defibrillator: Secondary | ICD-10-CM

## 2023-05-12 DIAGNOSIS — I5042 Chronic combined systolic (congestive) and diastolic (congestive) heart failure: Secondary | ICD-10-CM

## 2023-05-14 NOTE — Progress Notes (Signed)
EPIC Encounter for ICM Monitoring  Patient Name: Kevin Collins is a 89 y.o. male Date: 05/14/2023 Primary Care Physican: Toma Deiters, MD Primary Cardiologist: Hochrein Electrophysiologist: Townsend Roger Pacing: 94%            05/14/2023 Weight: 155 lbs                                                             Spoke with patient and heart failure questions reviewed.  Transmission results reviewed.  Pt reports intermittent minimal lower extremity swelling but other wise without complaint. He has the start of Macular degeneration    CorVue thoracic impedance suggesting intermittent days with possible fluid accumulation.   Prescribed:  Furosemide 20 mg take 1 tablet(s) (20 mg total) by mouth daily. Spironolactone 25 mg take 1 tablet daily   Labs: 01/10/2022 Creatinine 1.89, BUN 31, Potassium 4.9, Sodium 139, GFR 34 A complete set of results can be found in Results Review.   Recommendations:  No changes and encouraged to call if experiencing any fluid symptoms.   Follow-up plan: ICM clinic phone appointment on 06/15/2023.   91 day device clinic remote transmission 08/04/2023.     EP/Cardiology Office Visits:   Provided EP scheduling number to make appt with Dr Lalla Brothers.   Recall 04/24/2023 with Dr. Lalla Brothers.  Recall 07/18/2023 with Dr Antoine Poche.     Copy of ICM check sent to Dr. Lalla Brothers.     3 month ICM trend: 05/12/2023.    12-14 Month ICM trend:     Karie Soda, RN 05/14/2023 12:50 PM

## 2023-05-26 DIAGNOSIS — H81393 Other peripheral vertigo, bilateral: Secondary | ICD-10-CM | POA: Diagnosis not present

## 2023-05-26 DIAGNOSIS — I5032 Chronic diastolic (congestive) heart failure: Secondary | ICD-10-CM | POA: Diagnosis not present

## 2023-05-26 DIAGNOSIS — N1832 Chronic kidney disease, stage 3b: Secondary | ICD-10-CM | POA: Diagnosis not present

## 2023-05-26 DIAGNOSIS — Z6823 Body mass index (BMI) 23.0-23.9, adult: Secondary | ICD-10-CM | POA: Diagnosis not present

## 2023-05-26 DIAGNOSIS — I10 Essential (primary) hypertension: Secondary | ICD-10-CM | POA: Diagnosis not present

## 2023-05-26 DIAGNOSIS — Z0000 Encounter for general adult medical examination without abnormal findings: Secondary | ICD-10-CM | POA: Diagnosis not present

## 2023-05-26 DIAGNOSIS — M1009 Idiopathic gout, multiple sites: Secondary | ICD-10-CM | POA: Diagnosis not present

## 2023-05-26 DIAGNOSIS — I480 Paroxysmal atrial fibrillation: Secondary | ICD-10-CM | POA: Diagnosis not present

## 2023-05-28 NOTE — Progress Notes (Signed)
Remote pacemaker transmission.   

## 2023-06-15 ENCOUNTER — Ambulatory Visit: Payer: Medicare PPO | Attending: Cardiology

## 2023-06-15 DIAGNOSIS — Z95810 Presence of automatic (implantable) cardiac defibrillator: Secondary | ICD-10-CM

## 2023-06-15 DIAGNOSIS — I5042 Chronic combined systolic (congestive) and diastolic (congestive) heart failure: Secondary | ICD-10-CM

## 2023-06-17 ENCOUNTER — Telehealth: Payer: Self-pay

## 2023-06-17 NOTE — Progress Notes (Signed)
EPIC Encounter for ICM Monitoring  Patient Name: Saheim Geary is a 89 y.o. male Date: 06/17/2023 Primary Care Physican: Toma Deiters, MD Primary Cardiologist: Hochrein Electrophysiologist: Townsend Roger Pacing: 94%            05/14/2023 Weight: 155 lbs                                                             Attempted call to patient and unable to reach.  Left detailed message per DPR regarding transmission. Transmission reviewed.    CorVue thoracic impedance suggesting normal fluid levels within the last month.   Prescribed:  Furosemide 20 mg take 1 tablet(s) (20 mg total) by mouth daily. Spironolactone 25 mg take 1 tablet daily   Labs: 01/10/2022 Creatinine 1.89, BUN 31, Potassium 4.9, Sodium 139, GFR 34 A complete set of results can be found in Results Review.   Recommendations:  Left voice mail with ICM number and encouraged to call if experiencing any fluid symptoms.   Follow-up plan: ICM clinic phone appointment on 07/20/2023.   91 day device clinic remote transmission 08/04/2023.     EP/Cardiology Office Visits:   Has EP scheduling number to make appt with Dr Lalla Brothers.   Recall 04/24/2023 with Dr. Lalla Brothers.  Recall 07/18/2023 with Dr Antoine Poche.     Copy of ICM check sent to Dr. Lalla Brothers.     3 month ICM trend: 06/15/2023.    12-14 Month ICM trend:     Karie Soda, RN 06/17/2023 2:01 PM

## 2023-06-17 NOTE — Telephone Encounter (Signed)
Remote ICM transmission received.  Attempted call to patient regarding ICM remote transmission and left detailed message per DPR.  Left ICM phone number and advised to return call for any fluid symptoms or questions. Next ICM remote transmission scheduled 07/20/2023.

## 2023-06-26 ENCOUNTER — Other Ambulatory Visit: Payer: Self-pay | Admitting: Cardiology

## 2023-07-20 ENCOUNTER — Ambulatory Visit: Payer: Medicare PPO | Attending: Cardiology

## 2023-07-20 DIAGNOSIS — I5042 Chronic combined systolic (congestive) and diastolic (congestive) heart failure: Secondary | ICD-10-CM | POA: Diagnosis not present

## 2023-07-20 DIAGNOSIS — Z95810 Presence of automatic (implantable) cardiac defibrillator: Secondary | ICD-10-CM | POA: Diagnosis not present

## 2023-07-22 NOTE — Progress Notes (Signed)
EPIC Encounter for ICM Monitoring  Patient Name: Kevin Collins is a 89 y.o. male Date: 07/22/2023 Primary Care Physican: Toma Deiters, MD Primary Cardiologist: Hochrein Electrophysiologist: Townsend Roger Pacing: 94%            05/14/2023 Weight: 155 lbs  07/22/2023 Weight: 154 lbs                                                            Spoke with patient and heart failure questions reviewed.  Transmission results reviewed.  Pt asymptomatic for fluid accumulation.  Reports feeling well at this time and voices no complaints.   He has macular degeneration and seeking eye doctor for treatment.  He will check with with his current eye physician for referral.    CorVue thoracic impedance suggesting normal fluid levels within the last month.   Prescribed:  Furosemide 20 mg take 1 tablet(s) (20 mg total) by mouth daily. Spironolactone 25 mg take 1 tablet daily   Labs: 01/10/2022 Creatinine 1.89, BUN 31, Potassium 4.9, Sodium 139, GFR 34 A complete set of results can be found in Results Review.   Recommendations:  No changes and encouraged to call if experiencing any fluid symptoms.   Follow-up plan: ICM clinic phone appointment on 08/24/2023.   91 day device clinic remote transmission 08/04/2023.     EP/Cardiology Office Visits:   Advised to call the offices of Dr Lalla Brothers and Dr Antoine Poche for appointments.   Recall 04/24/2023 with Dr. Lalla Brothers.  Recall 07/18/2023 with Dr Antoine Poche.     Copy of ICM check sent to Dr. Lalla Brothers.     3 month ICM trend: 07/20/2023.    12-14 Month ICM trend:     Karie Soda, RN 07/22/2023 10:13 AM

## 2023-08-02 DIAGNOSIS — Z5329 Procedure and treatment not carried out because of patient's decision for other reasons: Secondary | ICD-10-CM | POA: Diagnosis not present

## 2023-08-02 DIAGNOSIS — L03032 Cellulitis of left toe: Secondary | ICD-10-CM | POA: Diagnosis not present

## 2023-08-04 ENCOUNTER — Ambulatory Visit (INDEPENDENT_AMBULATORY_CARE_PROVIDER_SITE_OTHER): Payer: Medicare PPO

## 2023-08-04 DIAGNOSIS — I4819 Other persistent atrial fibrillation: Secondary | ICD-10-CM

## 2023-08-04 DIAGNOSIS — I5043 Acute on chronic combined systolic (congestive) and diastolic (congestive) heart failure: Secondary | ICD-10-CM

## 2023-08-04 LAB — CUP PACEART REMOTE DEVICE CHECK
Battery Remaining Longevity: 80 mo
Battery Remaining Percentage: 85 %
Battery Voltage: 3.01 V
Date Time Interrogation Session: 20240820040009
Implantable Lead Connection Status: 753985
Implantable Lead Connection Status: 753985
Implantable Lead Implant Date: 20230210
Implantable Lead Implant Date: 20230210
Implantable Lead Location: 753858
Implantable Lead Location: 753860
Implantable Pulse Generator Implant Date: 20230210
Lead Channel Impedance Value: 440 Ohm
Lead Channel Impedance Value: 480 Ohm
Lead Channel Pacing Threshold Amplitude: 0.5 V
Lead Channel Pacing Threshold Amplitude: 1.5 V
Lead Channel Pacing Threshold Pulse Width: 0.5 ms
Lead Channel Pacing Threshold Pulse Width: 0.5 ms
Lead Channel Sensing Intrinsic Amplitude: 11.1 mV
Lead Channel Setting Pacing Amplitude: 2.25 V
Lead Channel Setting Pacing Amplitude: 2.5 V
Lead Channel Setting Pacing Pulse Width: 0.5 ms
Lead Channel Setting Pacing Pulse Width: 0.5 ms
Lead Channel Setting Sensing Sensitivity: 4 mV
Pulse Gen Model: 3562
Pulse Gen Serial Number: 3992207

## 2023-08-06 DIAGNOSIS — M79672 Pain in left foot: Secondary | ICD-10-CM | POA: Diagnosis not present

## 2023-08-06 DIAGNOSIS — L03122 Acute lymphangitis of left axilla: Secondary | ICD-10-CM | POA: Diagnosis not present

## 2023-08-09 ENCOUNTER — Other Ambulatory Visit: Payer: Self-pay | Admitting: Cardiology

## 2023-08-11 ENCOUNTER — Other Ambulatory Visit: Payer: Self-pay | Admitting: Cardiology

## 2023-08-14 ENCOUNTER — Telehealth: Payer: Self-pay | Admitting: Cardiology

## 2023-08-14 NOTE — Telephone Encounter (Signed)
Pt's medication was already sent to pt's pharmacy. Confirmation received.  

## 2023-08-14 NOTE — Telephone Encounter (Signed)
*  STAT* If patient is at the pharmacy, call can be transferred to refill team.   1. Which medications need to be refilled? (please list name of each medication and dose if known) Ezetimibe 10 mg   2. Would you like to learn more about the convenience, safety, & potential cost savings by using the [facility]?     3. Are you open to using the [facility] ([facility].    4. Which pharmacy/location (including street and city if local pharmacy) is medication to be sent to?[facility]   5. Do they need a 30 day or 90 day supply? He received this medicine and it was for 30 days and it should be 90 days  and refills

## 2023-08-14 NOTE — Progress Notes (Signed)
Remote pacemaker transmission.   

## 2023-08-16 DIAGNOSIS — R7881 Bacteremia: Secondary | ICD-10-CM

## 2023-08-16 HISTORY — DX: Bacteremia: R78.81

## 2023-08-18 ENCOUNTER — Encounter (HOSPITAL_COMMUNITY): Payer: Self-pay

## 2023-08-18 ENCOUNTER — Emergency Department (HOSPITAL_COMMUNITY): Payer: Medicare PPO

## 2023-08-18 ENCOUNTER — Inpatient Hospital Stay (HOSPITAL_COMMUNITY)
Admission: EM | Admit: 2023-08-18 | Discharge: 2023-08-23 | DRG: 314 | Disposition: A | Discharge status: Home Health | Payer: Medicare PPO | Attending: Internal Medicine | Admitting: Internal Medicine

## 2023-08-18 ENCOUNTER — Other Ambulatory Visit: Payer: Self-pay

## 2023-08-18 DIAGNOSIS — R197 Diarrhea, unspecified: Secondary | ICD-10-CM | POA: Diagnosis present

## 2023-08-18 DIAGNOSIS — N179 Acute kidney failure, unspecified: Secondary | ICD-10-CM | POA: Diagnosis not present

## 2023-08-18 DIAGNOSIS — M869 Osteomyelitis, unspecified: Secondary | ICD-10-CM | POA: Diagnosis present

## 2023-08-18 DIAGNOSIS — I38 Endocarditis, valve unspecified: Secondary | ICD-10-CM | POA: Diagnosis not present

## 2023-08-18 DIAGNOSIS — T827XXA Infection and inflammatory reaction due to other cardiac and vascular devices, implants and grafts, initial encounter: Principal | ICD-10-CM | POA: Diagnosis present

## 2023-08-18 DIAGNOSIS — G629 Polyneuropathy, unspecified: Secondary | ICD-10-CM | POA: Diagnosis present

## 2023-08-18 DIAGNOSIS — R509 Fever, unspecified: Secondary | ICD-10-CM | POA: Diagnosis not present

## 2023-08-18 DIAGNOSIS — I2510 Atherosclerotic heart disease of native coronary artery without angina pectoris: Secondary | ICD-10-CM | POA: Diagnosis not present

## 2023-08-18 DIAGNOSIS — D649 Anemia, unspecified: Secondary | ICD-10-CM | POA: Diagnosis not present

## 2023-08-18 DIAGNOSIS — E871 Hypo-osmolality and hyponatremia: Secondary | ICD-10-CM | POA: Diagnosis not present

## 2023-08-18 DIAGNOSIS — I119 Hypertensive heart disease without heart failure: Secondary | ICD-10-CM | POA: Diagnosis not present

## 2023-08-18 DIAGNOSIS — D696 Thrombocytopenia, unspecified: Secondary | ICD-10-CM | POA: Diagnosis present

## 2023-08-18 DIAGNOSIS — Z950 Presence of cardiac pacemaker: Secondary | ICD-10-CM | POA: Diagnosis not present

## 2023-08-18 DIAGNOSIS — Z0389 Encounter for observation for other suspected diseases and conditions ruled out: Secondary | ICD-10-CM | POA: Diagnosis not present

## 2023-08-18 DIAGNOSIS — Z87891 Personal history of nicotine dependence: Secondary | ICD-10-CM

## 2023-08-18 DIAGNOSIS — Z8041 Family history of malignant neoplasm of ovary: Secondary | ICD-10-CM

## 2023-08-18 DIAGNOSIS — I480 Paroxysmal atrial fibrillation: Secondary | ICD-10-CM | POA: Diagnosis not present

## 2023-08-18 DIAGNOSIS — Y712 Prosthetic and other implants, materials and accessory cardiovascular devices associated with adverse incidents: Secondary | ICD-10-CM | POA: Diagnosis present

## 2023-08-18 DIAGNOSIS — A419 Sepsis, unspecified organism: Secondary | ICD-10-CM | POA: Diagnosis not present

## 2023-08-18 DIAGNOSIS — M19072 Primary osteoarthritis, left ankle and foot: Secondary | ICD-10-CM | POA: Diagnosis not present

## 2023-08-18 DIAGNOSIS — Z515 Encounter for palliative care: Secondary | ICD-10-CM | POA: Diagnosis not present

## 2023-08-18 DIAGNOSIS — I330 Acute and subacute infective endocarditis: Secondary | ICD-10-CM | POA: Diagnosis not present

## 2023-08-18 DIAGNOSIS — N1832 Chronic kidney disease, stage 3b: Secondary | ICD-10-CM | POA: Diagnosis present

## 2023-08-18 DIAGNOSIS — Z7901 Long term (current) use of anticoagulants: Secondary | ICD-10-CM | POA: Diagnosis not present

## 2023-08-18 DIAGNOSIS — M79672 Pain in left foot: Secondary | ICD-10-CM

## 2023-08-18 DIAGNOSIS — R531 Weakness: Secondary | ICD-10-CM | POA: Diagnosis not present

## 2023-08-18 DIAGNOSIS — H9190 Unspecified hearing loss, unspecified ear: Secondary | ICD-10-CM | POA: Diagnosis not present

## 2023-08-18 DIAGNOSIS — I129 Hypertensive chronic kidney disease with stage 1 through stage 4 chronic kidney disease, or unspecified chronic kidney disease: Secondary | ICD-10-CM | POA: Diagnosis present

## 2023-08-18 DIAGNOSIS — Z951 Presence of aortocoronary bypass graft: Secondary | ICD-10-CM

## 2023-08-18 DIAGNOSIS — R6520 Severe sepsis without septic shock: Secondary | ICD-10-CM | POA: Diagnosis present

## 2023-08-18 DIAGNOSIS — B952 Enterococcus as the cause of diseases classified elsewhere: Secondary | ICD-10-CM | POA: Diagnosis not present

## 2023-08-18 DIAGNOSIS — M7732 Calcaneal spur, left foot: Secondary | ICD-10-CM | POA: Diagnosis not present

## 2023-08-18 DIAGNOSIS — Z823 Family history of stroke: Secondary | ICD-10-CM

## 2023-08-18 DIAGNOSIS — D631 Anemia in chronic kidney disease: Secondary | ICD-10-CM | POA: Diagnosis present

## 2023-08-18 DIAGNOSIS — J9811 Atelectasis: Secondary | ICD-10-CM | POA: Diagnosis not present

## 2023-08-18 DIAGNOSIS — I4821 Permanent atrial fibrillation: Secondary | ICD-10-CM | POA: Diagnosis present

## 2023-08-18 DIAGNOSIS — A4181 Sepsis due to Enterococcus: Secondary | ICD-10-CM | POA: Diagnosis present

## 2023-08-18 DIAGNOSIS — E785 Hyperlipidemia, unspecified: Secondary | ICD-10-CM | POA: Diagnosis present

## 2023-08-18 DIAGNOSIS — I517 Cardiomegaly: Secondary | ICD-10-CM | POA: Diagnosis not present

## 2023-08-18 DIAGNOSIS — I4510 Unspecified right bundle-branch block: Secondary | ICD-10-CM | POA: Diagnosis present

## 2023-08-18 DIAGNOSIS — L97529 Non-pressure chronic ulcer of other part of left foot with unspecified severity: Secondary | ICD-10-CM | POA: Diagnosis present

## 2023-08-18 DIAGNOSIS — R918 Other nonspecific abnormal finding of lung field: Secondary | ICD-10-CM | POA: Diagnosis not present

## 2023-08-18 DIAGNOSIS — I088 Other rheumatic multiple valve diseases: Secondary | ICD-10-CM | POA: Diagnosis not present

## 2023-08-18 DIAGNOSIS — I10 Essential (primary) hypertension: Secondary | ICD-10-CM | POA: Diagnosis not present

## 2023-08-18 DIAGNOSIS — G609 Hereditary and idiopathic neuropathy, unspecified: Secondary | ICD-10-CM | POA: Diagnosis not present

## 2023-08-18 DIAGNOSIS — Z1152 Encounter for screening for COVID-19: Secondary | ICD-10-CM

## 2023-08-18 DIAGNOSIS — Z8619 Personal history of other infectious and parasitic diseases: Secondary | ICD-10-CM

## 2023-08-18 DIAGNOSIS — Z66 Do not resuscitate: Secondary | ICD-10-CM | POA: Diagnosis present

## 2023-08-18 DIAGNOSIS — R7881 Bacteremia: Secondary | ICD-10-CM | POA: Diagnosis not present

## 2023-08-18 DIAGNOSIS — Z79899 Other long term (current) drug therapy: Secondary | ICD-10-CM

## 2023-08-18 DIAGNOSIS — L03116 Cellulitis of left lower limb: Secondary | ICD-10-CM | POA: Diagnosis present

## 2023-08-18 DIAGNOSIS — Z7189 Other specified counseling: Secondary | ICD-10-CM | POA: Diagnosis not present

## 2023-08-18 DIAGNOSIS — I700 Atherosclerosis of aorta: Secondary | ICD-10-CM | POA: Diagnosis not present

## 2023-08-18 DIAGNOSIS — I959 Hypotension, unspecified: Secondary | ICD-10-CM | POA: Diagnosis not present

## 2023-08-18 DIAGNOSIS — Z452 Encounter for adjustment and management of vascular access device: Secondary | ICD-10-CM | POA: Diagnosis not present

## 2023-08-18 LAB — URINALYSIS, W/ REFLEX TO CULTURE (INFECTION SUSPECTED)
Bilirubin Urine: NEGATIVE
Glucose, UA: NEGATIVE mg/dL
Hgb urine dipstick: NEGATIVE
Ketones, ur: 5 mg/dL — AB
Leukocytes,Ua: NEGATIVE
Nitrite: NEGATIVE
Protein, ur: 30 mg/dL — AB
Specific Gravity, Urine: 1.018 (ref 1.005–1.030)
pH: 5 (ref 5.0–8.0)

## 2023-08-18 LAB — CBC WITH DIFFERENTIAL/PLATELET
Abs Immature Granulocytes: 0.09 10*3/uL — ABNORMAL HIGH (ref 0.00–0.07)
Basophils Absolute: 0 10*3/uL (ref 0.0–0.1)
Basophils Relative: 0 %
Eosinophils Absolute: 0 10*3/uL (ref 0.0–0.5)
Eosinophils Relative: 0 %
HCT: 28.4 % — ABNORMAL LOW (ref 39.0–52.0)
Hemoglobin: 9.2 g/dL — ABNORMAL LOW (ref 13.0–17.0)
Immature Granulocytes: 1 %
Lymphocytes Relative: 4 %
Lymphs Abs: 0.6 10*3/uL — ABNORMAL LOW (ref 0.7–4.0)
MCH: 30.4 pg (ref 26.0–34.0)
MCHC: 32.4 g/dL (ref 30.0–36.0)
MCV: 93.7 fL (ref 80.0–100.0)
Monocytes Absolute: 1.4 10*3/uL — ABNORMAL HIGH (ref 0.1–1.0)
Monocytes Relative: 10 %
Neutro Abs: 11.5 10*3/uL — ABNORMAL HIGH (ref 1.7–7.7)
Neutrophils Relative %: 85 %
Platelets: 148 10*3/uL — ABNORMAL LOW (ref 150–400)
RBC: 3.03 MIL/uL — ABNORMAL LOW (ref 4.22–5.81)
RDW: 12.5 % (ref 11.5–15.5)
WBC: 13.6 10*3/uL — ABNORMAL HIGH (ref 4.0–10.5)
nRBC: 0 % (ref 0.0–0.2)

## 2023-08-18 LAB — RESP PANEL BY RT-PCR (RSV, FLU A&B, COVID)  RVPGX2
Influenza A by PCR: NEGATIVE
Influenza B by PCR: NEGATIVE
Resp Syncytial Virus by PCR: NEGATIVE
SARS Coronavirus 2 by RT PCR: NEGATIVE

## 2023-08-18 LAB — COMPREHENSIVE METABOLIC PANEL
ALT: 16 U/L (ref 0–44)
AST: 15 U/L (ref 15–41)
Albumin: 3.4 g/dL — ABNORMAL LOW (ref 3.5–5.0)
Alkaline Phosphatase: 46 U/L (ref 38–126)
Anion gap: 9 (ref 5–15)
BUN: 36 mg/dL — ABNORMAL HIGH (ref 8–23)
CO2: 23 mmol/L (ref 22–32)
Calcium: 7.3 mg/dL — ABNORMAL LOW (ref 8.9–10.3)
Chloride: 98 mmol/L (ref 98–111)
Creatinine, Ser: 1.95 mg/dL — ABNORMAL HIGH (ref 0.61–1.24)
GFR, Estimated: 32 mL/min — ABNORMAL LOW (ref >60)
Glucose, Bld: 108 mg/dL — ABNORMAL HIGH (ref 70–99)
Potassium: 4.4 mmol/L (ref 3.5–5.1)
Sodium: 130 mmol/L — ABNORMAL LOW (ref 135–145)
Total Bilirubin: 0.9 mg/dL (ref 0.3–1.2)
Total Protein: 6.3 g/dL — ABNORMAL LOW (ref 6.5–8.1)

## 2023-08-18 LAB — APTT: aPTT: 36 s (ref 24–36)

## 2023-08-18 LAB — LACTIC ACID, PLASMA: Lactic Acid, Venous: 0.9 mmol/L (ref 0.5–1.9)

## 2023-08-18 LAB — PROTIME-INR
INR: 1.4 — ABNORMAL HIGH (ref 0.8–1.2)
Prothrombin Time: 17.8 s — ABNORMAL HIGH (ref 11.4–15.2)

## 2023-08-18 MED ORDER — MAGNESIUM HYDROXIDE 400 MG/5ML PO SUSP: 30.0000 mL | Freq: Every day | ORAL | Status: DC | PRN

## 2023-08-18 MED ORDER — TRAZODONE HCL 50 MG PO TABS: 25.0000 mg | ORAL_TABLET | Freq: Every evening | ORAL | Status: DC | PRN

## 2023-08-18 MED ORDER — VANCOMYCIN HCL IN DEXTROSE 1-5 GM/200ML-% IV SOLN: 1000.0000 mg | Freq: Once | INTRAVENOUS | Status: DC

## 2023-08-18 MED ORDER — GABAPENTIN 400 MG PO CAPS
800.0000 mg | ORAL_CAPSULE | Freq: Three times a day (TID) | ORAL | Status: DC
  Administered 2023-08-19 (×2): 800 mg via ORAL
  Filled 2023-08-18 (×2): qty 8

## 2023-08-18 MED ORDER — CINACALCET HCL 30 MG PO TABS
90.0000 mg | ORAL_TABLET | Freq: Every day | ORAL | Status: DC
  Administered 2023-08-19 – 2023-08-23 (×5): 90 mg via ORAL
  Filled 2023-08-18 (×6): qty 3

## 2023-08-18 MED ORDER — PNEUMOCOCCAL 20-VAL CONJ VACC 0.5 ML IM SUSY
0.5000 mL | PREFILLED_SYRINGE | INTRAMUSCULAR | Status: DC
  Filled 2023-08-18: qty 0.5

## 2023-08-18 MED ORDER — ATORVASTATIN CALCIUM 40 MG PO TABS
40.0000 mg | ORAL_TABLET | Freq: Every day | ORAL | Status: DC
  Administered 2023-08-19 – 2023-08-23 (×5): 40 mg via ORAL
  Filled 2023-08-18 (×5): qty 1

## 2023-08-18 MED ORDER — APIXABAN 2.5 MG PO TABS
2.5000 mg | ORAL_TABLET | Freq: Two times a day (BID) | ORAL | Status: DC
  Administered 2023-08-19 (×2): 2.5 mg via ORAL
  Filled 2023-08-18 (×2): qty 1

## 2023-08-18 MED ORDER — OXYCODONE-ACETAMINOPHEN 7.5-325 MG PO TABS: 1.0000 | ORAL_TABLET | Freq: Three times a day (TID) | ORAL | Status: DC

## 2023-08-18 MED ORDER — METRONIDAZOLE 500 MG/100ML IV SOLN
500.0000 mg | Freq: Two times a day (BID) | INTRAVENOUS | Status: DC
  Administered 2023-08-19 (×2): 500 mg via INTRAVENOUS
  Filled 2023-08-18 (×2): qty 100

## 2023-08-18 MED ORDER — LACTATED RINGERS IV SOLN
150.0000 mL/h | INTRAVENOUS | Status: DC
  Administered 2023-08-19: 150 mL/h via INTRAVENOUS

## 2023-08-18 MED ORDER — LACTATED RINGERS IV BOLUS (SEPSIS)
1000.0000 mL | Freq: Once | INTRAVENOUS | Status: AC
  Administered 2023-08-19: 1000 mL via INTRAVENOUS

## 2023-08-18 MED ORDER — SODIUM CHLORIDE 0.9 % IV SOLN
1.0000 g | Freq: Once | INTRAVENOUS | Status: AC
  Administered 2023-08-18: 1 g via INTRAVENOUS
  Filled 2023-08-18: qty 10

## 2023-08-18 MED ORDER — PROSIGHT PO TABS
1.0000 | ORAL_TABLET | Freq: Two times a day (BID) | ORAL | Status: DC
  Filled 2023-08-18 (×6): qty 1

## 2023-08-18 MED ORDER — ONDANSETRON HCL 4 MG PO TABS
4.0000 mg | ORAL_TABLET | Freq: Four times a day (QID) | ORAL | Status: DC | PRN
  Administered 2023-08-20: 4 mg via ORAL
  Filled 2023-08-18: qty 1

## 2023-08-18 MED ORDER — EZETIMIBE 10 MG PO TABS
10.0000 mg | ORAL_TABLET | Freq: Every day | ORAL | Status: DC
  Administered 2023-08-19 – 2023-08-23 (×5): 10 mg via ORAL
  Filled 2023-08-18 (×5): qty 1

## 2023-08-18 MED ORDER — SODIUM CHLORIDE 0.9 % IV SOLN
2.0000 g | Freq: Once | INTRAVENOUS | Status: AC
  Administered 2023-08-19: 2 g via INTRAVENOUS
  Filled 2023-08-18: qty 12.5

## 2023-08-18 MED ORDER — OXYCODONE-ACETAMINOPHEN 5-325 MG PO TABS
1.0000 | ORAL_TABLET | Freq: Three times a day (TID) | ORAL | Status: DC
  Administered 2023-08-19 – 2023-08-23 (×11): 1 via ORAL
  Filled 2023-08-18 (×14): qty 1

## 2023-08-18 MED ORDER — ENOXAPARIN SODIUM 40 MG/0.4ML IJ SOSY: 40.0000 mg | PREFILLED_SYRINGE | INTRAMUSCULAR | Status: DC

## 2023-08-18 MED ORDER — LATANOPROST 0.005 % OP SOLN
1.0000 [drp] | Freq: Every day | OPHTHALMIC | Status: DC
  Administered 2023-08-19 – 2023-08-22 (×5): 1 [drp] via OPHTHALMIC
  Filled 2023-08-18 (×2): qty 2.5

## 2023-08-18 MED ORDER — ONDANSETRON HCL 4 MG/2ML IJ SOLN
4.0000 mg | Freq: Four times a day (QID) | INTRAMUSCULAR | Status: DC | PRN
  Administered 2023-08-21: 4 mg via INTRAVENOUS
  Filled 2023-08-18 (×2): qty 2

## 2023-08-18 MED ORDER — ACETAMINOPHEN 325 MG PO TABS: 650.0000 mg | ORAL_TABLET | Freq: Four times a day (QID) | ORAL | Status: DC | PRN

## 2023-08-18 MED ORDER — PRESERVISION AREDS PO CAPS: 1.0000 | ORAL_CAPSULE | Freq: Two times a day (BID) | ORAL | Status: DC

## 2023-08-18 MED ORDER — OXYCODONE HCL 5 MG PO TABS
2.5000 mg | ORAL_TABLET | Freq: Three times a day (TID) | ORAL | Status: DC
  Administered 2023-08-19 – 2023-08-23 (×10): 2.5 mg via ORAL
  Filled 2023-08-18 (×12): qty 1

## 2023-08-18 MED ORDER — LACTATED RINGERS IV BOLUS (SEPSIS)
250.0000 mL | Freq: Once | INTRAVENOUS | Status: AC
  Administered 2023-08-19: 250 mL via INTRAVENOUS

## 2023-08-18 MED ORDER — ACETAMINOPHEN 650 MG RE SUPP: 650.0000 mg | Freq: Four times a day (QID) | RECTAL | Status: DC | PRN

## 2023-08-18 MED ORDER — KRILL OIL 1000 MG PO CAPS: ORAL_CAPSULE | Freq: Every day | ORAL | Status: DC

## 2023-08-18 MED ORDER — VANCOMYCIN HCL 1500 MG/300ML IV SOLN
1500.0000 mg | Freq: Once | INTRAVENOUS | Status: AC
  Administered 2023-08-19: 1500 mg via INTRAVENOUS
  Filled 2023-08-18: qty 300

## 2023-08-18 NOTE — Progress Notes (Signed)
PHARMACIST - PHYSICIAN ORDER COMMUNICATION  CONCERNING: P&T Medication Policy on Herbal Medications  DESCRIPTION:  This patient's order for:  krill oil  has been noted.  This product(s) is classified as an "herbal" or natural product. Due to a lack of definitive safety studies or FDA approval, nonstandard manufacturing practices, plus the potential risk of unknown drug-drug interactions while on inpatient medications, the Pharmacy and Therapeutics Committee does not permit the use of "herbal" or natural products of this type within [facility].   ACTION TAKEN: The pharmacy department is unable to verify this order at this time and your patient has been informed of this safety policy. Please reevaluate patient's clinical condition at discharge and address if the herbal or natural product(s) should be resumed at that time.   Arley Phenix RPh 08/18/2023, 11:35 PM

## 2023-08-18 NOTE — H&P (Signed)
PATIENT NAME: Kevin Collins    MR#:  [id number]  DATE OF BIRTH:  Kevin Collins 28, 1931  DATE OF ADMISSION:  08/18/2023  PRIMARY CARE PHYSICIAN: Toma Deiters, MD   Patient is coming from: Home  REQUESTING/REFERRING PHYSICIAN: Anders Simmonds, MD  CHIEF COMPLAINT:   Chief Complaint  Patient presents with   Fever    HISTORY OF PRESENT ILLNESS:  Kevin Collins is a 89 y.o., Caucasian male with medical history significant for coronary artery disease, hypertension, dyslipidemia and peripheral neuropathy, presented to the emergency room with acute onset of fever and chills since Monday p.m.  On Tuesday he experienced bilateral lower extremity weakness he could not walk.  He has underlying peripheral neuropathy as a [place] veteran.  After couple of hours he was able to ambulate but at 4 PM is experienced chills again and was feeling warm.  Again he was unable to ambulate when he came to the ER.  He denied any cough or wheezing or dyspnea.  No dysuria, oliguria or hematuria, urinary frequency or urgency or flank pain.  No chest pain or palpitations.  No nausea or vomiting or diarrhea.  The patient was recently treated for left foot cellulitis due to infected blister a couple weeks ago and just finished a course of antibiotic therapy yesterday.  No worsening pain or swelling or erythema or tenderness or discharge.  ED Course: When he came to the ER, his temperature was 103.2 and after Tylenol 99 with otherwise unremarkable vital signs.  Labs revealed yponatremia of 130 and a BN of 36 with creatinine 1.95 compared to 31 and 1.89 on 01/10/2022.  CBC showed leukocytosis of 13.6 with neutrophilia and mild thrombocytopenia as well as anemia.  LFTs showed albumin 3.4 and total protein 6.3 PT was 17.8 and INR 1.4 with PTT of 36.  Influenza, RSV and COVID-19 PCR's came back negative.  UA showed rare bacteria and 30 protein and was otherwise unremarkable.  Blood cultures were drawn.  EKG as reviewed  by me : Showed junctional rhythm with a rate of 60 and right bundle branch block. Imaging: Portable chest x-ray showed no acute cardiopulmonary disease.  The patient was given a gram of IV Rocephin.  He will be admitted to a medical telemetry bed for further evaluation and management. PAST MEDICAL HISTORY:   Past Medical History:  Diagnosis Date   Bradycardia, unspecified    CAD (coronary artery disease)    NATIVE WITHOUT ANGINA   Hereditary and idiopathic neuropathy, unspecified    History of shingles    Hyperlipidemia    Hypertension    ESSENTIAL PRIMARY   Peripheral neuropathy     PAST SURGICAL HISTORY:   Past Surgical History:  Procedure Laterality Date   BIV PACEMAKER INSERTION CRT-P N/A 01/24/2022   Procedure: BIV PACEMAKER INSERTION CRT-P;  Surgeon: Lanier Prude, MD;  Location: [facility] INVASIVE CV LAB;  Service: Cardiovascular;  Laterality: N/A;   CARDIAC CATHETERIZATION Left 09/1999   NORMAL LEFT MAIN, OCCLUDED LAD, OCCLUDED MID CFX, OCCLUDED PROXIMAL RCA, 60% STENOSIS PROXIMAL DIAG 1, RIGHT TO LEFT COLLATERAL, LEFT TO RIGHT COLLATERAL; LVEF OF 48% DOCUMENTED VIA NUCLEAR STUDY ON 07/12/2009.   CARDIOVERSION N/A 10/05/2019   Procedure: CARDIOVERSION;  Surgeon: Thurmon Fair, MD;  Location: [facility] ENDOSCOPY;  Service: Cardiovascular;  Laterality: N/A;   CATARACT EXTRACTION     CORONARY ARTERY BYPASS GRAFT     w/ LIMA TO LAD, SVG TO dx, SVG TO OM, SVG TO AM-PD-PL  10/12/99 [facility]   REMOVAL OF ANKLE PLATE      SOCIAL HISTORY:   Social History   Tobacco Use   Smoking status: Former   Smokeless tobacco: Never  Substance Use Topics   Alcohol use: Never    FAMILY HISTORY:   Family History  Problem Relation Age of Onset   Other Mother        MALIGNANT TUMOR OF OVARY   CVA Father     DRUG ALLERGIES:  No Known Allergies  REVIEW OF SYSTEMS:   ROS As per history of present illness. All pertinent systems were reviewed above. Constitutional, HEENT,  cardiovascular, respiratory, GI, GU, musculoskeletal, neuro, psychiatric, endocrine, integumentary and hematologic systems were reviewed and are otherwise negative/unremarkable except for positive findings mentioned above in the HPI.   MEDICATIONS AT HOME:   Prior to Admission medications   Medication Sig Start Date End Date Taking? Authorizing Provider  bimatoprost (LUMIGAN) 0.01 % SOLN Place 1 drop into both eyes at bedtime.   Yes [provider]  cinacalcet (SENSIPAR) 90 MG tablet Take 90 mg by mouth daily. 10/31/20  Yes [provider]  ELIQUIS 2.5 MG TABS tablet TAKE 1 TABLET TWICE A DAY 12/02/22  Yes Hochrein, Fayrene Fearing, MD  ezetimibe (ZETIA) 10 MG tablet TAKE 1 TABLET DAILY (NEED APPOINTMENT FOR FUTURE REFILLS) 08/10/23  Yes Rollene Rotunda, MD  furosemide (LASIX) 20 MG tablet Take 1 tablet (20 mg total) by mouth daily. 07/23/22  Yes Rollene Rotunda, MD  gabapentin (NEURONTIN) 800 MG tablet Take 800 mg by mouth 3 (three) times daily.   Yes [provider]  KRILL OIL PO Take 1 capsule by mouth daily.   Yes [provider]  lisinopril (ZESTRIL) 20 MG tablet TAKE 1 TABLET DAILY (NEED APPOINTMENT FOR REFILLS) 05/26/22  Yes Rollene Rotunda, MD  Multiple Vitamins-Minerals (PRESERVISION AREDS) CAPS Take 1 capsule by mouth 2 (two) times daily.   Yes [provider]  oxyCODONE-acetaminophen (PERCOCET) 7.5-325 MG tablet Take 1 tablet by mouth 3 (three) times daily.   Yes [provider]  simvastatin (ZOCOR) 80 MG tablet TAKE 1 TABLET DAILY 08/11/23  Yes Rollene Rotunda, MD  spironolactone (ALDACTONE) 25 MG tablet TAKE 1 TABLET DAILY 06/26/23  Yes Rollene Rotunda, MD      VITAL SIGNS:  Blood pressure 114/63, pulse 66, temperature 98.9 F (37.2 C), temperature source Oral, resp. rate 18, weight 72.8 kg, SpO2 100%.  PHYSICAL EXAMINATION:  Physical Exam  GENERAL:  89 y.o.-year-old Caucasian male patient lying in the bed with no acute distress.  EYES:  Pupils equal, round, reactive to light and accommodation. No scleral icterus. Extraocular muscles intact.  HEENT: Head atraumatic, normocephalic. Oropharynx and nasopharynx clear.  NECK:  Supple, no jugular venous distention. No thyroid enlargement, no tenderness.  LUNGS: Normal breath sounds bilaterally, no wheezing, rales,rhonchi or crepitation. No use of accessory muscles of respiration.  CARDIOVASCULAR: Regular rate and rhythm, S1, S2 normal. No murmurs, rubs, or gallops.  ABDOMEN: Soft, nondistended, nontender. Bowel sounds present. No organomegaly or mass.  EXTREMITIES: No pedal edema, cyanosis, or clubbing.  NEUROLOGIC: Cranial nerves II through XII are intact. Muscle strength 5/5 in all extremities. Sensation intact. Gait not checked.  PSYCHIATRIC: The patient is alert and oriented x 3.  Normal affect and good eye contact. SKIN: Left foot healing plantar ulcer with no erythema or tenderness or discharge.      LABORATORY PANEL:   CBC Recent Labs  Lab 08/18/23 1858  WBC 13.6*  HGB 9.2*  HCT 28.4*  PLT 148*   ------------------------------------------------------------------------------------------------------------------  Chemistries  Recent Labs  Lab 08/18/23 1858  NA 130*  K 4.4  CL 98  CO2 23  GLUCOSE 108*  BUN 36*  CREATININE 1.95*  CALCIUM 7.3*  AST 15  ALT 16  ALKPHOS 46  BILITOT 0.9   ------------------------------------------------------------------------------------------------------------------  Cardiac Enzymes No results for input(s): "TROPONINI" in the last 168 hours. ------------------------------------------------------------------------------------------------------------------  RADIOLOGY:  DG Chest Port 1 View  Result Date: 08/18/2023 CLINICAL DATA:  Questionable sepsis - evaluate for abnormality. 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 Prior CABG. Heart and  mediastinal contours within normal limits. Aortic atherosclerosis. No confluent opacities or effusions. No acute bony abnormality. IMPRESSION: No active disease. Electronically Signed   By: Charlett Nose M.D.   On: 08/18/2023 21:23      IMPRESSION AND PLAN:  Assessment and Plan: * Sepsis due to undetermined organism [facility]) - The patient has significant fever, rule out sepsis. - He will be admitted to a medical telemetry bed. - We will continue hydration with IV normal saline. - We will follow blood cultures. - We will continue broad-spectrum antibiotic therapy with IV vancomycin, cefepime and Flagyl.   Paroxysmal atrial fibrillation (HCC) - Continue Eliquis  Dyslipidemia - We will continue statin therapy as well as Zetia.  Peripheral neuropathy - Will resume home Neurontin.   DVT prophylaxis: Lovenox. Advanced Care Planning:  Code Status: The patient is DNR and DNI.  This was discussed with him. Family Communication:  The plan of care was discussed in details with the patient (and family). I answered all questions. The patient agreed to proceed with the above mentioned plan. Further management will depend upon hospital course. Disposition Plan: Back to previous home environment Consults called: none. All the records are reviewed and case discussed with ED provider.  Status is: Inpatient  At the time of the admission, it appears that the appropriate admission status for this patient is inpatient.  This is judged to be reasonable and necessary in order to provide the required intensity of service to ensure the patient's safety given the presenting symptoms, physical exam findings and initial radiographic and laboratory data in the context of comorbid conditions.  The patient requires inpatient status due to high intensity of service, high risk of further deterioration and high frequency of surveillance required.  I certify that at the time of admission, it is my clinical judgment that  the patient will require inpatient hospital care extending more than 2 midnights.                            Dispo: The patient is from: Home              Anticipated d/c is to: Home              Patient currently is not medically stable to d/c.              Difficult to place patient: No  Kevin Collins M.D on 08/19/2023 at 2:11 AM  [facility]   From 7 PM-7 AM, contact night-coverage [email or web address]  CC: Primary care physician; Toma Deiters, MD

## 2023-08-18 NOTE — ED Notes (Signed)
Report given.

## 2023-08-18 NOTE — ED Provider Notes (Signed)
[place] EMERGENCY DEPARTMENT AT [facility] Provider Note   CSN: [id number] Arrival date & time: 08/18/23  1836     History  Chief Complaint  Patient presents with   Fever    Kevin Collins is a 89 y.o. male.  This is a 89 year old male who presents emergency department today due to fever and weakness.  Per the patient, over the last 1 day, he has been feeling weak, had a difficult time getting up to stand due to generalized weakness.  Patient's son called EMS when he seen he thought the patient seem more confused than he usually was.  Upon arrival to the emergency department, patient was febrile.  Was alert, oriented x 3.  He denies any fever, cough.  Just says that he is felt very weak over the last day.   Fever      Home Medications Prior to Admission medications   Medication Sig Start Date End Date Taking? Authorizing Provider  bimatoprost (LUMIGAN) 0.01 % SOLN Place 1 drop into both eyes at bedtime.   Yes [provider]  cinacalcet (SENSIPAR) 90 MG tablet Take 90 mg by mouth daily. 10/31/20  Yes [provider]  ELIQUIS 2.5 MG TABS tablet TAKE 1 TABLET TWICE A DAY 12/02/22  Yes Hochrein, Fayrene Fearing, MD  ezetimibe (ZETIA) 10 MG tablet TAKE 1 TABLET DAILY (NEED APPOINTMENT FOR FUTURE REFILLS) 08/10/23  Yes Rollene Rotunda, MD  furosemide (LASIX) 20 MG tablet Take 1 tablet (20 mg total) by mouth daily. 07/23/22  Yes Rollene Rotunda, MD  gabapentin (NEURONTIN) 800 MG tablet Take 800 mg by mouth 3 (three) times daily.   Yes [provider]  KRILL OIL PO Take 1 capsule by mouth daily.   Yes [provider]  lisinopril (ZESTRIL) 20 MG tablet TAKE 1 TABLET DAILY (NEED APPOINTMENT FOR REFILLS) 05/26/22  Yes Rollene Rotunda, MD  Multiple Vitamins-Minerals (PRESERVISION AREDS) CAPS Take 1 capsule by mouth 2 (two) times daily.   Yes [provider]  oxyCODONE-acetaminophen (PERCOCET) 7.5-325 MG tablet Take 1 tablet by mouth 3 (three) times  daily.   Yes [provider]  simvastatin (ZOCOR) 80 MG tablet TAKE 1 TABLET DAILY 08/11/23  Yes Rollene Rotunda, MD  spironolactone (ALDACTONE) 25 MG tablet TAKE 1 TABLET DAILY 06/26/23  Yes Rollene Rotunda, MD      Allergies    Patient has no known allergies.    Review of Systems   Review of Systems  Constitutional:  Positive for fever.    Physical Exam Updated Vital Signs BP (!) 106/59   Pulse 63   Temp 99.9 F (37.7 C)   Resp 14   Wt 73 kg   SpO2 98%   BMI 23.77 kg/m  Physical Exam Vitals reviewed.  Constitutional:      Appearance: He is ill-appearing.  HENT:     Head: Normocephalic and atraumatic.  Cardiovascular:     Rate and Rhythm: Normal rate.  Pulmonary:     Effort: Pulmonary effort is normal.     Breath sounds: Normal breath sounds.  Neurological:     General: No focal deficit present.     Mental Status: He is alert.     Comments: 4-5 strength bilaterally in his lower extremities with plan dorsi flexion.  Able to straight leg off of the bed without assistance with both legs.     ED Results / Procedures / Treatments   Labs (all labs ordered are listed, but only abnormal results are displayed) Labs  Reviewed  COMPREHENSIVE METABOLIC PANEL - Abnormal; Notable for the following components:      Result Value   Sodium 130 (*)    Glucose, Bld 108 (*)    BUN 36 (*)    Creatinine, Ser 1.95 (*)    Calcium 7.3 (*)    Total Protein 6.3 (*)    Albumin 3.4 (*)    GFR, Estimated 32 (*)    All other components within normal limits  CBC WITH DIFFERENTIAL/PLATELET - Abnormal; Notable for the following components:   WBC 13.6 (*)    RBC 3.03 (*)    Hemoglobin 9.2 (*)    HCT 28.4 (*)    Platelets 148 (*)    Neutro Abs 11.5 (*)    Lymphs Abs 0.6 (*)    Monocytes Absolute 1.4 (*)    Abs Immature Granulocytes 0.09 (*)    All other components within normal limits  PROTIME-INR - Abnormal; Notable for the following components:   Prothrombin Time 17.8 (*)     INR 1.4 (*)    All other components within normal limits  URINALYSIS, W/ REFLEX TO CULTURE (INFECTION SUSPECTED) - Abnormal; Notable for the following components:   Ketones, ur 5 (*)    Protein, ur 30 (*)    Bacteria, UA RARE (*)    All other components within normal limits  CULTURE, BLOOD (ROUTINE X 2)  CULTURE, BLOOD (ROUTINE X 2)  RESP PANEL BY RT-PCR (RSV, FLU A&B, COVID)  RVPGX2  LACTIC ACID, PLASMA  APTT    EKG EKG Interpretation Date/Time:  Tuesday August 18 2023 18:50:42 EDT Ventricular Rate:  60 PR Interval:    QRS Duration:  152 QT Interval:  432 QTC Calculation: 432 R Axis:   151  Text Interpretation: Junctional rhythm Right bundle branch block Confirmed by Anders Simmonds [phone]) on 08/18/2023 7:46:43 PM  Radiology DG Chest Port 1 View  Result Date: 08/18/2023 CLINICAL DATA:  Questionable sepsis - evaluate for abnormality. 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 Prior CABG. Heart and mediastinal contours within normal limits. Aortic atherosclerosis. No confluent opacities or effusions. No acute bony abnormality. IMPRESSION: No active disease. Electronically Signed   By: Charlett Nose M.D.   On: 08/18/2023 21:23    Procedures Procedures    Medications Ordered in ED Medications  cefTRIAXone (ROCEPHIN) 1 g in sodium chloride 0.9 % 100 mL IVPB (0 g Intravenous Stopped 08/18/23 2149)    ED Course/ Medical Decision Making/ A&P                                 Medical Decision Making This is a 89 year old male who is here today with fever and generalized weakness.  Differential diagnoses include sepsis, viral syndrome.  Plan-will look for source of infection in this patient.  Generalized weakness, we are seeing high volumes of viral syndrome in our community.  Labs ordered, blood cultures ordered.  Will give the patient first dose of antibiotics.  Out of concern for fluid overload, will  provide patient with 1 L of IV fluids.  Patient without any focal neurological deficits on my exam.  Reassessment-this patient may have sepsis.  Was not able to identify a source.  Will admit patient to hospitalist.  Amount and/or Complexity of Data Reviewed Labs: ordered. Radiology: ordered. ECG/medicine tests: ordered.  Risk Decision regarding hospitalization.  Final Clinical Impression(s) / ED Diagnoses Final diagnoses:  Fever of unknown origin    Rx / DC Orders ED Discharge Orders     None         Arletha Pili, DO 08/18/23 2234

## 2023-08-18 NOTE — ED Notes (Signed)
Pt states that he cannot use the bathroom at this time. Pt was given a urinal to use when he had to go.

## 2023-08-18 NOTE — ED Triage Notes (Signed)
BIBA from home c/o fever and weakness.  Pt A&O x4 on arrival  Ems admin 650mg  tylenol and IVF.

## 2023-08-19 ENCOUNTER — Inpatient Hospital Stay (HOSPITAL_COMMUNITY): Payer: Medicare PPO

## 2023-08-19 DIAGNOSIS — B952 Enterococcus as the cause of diseases classified elsewhere: Secondary | ICD-10-CM | POA: Diagnosis not present

## 2023-08-19 DIAGNOSIS — G629 Polyneuropathy, unspecified: Secondary | ICD-10-CM

## 2023-08-19 DIAGNOSIS — R7881 Bacteremia: Secondary | ICD-10-CM | POA: Diagnosis not present

## 2023-08-19 DIAGNOSIS — A419 Sepsis, unspecified organism: Secondary | ICD-10-CM | POA: Diagnosis not present

## 2023-08-19 DIAGNOSIS — I480 Paroxysmal atrial fibrillation: Secondary | ICD-10-CM

## 2023-08-19 LAB — BLOOD CULTURE ID PANEL (REFLEXED) - BCID2

## 2023-08-19 LAB — BASIC METABOLIC PANEL
Anion gap: 13 (ref 5–15)
BUN: 34 mg/dL — ABNORMAL HIGH (ref 8–23)
CO2: 22 mmol/L (ref 22–32)
Calcium: 7.3 mg/dL — ABNORMAL LOW (ref 8.9–10.3)
Chloride: 98 mmol/L (ref 98–111)
Creatinine, Ser: 1.66 mg/dL — ABNORMAL HIGH (ref 0.61–1.24)
GFR, Estimated: 39 mL/min — ABNORMAL LOW (ref >60)
Glucose, Bld: 103 mg/dL — ABNORMAL HIGH (ref 70–99)
Potassium: 4 mmol/L (ref 3.5–5.1)
Sodium: 133 mmol/L — ABNORMAL LOW (ref 135–145)

## 2023-08-19 LAB — CBC
HCT: 25.3 % — ABNORMAL LOW (ref 39.0–52.0)
Hemoglobin: 8.1 g/dL — ABNORMAL LOW (ref 13.0–17.0)
MCH: 30.5 pg (ref 26.0–34.0)
MCHC: 32 g/dL (ref 30.0–36.0)
MCV: 95.1 fL (ref 80.0–100.0)
Platelets: 127 10*3/uL — ABNORMAL LOW (ref 150–400)
RBC: 2.66 MIL/uL — ABNORMAL LOW (ref 4.22–5.81)
RDW: 12.5 % (ref 11.5–15.5)
WBC: 13 10*3/uL — ABNORMAL HIGH (ref 4.0–10.5)
nRBC: 0 % (ref 0.0–0.2)

## 2023-08-19 LAB — PROTIME-INR
INR: 1.9 — ABNORMAL HIGH (ref 0.8–1.2)
Prothrombin Time: 21.6 s — ABNORMAL HIGH (ref 11.4–15.2)

## 2023-08-19 LAB — C-REACTIVE PROTEIN: CRP: 3.3 mg/dL — ABNORMAL HIGH (ref <1.0)

## 2023-08-19 LAB — SEDIMENTATION RATE: Sed Rate: 31 mm/h — ABNORMAL HIGH (ref 0–16)

## 2023-08-19 LAB — CORTISOL-AM, BLOOD: Cortisol - AM: 14.4 ug/dL (ref 6.7–22.6)

## 2023-08-19 LAB — PROCALCITONIN: Procalcitonin: 0.24 ng/mL

## 2023-08-19 MED ORDER — TRAZODONE HCL 50 MG PO TABS
50.0000 mg | ORAL_TABLET | Freq: Every day | ORAL | Status: DC
  Administered 2023-08-19 – 2023-08-22 (×4): 50 mg via ORAL
  Filled 2023-08-19 (×4): qty 1

## 2023-08-19 MED ORDER — SODIUM CHLORIDE 0.9 % IV SOLN
2.0000 g | Freq: Two times a day (BID) | INTRAVENOUS | Status: DC
  Administered 2023-08-19 – 2023-08-23 (×8): 2 g via INTRAVENOUS
  Filled 2023-08-19 (×8): qty 20

## 2023-08-19 MED ORDER — VANCOMYCIN HCL 750 MG/150ML IV SOLN: 750.0000 mg | INTRAVENOUS | Status: DC

## 2023-08-19 MED ORDER — SODIUM CHLORIDE 0.9 % IV SOLN
2.0000 g | Freq: Four times a day (QID) | INTRAVENOUS | Status: DC
  Administered 2023-08-20 – 2023-08-23 (×15): 2 g via INTRAVENOUS
  Filled 2023-08-19 (×21): qty 2000

## 2023-08-19 MED ORDER — LACTATED RINGERS IV SOLN: INTRAVENOUS | Status: DC

## 2023-08-19 MED ORDER — PROSIGHT PO TABS
1.0000 | ORAL_TABLET | Freq: Every day | ORAL | Status: DC
  Administered 2023-08-20 – 2023-08-23 (×4): 1 via ORAL
  Filled 2023-08-19 (×4): qty 1

## 2023-08-19 MED ORDER — GABAPENTIN 400 MG PO CAPS
800.0000 mg | ORAL_CAPSULE | Freq: Every day | ORAL | Status: DC
  Administered 2023-08-20 – 2023-08-23 (×4): 800 mg via ORAL
  Filled 2023-08-19 (×4): qty 2

## 2023-08-19 MED ORDER — OCUVITE-LUTEIN PO CAPS
1.0000 | ORAL_CAPSULE | Freq: Every day | ORAL | Status: DC
  Administered 2023-08-19: 1 via ORAL
  Filled 2023-08-19 (×2): qty 1

## 2023-08-19 MED ORDER — SODIUM CHLORIDE 0.9 % IV SOLN: 2.0000 g | INTRAVENOUS | Status: DC

## 2023-08-19 MED ORDER — ENOXAPARIN SODIUM 80 MG/0.8ML IJ SOSY
70.0000 mg | PREFILLED_SYRINGE | INTRAMUSCULAR | Status: DC
  Administered 2023-08-19 – 2023-08-21 (×3): 70 mg via SUBCUTANEOUS
  Filled 2023-08-19 (×3): qty 0.8

## 2023-08-19 NOTE — Assessment & Plan Note (Addendum)
-   Will resume home Neurontin.

## 2023-08-19 NOTE — Consult Note (Signed)
[facility]                                                                                        Patient Identification: Patient Name: Kevin Collins MRN: [id number] Admit Date: 08/18/2023  6:42 PM Today's Date: 08/19/2023 Reason for consult: Bacteremia  Requesting provider: Dr Jarvis Newcomer  Principal Problem:   Sepsis due to undetermined organism [facility]) Active Problems:   Dyslipidemia   Peripheral neuropathy   Paroxysmal atrial fibrillation (HCC)   Antibiotics:  Vancomycin 9/3 Metronidazole 9/3-c Cefepime 9/3, ceftriaxone 9/3  Lines/Hardware: biV PPM  Assessment # E faecalis bacteremia r/o endocarditis as well as CIED infection - no clear source, no GI or GU symptoms. Has a ulcer in the left lateral foot for which  he was taking abtx one day PTA ( unknown name)  # Left lateral foot ulcer, appears to be healing  Recommendations  Continue ampicillin and ceftriaxone as is Follow-up repeat blood cultures Follow-up TEE as well as EP recommendations  Consult podiatry/ortho for # 2.  Monitor CBC and CMP Following   Rest of the management as per the primary team. Please call with questions or concerns.  Thank you for the consult  __________________________________________________________________________________________________________ HPI and Hospital Course: 89 year old male w PMH of CAD s/p CABG, HTN, dyslipidemia, peripheral neuropathy, s/p biventricular pacemaker 01/24/2022 who presented to [facility] 9/3 with acute onset  fevers chills and generalized weakness for 1 day. Patient son called EMS as he appeared confused.  Received IVF and tylenol from EMS.  Denies any prodromal symptoms like cough, chest pain, SOB. Denies nausea, vomiting except stomach feels upset as well as some loose stools in the last 24 hours after being started on antibiotics. Reports taking p.o. antibiotics prior to coming to the  ED for left foot cellulitis with no redness, swelling or tenderness. His sons live with him.  At ED, febrile Labs remarkable for NA 130, AKI 1.95 WBC 13.6, platelets 148.  Lactic acid 0.9 Received IVF, abtx Imaging as below  Patient was transferred from [facility] to [facility] for E faecalis bacteremia for TEE as well as EP evaluation in the setting of presence of PPM   ROS: General- Denies  loss of appetite and loss of weight HEENT - Denies headache, blurry vision, neck pain, sinus pain Chest - Denies any chest pain, SOB or cough CVS- Denies any dizziness/lightheadedness, syncopal attacks, palpitations Abdomen- Denies any nausea, vomiting, abdominal pain, hematochezia  Neuro - Denies any weakness, numbness, tingling sensation Psych - Denies any changes in mood irritability or depressive symptoms GU- Denies any burning, dysuria, hematuria or increased frequency of urination Skin - denies any rashes/lesions MSK - denies any joint pain/swelling or restricted ROM   Past Medical History:  Diagnosis Date   Bradycardia, unspecified    CAD (coronary artery disease)    NATIVE WITHOUT ANGINA   Hereditary and idiopathic neuropathy, unspecified    History of shingles    Hyperlipidemia    Hypertension    ESSENTIAL PRIMARY   Peripheral neuropathy    Past Surgical History:  Procedure Laterality Date   BIV PACEMAKER INSERTION CRT-P N/A 01/24/2022  Procedure: BIV PACEMAKER INSERTION CRT-P;  Surgeon: Lanier Prude, MD;  Location: [facility] INVASIVE CV LAB;  Service: Cardiovascular;  Laterality: N/A;   CARDIAC CATHETERIZATION Left 09/1999   NORMAL LEFT MAIN, OCCLUDED LAD, OCCLUDED MID CFX, OCCLUDED PROXIMAL RCA, 60% STENOSIS PROXIMAL DIAG 1, RIGHT TO LEFT COLLATERAL, LEFT TO RIGHT COLLATERAL; LVEF OF 48% DOCUMENTED VIA NUCLEAR STUDY ON 07/12/2009.   CARDIOVERSION N/A 10/05/2019   Procedure: CARDIOVERSION;  Surgeon: Thurmon Fair, MD;  Location: [facility] ENDOSCOPY;  Service: Cardiovascular;  Laterality: N/A;    CATARACT EXTRACTION     CORONARY ARTERY BYPASS GRAFT     w/ LIMA TO LAD, SVG TO dx, SVG TO OM, SVG TO AM-PD-PL 10/12/99 [facility]   REMOVAL OF ANKLE PLATE      Scheduled Meds:  atorvastatin  40 mg Oral Daily   cinacalcet  90 mg Oral Daily   enoxaparin (LOVENOX) injection  70 mg Subcutaneous Q24H   ezetimibe  10 mg Oral Daily   [START ON 08/20/2023] gabapentin  800 mg Oral Daily   latanoprost  1 drop Both Eyes QHS   multivitamin-lutein  1 capsule Oral Daily   oxyCODONE-acetaminophen  1 tablet Oral TID   And   oxyCODONE  2.5 mg Oral TID   pneumococcal 20-valent conjugate vaccine  0.5 mL Intramuscular Tomorrow-1000   traZODone  50 mg Oral QHS   Continuous Infusions:  [START ON 08/20/2023] ampicillin (OMNIPEN) IV     cefTRIAXone (ROCEPHIN)  IV     lactated ringers 50 mL/hr at 08/19/23 1216   PRN Meds:.acetaminophen **OR** acetaminophen, magnesium hydroxide, ondansetron **OR** ondansetron (ZOFRAN) IV  No Known Allergies  Social History   Socioeconomic History   Marital status: Divorced    Spouse name: Not on file   Number of children: 1   Years of education: Not on file   Highest education level: Not on file  Occupational History   Occupation: RETIRED  Tobacco Use   Smoking status: Former   Smokeless tobacco: Never  Vaping Use   Vaping status: Never Used  Substance and Sexual Activity   Alcohol use: Never   Drug use: Never   Sexual activity: Not on file  Other Topics Concern   Not on file  Social History Narrative   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     Social Determinants of Health   Financial Resource Strain: Not on file  Food Insecurity: No Food Insecurity (08/18/2023)   Hunger Vital Sign    Worried About Running Out of Food in the Last Year: Never true    Ran Out of Food in the Last Year: Never true  Transportation Needs: No Transportation Needs (08/18/2023)   PRAPARE - Administrator, Civil Service (Medical): No    Lack of  Transportation (Non-Medical): No  Physical Activity: Not on file  Stress: Not on file  Social Connections: Not on file  Intimate Partner Violence: Not At Risk (08/18/2023)   Humiliation, Afraid, Rape, and Kick questionnaire    Fear of Current or Ex-Partner: No    Emotionally Abused: No    Physically Abused: No    Sexually Abused: No   Family History  Problem Relation Age of Onset   Other Mother        MALIGNANT TUMOR OF OVARY   CVA Father     Vitals  BP (!) 113/54 (BP Location: Right Arm)   Pulse (!) 59   Temp 97.7 F (36.5 C) (Oral)   Resp  18   Ht 5\' 10"  (1.778 m)   Wt 74.3 kg   SpO2 99%   BMI 23.50 kg/m    Physical Exam Constitutional: Elderly male sitting in the bed and appears comfortable, he is very hard of hearing    Comments: HEENT WNL  Cardiovascular:     Rate and Rhythm: Normal rate and regular rhythm.     Heart sounds: s1s2  Pulmonary:     Effort: Pulmonary effort is normal.     Comments: Normal lung sounds  Abdominal:     Palpations: Abdomen is soft.     Tenderness: Nondistended and nontender  Musculoskeletal:        General: No swelling or tenderness peripheral joints, left lateral foot has a small healing wound with no redness, swelling or tenderness  Skin:    Comments: No rashes, PPM site with no overlying redness, tenderness or swelling  Neurological:     General: Awake, alert and oriented, following commands  Psychiatric:        Mood and Affect: Mood normal.    Pertinent Microbiology Results for orders placed or performed during the hospital encounter of 08/18/23  Resp panel by RT-PCR (RSV, Flu A&B, Covid) Anterior Nasal Swab     Status: None   Collection Time: 08/18/23  6:48 PM   Specimen: Anterior Nasal Swab  Result Value Ref Range Status   SARS Coronavirus 2 by RT PCR NEGATIVE NEGATIVE Final    Comment: (NOTE) SARS-CoV-2 target nucleic acids are NOT DETECTED.  The SARS-CoV-2 RNA is generally detectable in upper  respiratory specimens during the acute phase of infection. The lowest concentration of SARS-CoV-2 viral copies this assay can detect is 138 copies/mL. A negative result does not preclude SARS-Cov-2 infection and should not be used as the sole basis for treatment or other patient management decisions. A negative result may occur with  improper specimen collection/handling, submission of specimen other than nasopharyngeal swab, presence of viral mutation(s) within the areas targeted by this assay, and inadequate number of viral copies(<138 copies/mL). A negative result must be combined with clinical observations, patient history, and epidemiological information. The expected result is Negative.  Fact Sheet for Patients:  BloggerCourse.com  Fact Sheet for Healthcare Providers:  SeriousBroker.it  This test is no t yet approved or cleared by the [place] FDA and  has been authorized for detection and/or diagnosis of SARS-CoV-2 by FDA under an Emergency Use Authorization (EUA). This EUA will remain  in effect (meaning this test can be used) for the duration of the COVID-19 declaration under Section 564(b)(1) of the Act, 21 U.S.C.section 360bbb-3(b)(1), unless the authorization is terminated  or revoked sooner.       Influenza A by PCR NEGATIVE NEGATIVE Final   Influenza B by PCR NEGATIVE NEGATIVE Final    Comment: (NOTE) The Xpert Xpress SARS-CoV-2/FLU/RSV plus assay is intended as an aid in the diagnosis of influenza from Nasopharyngeal swab specimens and should not be used as a sole basis for treatment. Nasal washings and aspirates are unacceptable for Xpert Xpress SARS-CoV-2/FLU/RSV testing.  Fact Sheet for Patients: BloggerCourse.com  Fact Sheet for Healthcare Providers: SeriousBroker.it  This test is not yet approved or cleared by the [place] FDA and has been  authorized for detection and/or diagnosis of SARS-CoV-2 by FDA under an Emergency Use Authorization (EUA). This EUA will remain in effect (meaning this test can be used) for the duration of the COVID-19 declaration under Section 564(b)(1) of the Act, 21 U.S.C. section 360bbb-3(b)(1),  unless the authorization is terminated or revoked.     Resp Syncytial Virus by PCR NEGATIVE NEGATIVE Final    Comment: (NOTE) Fact Sheet for Patients: BloggerCourse.com  Fact Sheet for Healthcare Providers: SeriousBroker.it  This test is not yet approved or cleared by the [place] FDA and has been authorized for detection and/or diagnosis of SARS-CoV-2 by FDA under an Emergency Use Authorization (EUA). This EUA will remain in effect (meaning this test can be used) for the duration of the COVID-19 declaration under Section 564(b)(1) of the Act, 21 U.S.C. section 360bbb-3(b)(1), unless the authorization is terminated or revoked.  Performed at [facility], [address], [place], [place] [address]   Blood Culture (routine x 2)     Status: Abnormal (Preliminary result)   Collection Time: 08/18/23  7:09 PM   Specimen: BLOOD RIGHT ARM  Result Value Ref Range Status   Specimen Description   Final    BLOOD RIGHT ARM Performed at [facility], [address] [place], [place] [address]    Special Requests   Final    BOTTLES DRAWN AEROBIC AND ANAEROBIC Blood Culture adequate volume Performed at [facility], [address] [place], [place] [address]    Culture  Setup Time   Final    GRAM POSITIVE COCCI IN CHAINS IN BOTH AEROBIC AND ANAEROBIC BOTTLES Gram Stain Report Called to,Read Back By and Verified With: BLACKWELL,S. ON 08/19/2023 AT 0929 BY FRATTO,A. CRITICAL RESULT CALLED TO, READ BACK BY AND VERIFIED WITH: PHARMD S HURTH [id number] AT 1412 BY CM    Culture (A)  Final    ENTEROCOCCUS FAECALIS SUSCEPTIBILITIES TO FOLLOW Performed at [facility], [address] [place], [place] [address]    Report Status PENDING  Incomplete  Blood Culture (routine x 2)     Status: None (Preliminary result)   Collection Time: 08/18/23  7:09 PM   Specimen: BLOOD LEFT ARM  Result Value Ref Range Status   Specimen Description   Final    BLOOD LEFT ARM Performed at [facility], [address] [place], [place] [address]    Special Requests   Final    BOTTLES DRAWN AEROBIC AND ANAEROBIC Blood Culture adequate volume Performed at [facility], [address] [place], [place] [address]    Culture  Setup Time   Final    GRAM POSITIVE COCCI IN CHAINS IN BOTH AEROBIC AND ANAEROBIC BOTTLES Gram Stain Report Called to,Read Back By and Verified With: BLACKWELL,S. ON 08/19/2023 AT 0929 BY FRATTO,A. CRITICAL VALUE NOTED.  VALUE IS CONSISTENT WITH PREVIOUSLY REPORTED AND CALLED VALUE. Performed at [facility], [address], [place], [place] [address]    Culture GRAM POSITIVE COCCI  Final   Report Status PENDING  Incomplete  Blood Culture ID Panel (Reflexed)     Status: Abnormal   Collection Time: 08/18/23  7:09 PM  Result Value Ref Range Status   Enterococcus faecalis DETECTED (A) NOT DETECTED Final    Comment: CRITICAL RESULT CALLED TO, READ BACK BY AND VERIFIED WITH: PHARMD S HURTH [id number] AT 1412 BY CM    Enterococcus Faecium NOT DETECTED NOT DETECTED Final   Listeria monocytogenes NOT DETECTED NOT DETECTED Final   Staphylococcus species NOT DETECTED NOT DETECTED Final   Staphylococcus aureus (BCID) NOT DETECTED NOT DETECTED Final   Staphylococcus epidermidis NOT DETECTED NOT DETECTED Final   Staphylococcus lugdunensis NOT DETECTED NOT DETECTED Final   Streptococcus species NOT DETECTED NOT DETECTED Final  Streptococcus agalactiae NOT DETECTED NOT DETECTED Final   Streptococcus pneumoniae NOT DETECTED NOT DETECTED Final   Streptococcus pyogenes NOT DETECTED NOT DETECTED Final   A.calcoaceticus-baumannii NOT DETECTED  NOT DETECTED Final   Bacteroides fragilis NOT DETECTED NOT DETECTED Final   Enterobacterales NOT DETECTED NOT DETECTED Final   Enterobacter cloacae complex NOT DETECTED NOT DETECTED Final   Escherichia coli NOT DETECTED NOT DETECTED Final   Klebsiella aerogenes NOT DETECTED NOT DETECTED Final   Klebsiella oxytoca NOT DETECTED NOT DETECTED Final   Klebsiella pneumoniae NOT DETECTED NOT DETECTED Final   Proteus species NOT DETECTED NOT DETECTED Final   Salmonella species NOT DETECTED NOT DETECTED Final   Serratia marcescens NOT DETECTED NOT DETECTED Final   Haemophilus influenzae NOT DETECTED NOT DETECTED Final   Neisseria meningitidis NOT DETECTED NOT DETECTED Final   Pseudomonas aeruginosa NOT DETECTED NOT DETECTED Final   Stenotrophomonas maltophilia NOT DETECTED NOT DETECTED Final   Candida albicans NOT DETECTED NOT DETECTED Final   Candida auris NOT DETECTED NOT DETECTED Final   Candida glabrata NOT DETECTED NOT DETECTED Final   Candida krusei NOT DETECTED NOT DETECTED Final   Candida parapsilosis NOT DETECTED NOT DETECTED Final   Candida tropicalis NOT DETECTED NOT DETECTED Final   Cryptococcus neoformans/gattii NOT DETECTED NOT DETECTED Final   Vancomycin resistance NOT DETECTED NOT DETECTED Final    Comment: Performed at [facility], [address] [place], [place] [address]  Culture, blood (Routine X 2) w Reflex to ID Panel     Status: None (Preliminary result)   Collection Time: 08/19/23 12:18 PM   Specimen: BLOOD  Result Value Ref Range Status   Specimen Description BLOOD LW  Final   Special Requests   Final    BOTTLES DRAWN AEROBIC AND ANAEROBIC Blood Culture adequate volume   Culture   Final    NO GROWTH < 24 HOURS Performed at [facility], [address], [place], [place] [address]    Report Status PENDING  Incomplete  Culture, blood (Routine X 2) w Reflex to ID Panel     Status: None (Preliminary result)   Collection Time: 08/19/23 12:18 PM   Specimen: BLOOD   Result Value Ref Range Status   Specimen Description BLOOD BLOOD RIGHT HAND  Final   Special Requests   Final    BOTTLES DRAWN AEROBIC AND ANAEROBIC Blood Culture adequate volume   Culture   Final    NO GROWTH < 24 HOURS Performed at [facility], [address], [place], [place] [address]    Report Status PENDING  Incomplete   Pertinent Lab seen by me:    Latest Ref Rng & Units 08/20/2023    9:48 AM 08/19/2023    4:17 AM 08/18/2023    6:58 PM  CBC  WBC 4.0 - 10.5 K/uL 9.3  13.0  13.6   Hemoglobin 13.0 - 17.0 g/dL 8.8  8.1  9.2   Hematocrit 39.0 - 52.0 % 27.7  25.3  28.4   Platelets 150 - 400 K/uL 114  127  148       Latest Ref Rng & Units 08/19/2023    4:17 AM 08/18/2023    6:58 PM 01/10/2022    1:17 PM  CMP  Glucose 70 - 99 mg/dL 269  485  89   BUN 8 - 23 mg/dL 34  36  31   Creatinine 0.61 - 1.24 mg/dL 4.62  7.03  5.00   Sodium 135 - 145 mmol/L 133  130  139   Potassium 3.5 - 5.1 mmol/L 4.0  4.4  4.9   Chloride 98 - 111 mmol/L 98  98  101   CO2 22 - 32 mmol/L 22  23  23    Calcium 8.9 - 10.3 mg/dL 7.3  7.3  8.1   Total Protein 6.5 - 8.1 g/dL  6.3    Total Bilirubin 0.3 - 1.2 mg/dL  0.9    Alkaline Phos 38 - 126 U/L  46    AST 15 - 41 U/L  15    ALT 0 - 44 U/L  16       Pertinent Imagings/Other Imagings Plain films and CT images have been personally visualized and interpreted; radiology reports have been reviewed. Decision making incorporated into the Impression / Recommendations.  DG Chest Port 1 View  Result Date: 08/18/2023 CLINICAL DATA:  Questionable sepsis - evaluate for abnormality. 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 Prior CABG. Heart and mediastinal contours within normal limits. Aortic atherosclerosis. No confluent opacities or effusions. No acute bony abnormality. IMPRESSION: No active disease. Electronically Signed   By: Charlett Nose M.D.   On: 08/18/2023 21:23   CUP PACEART  REMOTE DEVICE CHECK  Result Date: 08/04/2023 Scheduled remote reviewed. Normal device function.  Within the monitoring period, HF diagnostics have been abnormal. Next remote 91 days. - CS, CVRS   I have personally spent 84 minutes involved in face-to-face and non-face-to-face activities for this patient on the day of the visit. Professional time spent includes the following activities: Preparing to see the patient (review of tests), Obtaining and/or reviewing separately obtained history (admission/discharge record), Performing a medically appropriate examination and/or evaluation , Ordering medications/tests/procedures, referring and communicating with other health care professionals, Documenting clinical information in the EMR, Independently interpreting results (not separately reported), Communicating results to the patient/family/caregiver, Counseling and educating the patient/family/caregiver and Care coordination (not separately reported).  Electronically signed by:   Plan d/w requesting provider as well as ID pharm D  Note: This document was prepared using dragon voice recognition software and may include unintentional dictation errors.   Odette Fraction, MD Infectious Disease Physician [facility] Pager: [phone]

## 2023-08-19 NOTE — Progress Notes (Signed)
ANTICOAGULATION CONSULT NOTE - Initial Consult  Pharmacy Consult for enoxaparin Indication: atrial fibrillation  No Known Allergies  Patient Measurements: Height: 5\' 10"  (177.8 cm) Weight: 72.8 kg (160 lb 8 oz) IBW/kg (Calculated) : 73  Vital Signs: Temp: 99.3 F (37.4 C) (09/04 0414) Temp Source: Oral (09/04 0414) BP: 101/51 (09/04 0414) Pulse Rate: 60 (09/04 0414)  Labs: Recent Labs    08/18/23 1858 08/19/23 0417  HGB 9.2* 8.1*  HCT 28.4* 25.3*  PLT 148* 127*  APTT 36  --   LABPROT 17.8* 21.6*  INR 1.4* 1.9*  CREATININE 1.95* 1.66*    Estimated Creatinine Clearance: 29.8 mL/min (A) (by C-G formula based on SCr of 1.66 mg/dL (H)).   Medical History: Past Medical History:  Diagnosis Date   Bradycardia, unspecified    CAD (coronary artery disease)    NATIVE WITHOUT ANGINA   Hereditary and idiopathic neuropathy, unspecified    History of shingles    Hyperlipidemia    Hypertension    ESSENTIAL PRIMARY   Peripheral neuropathy     Medications:  Medications Prior to Admission  Medication Sig Dispense Refill Last Dose   bimatoprost (LUMIGAN) 0.01 % SOLN Place 1 drop into both eyes at bedtime.   Past Month   cinacalcet (SENSIPAR) 90 MG tablet Take 90 mg by mouth daily.   08/17/2023   ELIQUIS 2.5 MG TABS tablet TAKE 1 TABLET TWICE A DAY 180 tablet 3 08/17/2023 at 0830   ezetimibe (ZETIA) 10 MG tablet TAKE 1 TABLET DAILY (NEED APPOINTMENT FOR FUTURE REFILLS) 30 tablet 0 08/17/2023   furosemide (LASIX) 20 MG tablet Take 1 tablet (20 mg total) by mouth daily. 90 tablet 3 08/17/2023   gabapentin (NEURONTIN) 800 MG tablet Take 800 mg by mouth 3 (three) times daily.   08/17/2023   KRILL OIL PO Take 1 capsule by mouth daily.   08/17/2023   lisinopril (ZESTRIL) 20 MG tablet TAKE 1 TABLET DAILY (NEED APPOINTMENT FOR REFILLS) 90 tablet 3 08/17/2023   Multiple Vitamins-Minerals (PRESERVISION AREDS) CAPS Take 1 capsule by mouth 2 (two) times daily.   08/17/2023   oxyCODONE-acetaminophen  (PERCOCET) 7.5-325 MG tablet Take 1 tablet by mouth 3 (three) times daily.   08/18/2023   simvastatin (ZOCOR) 80 MG tablet TAKE 1 TABLET DAILY 90 tablet 3 08/17/2023   spironolactone (ALDACTONE) 25 MG tablet TAKE 1 TABLET DAILY 30 tablet 0 08/17/2023    Assessment: 89 y.o. male with a history of HFrEF, AFib s/p PPM for CRT, CAD s/p CABG, HTN, dyslipidemia, peripheral neuropathy, recently treated cellulitis of left 5th toe who presented to the ED on 08/18/2023 with fever and weakness. Temperature on arrival was 103.27F . BCX now + 4/4 bottles for GPC. MRI is pending. Patient on chronic anticoag with eliquis, last dose 4/9 at 0905. Pharmacy asked to switch to enoxaparin in anticipation of potential surgery.  Goal of Therapy:   Monitor platelets by anticoagulation protocol: Yes   Plan:  Enoxaparin 1mg /kg (70mg ) sq q24h to start this evening at 2200 Monitor for S/S of bleeding F/U transition to DOAC  Elder Cyphers, BS Pharm D, BCPS Clinical Pharmacist 08/19/2023,12:10 PM

## 2023-08-19 NOTE — Progress Notes (Signed)
[facility] PROGRESS NOTE  Kevin Collins (DOB: 08-29-1930) ZOX:[id number] PCP: Toma Deiters, MD  Brief Narrative: Kevin Collins is a 89 y.o. male with a history of HFrEF, AFib s/p PPM for CRT, CAD s/p CABG, HTN, dyslipidemia, peripheral neuropathy, recently treated cellulitis of left 5th toe who presented to the ED on 08/18/2023 with fever and weakness. Temperature on arrival was 103.79F, WBC 13.6k, HR 60 with paced rhythm on ECG, BP 105/60. IV fluids and broad IV antibiotics were given for sepsis. Source was not identified by UA or CXR, though there is high degree of suspicion for left foot infection complicated by insensate neuropathy. Blood cultures drawn on admission have grown GPCs in 4 of 4 collections. MRI is pending, ID consulted.   Subjective: Denies pain in the foot, no urinary or respiratory symptoms either. No one sick around him. No diarrhea. He completed antibiotics a couple days ago for the left foot.   Objective: BP (!) 101/51 (BP Location: Left Arm)   Pulse 60   Temp 99.3 F (37.4 C) (Oral)   Resp 16   Ht 5\' 10"  (1.778 m)   Wt 72.8 kg   SpO2 93%   BMI 23.03 kg/m   Gen: WDWN elderly male in no distress Pulm: Clear, nonlabored  CV: Regular paced at 60bpm, no MRG, slight edema about the left foot, but not otherwise. No JVD. Mucous membranes tacky. GI: Soft, NT, ND, +BS Neuro: Alert and oriented. No new focal deficits. Ext: Warm, dry, palpable DP pulses.  Skin: Pinkish erythema about the left latera forefoot without open wound. Tender with palpation, though sensation is quite diminished in general in both feet. Callus noted on medial and lateral forefoot bilaterally. No sacral wounds.  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  - Continue IVF. Despite hx CHF, he appears euvolemic and not eating/drinking much. Fortunately he appears nontoxic and VSS.  - No abscess grossly on  exam, though there is erythema and pain. MRI ordered. Discussed with MRI tech at [facility], they do not have availability of rep to reprogram pt's PPM. I then spoke with MRI at [facility] who confirmed with rep that this pacemaker is compatible with MRI and that a rep can reprogram it there after MRI.  - Discussed with ID, Drs. Drue Second and [place], who will consult at [facility].  - Will need TEE, likely to be done Friday per cardiology.  - Repeat blood cultures. Check CRP, ESR. - Continue vancomycin. Also on cefepime and flagyl pending formal ID opinion.  - Based on MRI results, may need podiatry/orthopedics consultation.  PAF: s/p PPM Jan 24, 2022. Pt of Dr. Lovena Neighbours.  - Will change eliquis to lovenox pending MRI and possible operative plans.   CAD s/p CABG: No anginal symptoms. - Continue anticoagulation. Not on BB. Continue statin.  HFrEF:  - TEE to be performed as above - Hold lisinopril and spironolactone given elevated creatinine and low-normal BP.  Peripheral neuropathy:  - Continue gabapentin, with current CrCl of 29.21ml/min, will decrease 800mg  TID to 800mg  daily  Stage IIIb CKD, hyponatremia: Baseline SCr not entirely clear.  - Cr and Na improved a bit with fluids. With concomitant CHF, continuing IVF at 50cc/hr and holding lasix 20mg  daily today  Dyslipidemia:  - Continue statin, zetia  Anemia, thrombocytopenia:  - Monitor in AM. Down with fluids but no bleeding noted.  DNR: POA.  Tyrone Nine, MD [facility] [email or web address] 08/19/2023, 11:20  AM

## 2023-08-19 NOTE — Plan of Care (Signed)

## 2023-08-19 NOTE — Assessment & Plan Note (Signed)
-   The patient has significant fever, rule out sepsis. - He will be admitted to a medical telemetry bed. - We will continue hydration with IV normal saline. - We will follow blood cultures. - We will continue broad-spectrum antibiotic therapy with IV vancomycin, cefepime and Flagyl.

## 2023-08-19 NOTE — Progress Notes (Signed)
Pharmacy Antibiotic Note  Kevin Collins is a 89 y.o. male admitted on 08/18/2023 with significant fever and concern for sepsis.  Pharmacy has been consulted for vancomycin and cefepime dosing.  Plan: Vancomycin 1500mg  x1 then 750mg  IV Q48H. Goal AUC 400-550.  Expected AUC 415. Cefepime 2g IV Q24H.  Height: 5\' 9"  (175.3 cm) Weight: 72.8 kg (160 lb 8 oz) IBW/kg (Calculated) : 70.7  Temp (24hrs), Avg:100.4 F (38 C), Min:98.9 F (37.2 C), Max:103.2 F (39.6 C)  Recent Labs  Lab 08/18/23 1858  WBC 13.6*  CREATININE 1.95*  LATICACIDVEN 0.9    Estimated Creatinine Clearance: 24.7 mL/min (A) (by C-G formula based on SCr of 1.95 mg/dL (H)).    No Known Allergies  Thank you for allowing pharmacy to be a part of this patient's care.  Vernard Gambles, PharmD, BCPS  08/19/2023 2:50 AM

## 2023-08-19 NOTE — Progress Notes (Signed)
Date and time results received: 08/19/23 0918 (use smartphrase ".now" to insert current time)  Test: Blood cultures, aerobic/anaerobic Critical Value: Gram positive cocci in chains  Name of Provider Notified: Hazeline Junker, MD  Orders Received? Or Actions Taken?:

## 2023-08-19 NOTE — Progress Notes (Addendum)
PHARMACY - PHYSICIAN COMMUNICATION CRITICAL VALUE ALERT - BLOOD CULTURE IDENTIFICATION (BCID)  Kevin Collins is an 89 y.o. 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  Name of physician (or Provider) Contacted: Dr. Jarvis Newcomer  Current antibiotics: Vanco/Cefepime/flagyl  Changes to prescribed antibiotics recommended:  ID to see when patient transfers to [facility]- Follow-up ampicillin  Results for orders placed or performed during the hospital encounter of 08/18/23  Blood Culture ID Panel (Reflexed) (Collected: 08/18/2023  7:09 PM)  Result Value Ref Range   Enterococcus faecalis DETECTED (A) NOT DETECTED   Enterococcus Faecium NOT DETECTED NOT DETECTED   Listeria monocytogenes NOT DETECTED NOT DETECTED   Staphylococcus species NOT DETECTED NOT DETECTED   Staphylococcus aureus (BCID) NOT DETECTED NOT DETECTED   Staphylococcus epidermidis NOT DETECTED NOT DETECTED   Staphylococcus lugdunensis NOT DETECTED NOT DETECTED   Streptococcus species NOT DETECTED NOT DETECTED   Streptococcus agalactiae NOT DETECTED NOT DETECTED   Streptococcus pneumoniae NOT DETECTED NOT DETECTED   Streptococcus pyogenes NOT DETECTED NOT DETECTED   A.calcoaceticus-baumannii NOT DETECTED NOT DETECTED   Bacteroides fragilis NOT DETECTED NOT DETECTED   Enterobacterales NOT DETECTED NOT DETECTED   Enterobacter cloacae complex NOT DETECTED NOT DETECTED   Escherichia coli NOT DETECTED NOT DETECTED   Klebsiella aerogenes NOT DETECTED NOT DETECTED   Klebsiella oxytoca NOT DETECTED NOT DETECTED   Klebsiella pneumoniae NOT DETECTED NOT DETECTED   Proteus species NOT DETECTED NOT DETECTED   Salmonella species NOT DETECTED NOT DETECTED   Serratia marcescens NOT DETECTED NOT DETECTED   Haemophilus influenzae NOT DETECTED NOT DETECTED   Neisseria meningitidis NOT DETECTED NOT DETECTED   Pseudomonas aeruginosa NOT DETECTED NOT  DETECTED   Stenotrophomonas maltophilia NOT DETECTED NOT DETECTED   Candida albicans NOT DETECTED NOT DETECTED   Candida auris NOT DETECTED NOT DETECTED   Candida glabrata NOT DETECTED NOT DETECTED   Candida krusei NOT DETECTED NOT DETECTED   Candida parapsilosis NOT DETECTED NOT DETECTED   Candida tropicalis NOT DETECTED NOT DETECTED   Cryptococcus neoformans/gattii NOT DETECTED NOT DETECTED   Vancomycin resistance NOT DETECTED NOT DETECTED    Tad Moore 08/19/2023  2:18 PM

## 2023-08-19 NOTE — Assessment & Plan Note (Addendum)
-   We will continue statin therapy as well as Zetia. 

## 2023-08-19 NOTE — Progress Notes (Signed)
Pharmacy Antibiotic Note  Kevin Collins is a 89 y.o. male admitted on 08/18/2023 with E faecalis bacteremia. Initial concern for L-foot cellulitis but bacteremia with known ICD warrants rule out ICD infection/endocarditis. Pharmacy has been consulted for Ampicillin dosing and Rocephin for synergy pending additional work-up.  AKI on admission resolving with SCr down to 1.66 << 1.95 (BL 1.2-1.5), CrCl~30 ml/min.   Plan: - Start Ampicillin 2g IV every 6 hours - Start Ceftriaxone 2g IV every 12 hours - Will continue to follow renal function, culture results, LOT, and antibiotic de-escalation plans   Height: 5\' 10"  (177.8 cm) Weight: 72.8 kg (160 lb 8 oz) IBW/kg (Calculated) : 73  Temp (24hrs), Avg:100.1 F (37.8 C), Min:98.9 F (37.2 C), Max:103.2 F (39.6 C)  Recent Labs  Lab 08/18/23 1858 08/19/23 0417  WBC 13.6* 13.0*  CREATININE 1.95* 1.66*  LATICACIDVEN 0.9  --     Estimated Creatinine Clearance: 29.8 mL/min (A) (by C-G formula based on SCr of 1.66 mg/dL (H)).    No Known Allergies  Antimicrobials this admission: CRO 9/3 x 1; restart 9/4 >> Cefepime 9/4 x 1 Flagyl 9/4 >> 9/4 Vancomycin 9/4 x 1 Ampicillin 9/5 >>  Dose adjustments this admission:   Microbiology results: 9/3 BCx >> 4/4 GPC in chains >> BCID E faecalis 9/3 COVID/Flu/RSV >> neg 9/4 BCx >>  Thank you for allowing pharmacy to be a part of this patient's care.  Georgina Pillion, PharmD, BCPS, BCIDP Infectious Diseases Clinical Pharmacist 08/19/2023 2:40 PM   **Pharmacist phone directory can now be found on [email or web address] ([email or web address]).  Listed under [facility].

## 2023-08-19 NOTE — Progress Notes (Signed)
Mobility Specialist Progress Note:    08/19/23 1030  Mobility  Activity Ambulated with assistance in hallway  Level of Assistance Standby assist, set-up cues, supervision of patient - no hands on  Assistive Device Front wheel walker  Distance Ambulated (ft) 100 ft  Range of Motion/Exercises Active;All extremities  Activity Response Tolerated well  Mobility Referral Yes  $Mobility charge 1 Mobility  Mobility Specialist Start Time (ACUTE ONLY) 1030  Mobility Specialist Stop Time (ACUTE ONLY) 1045  Mobility Specialist Time Calculation (min) (ACUTE ONLY) 15 min   Pt received in bed, agreeable to mobility. Required SBA to stand and ambulate with RW. Tolerated well, baseline HR was 65 BPM while sitting EOB. 60 BPM after standing, and 42 BPM during ambulation. Took a standing rest break, HR recovered to 61 BPM. Returned pt to room, HR 65 BPM after ambulation. Call bell in reach, all needs met.   Lawerance Bach Mobility Specialist Please contact via Special educational needs teacher or  Rehab office at [phone]

## 2023-08-19 NOTE — Progress Notes (Signed)
Pt lives with two sons. He is independent with ADLs at baseline and still drives. No current home health services. TOC will follow.    08/19/23 0806  TOC Brief Assessment  Insurance and Status Reviewed  Patient has primary care physician Yes  Home environment has been reviewed Lives with 2 sons.  Prior level of function: Independent.  Prior/Current Home Services No current home services  Social Determinants of Health Reivew SDOH reviewed no interventions necessary  Readmission risk has been reviewed Yes  Transition of care needs no transition of care needs at this time

## 2023-08-19 NOTE — Assessment & Plan Note (Signed)
-   Continue Eliquis 

## 2023-08-19 NOTE — Plan of Care (Signed)

## 2023-08-20 ENCOUNTER — Inpatient Hospital Stay (HOSPITAL_COMMUNITY): Payer: Medicare PPO

## 2023-08-20 ENCOUNTER — Encounter (HOSPITAL_COMMUNITY): Payer: Self-pay | Admitting: Anesthesiology

## 2023-08-20 DIAGNOSIS — A419 Sepsis, unspecified organism: Secondary | ICD-10-CM | POA: Diagnosis not present

## 2023-08-20 DIAGNOSIS — R7881 Bacteremia: Secondary | ICD-10-CM

## 2023-08-20 DIAGNOSIS — Z7189 Other specified counseling: Secondary | ICD-10-CM | POA: Diagnosis not present

## 2023-08-20 DIAGNOSIS — Z66 Do not resuscitate: Secondary | ICD-10-CM | POA: Diagnosis not present

## 2023-08-20 DIAGNOSIS — Z515 Encounter for palliative care: Secondary | ICD-10-CM | POA: Diagnosis not present

## 2023-08-20 LAB — CBC
HCT: 27.7 % — ABNORMAL LOW (ref 39.0–52.0)
Hemoglobin: 8.8 g/dL — ABNORMAL LOW (ref 13.0–17.0)
MCH: 30.2 pg (ref 26.0–34.0)
MCHC: 31.8 g/dL (ref 30.0–36.0)
MCV: 95.2 fL (ref 80.0–100.0)
Platelets: 114 10*3/uL — ABNORMAL LOW (ref 150–400)
RBC: 2.91 MIL/uL — ABNORMAL LOW (ref 4.22–5.81)
RDW: 12.1 % (ref 11.5–15.5)
WBC: 9.3 10*3/uL (ref 4.0–10.5)
nRBC: 0 % (ref 0.0–0.2)

## 2023-08-20 LAB — BASIC METABOLIC PANEL
Anion gap: 12 (ref 5–15)
BUN: 29 mg/dL — ABNORMAL HIGH (ref 8–23)
CO2: 19 mmol/L — ABNORMAL LOW (ref 22–32)
Calcium: 7.2 mg/dL — ABNORMAL LOW (ref 8.9–10.3)
Chloride: 101 mmol/L (ref 98–111)
Creatinine, Ser: 1.5 mg/dL — ABNORMAL HIGH (ref 0.61–1.24)
GFR, Estimated: 44 mL/min — ABNORMAL LOW (ref >60)
Glucose, Bld: 101 mg/dL — ABNORMAL HIGH (ref 70–99)
Potassium: 3.6 mmol/L (ref 3.5–5.1)
Sodium: 132 mmol/L — ABNORMAL LOW (ref 135–145)

## 2023-08-20 LAB — ECHOCARDIOGRAM COMPLETE
Area-P 1/2: 4.4 cm2
Height: 70 in
S' Lateral: 4.3 cm
Weight: 2620.83 [oz_av]

## 2023-08-20 MED ORDER — PERFLUTREN LIPID MICROSPHERE
1.0000 mL | INTRAVENOUS | Status: AC | PRN
  Administered 2023-08-20: 10 mL via INTRAVENOUS

## 2023-08-20 MED ORDER — MELATONIN 3 MG PO TABS
3.0000 mg | ORAL_TABLET | Freq: Every day | ORAL | Status: DC
  Administered 2023-08-20 – 2023-08-22 (×3): 3 mg via ORAL
  Filled 2023-08-20 (×3): qty 1

## 2023-08-20 NOTE — Progress Notes (Signed)
   [facility] has been requested to perform a transesophageal echocardiogram on Kevin Collins for bacteremia and concern for CIED infection.  After careful review of history and examination, the risks and benefits of transesophageal echocardiogram have been explained including risks of esophageal damage, perforation (1:10,000 risk), bleeding, pharyngeal hematoma as well as other potential complications associated with conscious sedation including aspiration, arrhythmia, respiratory failure and death. Alternatives to treatment were discussed, questions were answered. Patient is willing to proceed.   Abagail Kitchens, PA-C  08/20/2023 2:07 PM

## 2023-08-20 NOTE — Consult Note (Signed)
Consultation Note Date: 08/20/2023   Patient Name: Kevin Collins  DOB: 1930-01-11  MRN: [id number]  Age / Sex: 89 y.o., male  PCP: Toma Deiters, MD Referring Physician: Burnadette Pop, MD  Reason for Consultation: Establishing goals of care  HPI/Patient Profile: 89 y.o. male  with past medical history of systolic congestive heart failure, A-fib status post pacemaker placement, CAD status post CABG, hypertension, hyperlipidemia, peripheral neuropathy, and recently treated cellulitis of the left fifth toe admitted on 08/18/2023 with complaint of fever, weakness . Blood cultures showed Enterococcus faecalis. Unclear etiology - had left foot infection? Patient has pacemaker and there is concern for CIED infection. TEE being considered. PMT consulted to discuss GOC.   Clinical Assessment and Goals of Care: I have reviewed medical records including EPIC notes, labs and imaging, assessed the patient and then spoke with patient's son Casimiro Needle  to discuss diagnosis prognosis, GOC, EOL wishes, disposition and options.  Patient was having echo and so conversation limited also very hard of hearing. He gave me permission to speak with his son. He does share that he has had poor sleep and ongoing diarrhea.   Call to Casimiro Needle, I introduced Palliative Medicine as specialized medical care for people living with serious illness. It focuses on providing relief from the symptoms and stress of a serious illness. The goal is to improve quality of life for both the patient and the family.  Casimiro Needle shares at baseline patient was doing very well - still driving. Self-sufficient. Managed his own finances. Cognitively sharp.  He walked a quarter mile daily.    We discussed patient's current illness and what it means in the larger context of patient's on-going co-morbidities.  We discussed consideration of TEE given patient's bacteremia and pacemaker. They will have more  conversations with medical team regarding this.   I attempted to elicit values and goals of care important to the patient.  Casimiro Needle shares that patient is most interested in getting back home ASAP. He does not like being in the hospital and receiving unfamiliar medications.   Casimiro Needle shares they are open to medical recommendations to manage patient's bacteremia. Ongoing discussions.   Discussed with son the importance of continued conversation with family and the medical providers regarding overall plan of care and treatment options, ensuring decisions are within the context of the patient's values and GOCs.    Questions and concerns were addressed. The family was encouraged to call with questions or concerns.     Primary Decision Maker PATIENT    SUMMARY OF RECOMMENDATIONS   Ongoing conversations with medical team regarding TEE - family and patient open to this if recommended Established DNR/DNI  Code Status/Advance Care Planning: DNR     Primary Diagnoses: Present on Admission:  Sepsis due to undetermined organism (HCC)  Dyslipidemia   I have reviewed the medical record, interviewed the patient and family, and examined the patient. The following aspects are pertinent.  Past Medical History:  Diagnosis Date   Bradycardia, unspecified    CAD (coronary artery disease)    NATIVE WITHOUT ANGINA   Hereditary and idiopathic neuropathy, unspecified    History of shingles    Hyperlipidemia    Hypertension    ESSENTIAL PRIMARY   Peripheral neuropathy    Social History   Socioeconomic History   Marital status: Divorced    Spouse name: Not on file   Number of children: 1   Years of education: Not on file   Highest education level: Not on file  Occupational History   Occupation: RETIRED  Tobacco Use   Smoking status: Former   Smokeless tobacco: Never  Advertising account planner   Vaping status: Never Used  Substance and Sexual Activity   Alcohol use: Never   Drug use: Never    Sexual activity: Not on file  Other Topics Concern   Not on file  Social History Narrative   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     Social Determinants of Health   Financial Resource Strain: Not on file  Food Insecurity: No Food Insecurity (08/18/2023)   Hunger Vital Sign    Worried About Running Out of Food in the Last Year: Never true    Ran Out of Food in the Last Year: Never true  Transportation Needs: No Transportation Needs (08/18/2023)   PRAPARE - Administrator, Civil Service (Medical): No    Lack of Transportation (Non-Medical): No  Physical Activity: Not on file  Stress: Not on file  Social Connections: Not on file   Family History  Problem Relation Age of Onset   Other Mother        MALIGNANT TUMOR OF OVARY   CVA Father    Scheduled Meds:  atorvastatin  40 mg Oral Daily   cinacalcet  90 mg Oral Daily   enoxaparin (LOVENOX) injection  70 mg Subcutaneous Q24H   ezetimibe  10 mg Oral Daily   gabapentin  800 mg Oral Daily   latanoprost  1 drop Both Eyes QHS   multivitamin  1 tablet Oral Daily   oxyCODONE-acetaminophen  1 tablet Oral TID   And   oxyCODONE  2.5 mg Oral TID   pneumococcal 20-valent conjugate vaccine  0.5 mL Intramuscular Tomorrow-1000   traZODone  50 mg Oral QHS   Continuous Infusions:  ampicillin (OMNIPEN) IV Stopped (08/20/23 1322)   cefTRIAXone (ROCEPHIN)  IV Stopped (08/20/23 1222)   lactated ringers 50 mL/hr at 08/20/23 1506   PRN Meds:.acetaminophen **OR** acetaminophen, magnesium hydroxide, ondansetron **OR** ondansetron (ZOFRAN) IV, perflutren lipid microspheres (DEFINITY) IV suspension No Known Allergies Review of Systems  Gastrointestinal:  Positive for diarrhea.  Psychiatric/Behavioral:  Positive for sleep disturbance.     Physical Exam Constitutional:      General: He is not in acute distress. Pulmonary:     Effort: Pulmonary effort is normal.  Skin:    General: Skin is warm and dry.  Neurological:      Mental Status: He is alert and oriented to person, place, and time.     Vital Signs: BP (!) 125/41 (BP Location: Left Arm)   Pulse 61   Temp (!) 97.5 F (36.4 C) (Oral)   Resp 18   Ht 5\' 10"  (1.778 m)   Wt 74.3 kg   SpO2 99%   BMI 23.50 kg/m  Pain Scale: 0-10   Pain Score: 0-No pain   SpO2: SpO2: 99 % O2 Device:SpO2: 99 % O2 Flow Rate: .   IO: Intake/output summary:  Intake/Output Summary (Last 24 hours) at 08/20/2023 1614 Last data filed at 08/20/2023 1506 Gross per 24 hour  Intake 1673.76 ml  Output --  Net 1673.76 ml    LBM: Last BM Date : 08/20/23 Baseline Weight: Weight: 73 kg Most recent weight: Weight: 74.3 kg     Palliative Assessment/Data: PPS 60%     *Please note that this is a verbal dictation therefore any spelling or grammatical errors are due to the "Dragon Medical One" system interpretation.  Fabian November  Andi Devon, DNP, AGNP-C Palliative Medicine Team [phone] Pager: [phone]

## 2023-08-20 NOTE — Progress Notes (Signed)
PROGRESS NOTE  Kevin Collins  ZOX:[id number] DOB: [birth year] DOA: 08/18/2023 PCP: Toma Deiters, MD   Brief Narrative: Patient is a 89 year old male with history of systolic congestive heart failure, A-fib status post pacemaker placement, CAD status post CABG, hypertension, hyperlipidemia, peripheral neuropathy, recently treated cellulitis of the left fifth toe who presented to the emergency department with complaint of fever, weakness.  He had high-grade fever on presentation of 103F, leukocytosis of 13.6 K.  Blood pressure was soft.  Patient was started on IV fluid, broad-spectrum antibiotics for possible sepsis.  Suspicion for left foot infection.  Blood cultures showed Enterococcus faecalis.  ID consulted.  Currently on ampicillin, ceftriaxone.  Cardiology/orthopedics consulted today  Assessment & Plan:  Principal Problem:   Sepsis due to undetermined organism [facility]) Active Problems:   Dyslipidemia   Paroxysmal atrial fibrillation (HCC)   Peripheral neuropathy  Enterococcus faecalis bacteremia: Unclear etiology.  Has eft foot infection but not clear if this is the source for bacteremia.  Presented with fever, soft blood pressure.  Started on gentle IV flui. ID following.  Follow-up culture report.  Might need TEE.Has mild leucocytosis.  Cardiology/EP consulted  Left foot cellulitis/possible osteomyelitis: Recent history of left foot cellulitis treated with antibiotics.    Continue current antibiotics for now.  Orthopedics consulted  Diarrhea: Complains of frequent loose stools.  No abdominal pain, nausea or vomiting.  Could be from antibiotics.  Will check GI pathogen panel  Paroxysmal A-fib: Status post pacemaker placement.  Takes Eliquis at home, changed to Lovenox.  Normal sinus rhythm  Coronary artery disease: Status post CABG.  No anginal symptoms.  Not on beta-blocker.  On statin  Systolic congestive heart failure: On lisinopril, Lasix, spironolactone at home.  Currently appears  euvolemic  CKD stage IIIb: Unclear baseline creatinine.  Last creatinine 01/10/2022 was 1.89 before that his creatinine was ranging in the range of 1.4-1.5 . Now on gentle IV fluids.  Lasix on hold.  Kidney function improving  Hyperlipidemia: On statin, Zetia  Anemia/thrombocytopenia: Probably chronic.  Stable  Goals of care: Elderly patient, CODE STATUS DNR.  Will consult palliative  care for further discussion.  Son is interested to discuss about goals of care.         DVT prophylaxis:Lovenox     Code Status: Limited: Do not attempt resuscitation (DNR) -DNR-LIMITED -Do Not Intubate/DNI   Family Communication: Called and discussed with son on phone on 9/5  Patient status:Inpatient  Patient is from :Home  Anticipated discharge WJ:[facility] vs SNF  Estimated DC date:not sure   Consultants: ID,orthopedics,cardiology  Procedures:None yet  Antimicrobials:  Anti-infectives (From admission, onward)    Start     Dose/Rate Route Frequency Ordered Stop   08/21/23 0400  vancomycin (VANCOREADY) IVPB 750 mg/150 mL  Status:  Discontinued        750 mg 150 mL/hr over 60 Minutes Intravenous Every 48 hours 08/19/23 0252 08/19/23 1436   08/20/23 0000  ceFEPIme (MAXIPIME) 2 g in sodium chloride 0.9 % 100 mL IVPB  Status:  Discontinued        2 g 200 mL/hr over 30 Minutes Intravenous Every 24 hours 08/19/23 0252 08/19/23 1436   08/20/23 0000  ampicillin (OMNIPEN) 2 g in sodium chloride 0.9 % 100 mL IVPB        2 g 300 mL/hr over 20 Minutes Intravenous Every 6 hours 08/19/23 1436     08/19/23 2200  cefTRIAXone (ROCEPHIN) 2 g in sodium chloride 0.9 % 100 mL IVPB  2 g 200 mL/hr over 30 Minutes Intravenous Every 12 hours 08/19/23 1436     08/19/23 0045  vancomycin (VANCOREADY) IVPB 1500 mg/300 mL        1,500 mg 150 mL/hr over 120 Minutes Intravenous  Once 08/18/23 2356 08/19/23 0527   08/18/23 2330  ceFEPIme (MAXIPIME) 2 g in sodium chloride 0.9 % 100 mL IVPB        2 g 200 mL/hr  over 30 Minutes Intravenous  Once 08/18/23 2328 08/19/23 0139   08/18/23 2330  metroNIDAZOLE (FLAGYL) IVPB 500 mg  Status:  Discontinued        500 mg 100 mL/hr over 60 Minutes Intravenous Every 12 hours 08/18/23 2328 08/19/23 1436   08/18/23 2330  vancomycin (VANCOCIN) IVPB 1000 mg/200 mL premix  Status:  Discontinued        1,000 mg 200 mL/hr over 60 Minutes Intravenous  Once 08/18/23 2328 08/18/23 2355   08/18/23 2000  cefTRIAXone (ROCEPHIN) 1 g in sodium chloride 0.9 % 100 mL IVPB        1 g 200 mL/hr over 30 Minutes Intravenous  Once 08/18/23 1947 08/18/23 2149       Subjective: Patient seen and examined the bedside today.  Hemodynamically stable.  Very hard of hearing.  Overall is comfortable.  Lying in bed.  No pain on the left foot.  Complains of frequent diarrhea.  No nausea or vomiting or abdominal pain  Objective: Vitals:   08/19/23 2019 08/20/23 0017 08/20/23 0422 08/20/23 0643  BP: (!) 107/50 (!) 105/51 (!) 120/56   Pulse: 60 60 (!) 59   Resp: 18 18 18    Temp: 97.9 F (36.6 C) 98 F (36.7 C) (!) 97.5 F (36.4 C)   TempSrc: Oral Oral Oral   SpO2: 100% 97% 98%   Weight:    74.3 kg  Height:        Intake/Output Summary (Last 24 hours) at 08/20/2023 0750 Last data filed at 08/19/2023 1610 Gross per 24 hour  Intake 180 ml  Output --  Net 180 ml   Filed Weights   08/18/23 1904 08/18/23 2326 08/20/23 0643  Weight: 73 kg 72.8 kg 74.3 kg    Examination:  General exam: Overall comfortable, not in distress, pleasant elderly male HEENT: PERRL, hard of hearing Respiratory system:  no wheezes or crackles  Cardiovascular system: S1 & S2 heard, RRR.  Pacemaker Gastrointestinal system: Abdomen is nondistended, soft and nontender. Central nervous system: Alert and oriented Extremities: No edema, no clubbing ,no cyanosis, ulcer on the lateral left foot Skin: No rashes, no ulcers,no icterus     Data Reviewed: I have personally reviewed following labs and imaging  studies  CBC: Recent Labs  Lab 08/18/23 1858 08/19/23 0417  WBC 13.6* 13.0*  NEUTROABS 11.5*  --   HGB 9.2* 8.1*  HCT 28.4* 25.3*  MCV 93.7 95.1  PLT 148* 127*   Basic Metabolic Panel: Recent Labs  Lab 08/18/23 1858 08/19/23 0417  NA 130* 133*  K 4.4 4.0  CL 98 98  CO2 23 22  GLUCOSE 108* 103*  BUN 36* 34*  CREATININE 1.95* 1.66*  CALCIUM 7.3* 7.3*     Recent Results (from the past 240 hour(s))  Resp panel by RT-PCR (RSV, Flu A&B, Covid) Anterior Nasal Swab     Status: None   Collection Time: 08/18/23  6:48 PM   Specimen: Anterior Nasal Swab  Result Value Ref Range Status   SARS Coronavirus 2 by RT PCR NEGATIVE  NEGATIVE Final    Comment: (NOTE) SARS-CoV-2 target nucleic acids are NOT DETECTED.  The SARS-CoV-2 RNA is generally detectable in upper respiratory specimens during the acute phase of infection. The lowest concentration of SARS-CoV-2 viral copies this assay can detect is 138 copies/mL. A negative result does not preclude SARS-Cov-2 infection and should not be used as the sole basis for treatment or other patient management decisions. A negative result may occur with  improper specimen collection/handling, submission of specimen other than nasopharyngeal swab, presence of viral mutation(s) within the areas targeted by this assay, and inadequate number of viral copies(<138 copies/mL). A negative result must be combined with clinical observations, patient history, and epidemiological information. The expected result is Negative.  Fact Sheet for Patients:  BloggerCourse.com  Fact Sheet for Healthcare Providers:  SeriousBroker.it  This test is no t yet approved or cleared by the [place] FDA and  has been authorized for detection and/or diagnosis of SARS-CoV-2 by FDA under an Emergency Use Authorization (EUA). This EUA will remain  in effect (meaning this test can be used) for the duration of  the COVID-19 declaration under Section 564(b)(1) of the Act, 21 U.S.C.section 360bbb-3(b)(1), unless the authorization is terminated  or revoked sooner.       Influenza A by PCR NEGATIVE NEGATIVE Final   Influenza B by PCR NEGATIVE NEGATIVE Final    Comment: (NOTE) The Xpert Xpress SARS-CoV-2/FLU/RSV plus assay is intended as an aid in the diagnosis of influenza from Nasopharyngeal swab specimens and should not be used as a sole basis for treatment. Nasal washings and aspirates are unacceptable for Xpert Xpress SARS-CoV-2/FLU/RSV testing.  Fact Sheet for Patients: BloggerCourse.com  Fact Sheet for Healthcare Providers: SeriousBroker.it  This test is not yet approved or cleared by the [place] FDA and has been authorized for detection and/or diagnosis of SARS-CoV-2 by FDA under an Emergency Use Authorization (EUA). This EUA will remain in effect (meaning this test can be used) for the duration of the COVID-19 declaration under Section 564(b)(1) of the Act, 21 U.S.C. section 360bbb-3(b)(1), unless the authorization is terminated or revoked.     Resp Syncytial Virus by PCR NEGATIVE NEGATIVE Final    Comment: (NOTE) Fact Sheet for Patients: BloggerCourse.com  Fact Sheet for Healthcare Providers: SeriousBroker.it  This test is not yet approved or cleared by the [place] FDA and has been authorized for detection and/or diagnosis of SARS-CoV-2 by FDA under an Emergency Use Authorization (EUA). This EUA will remain in effect (meaning this test can be used) for the duration of the COVID-19 declaration under Section 564(b)(1) of the Act, 21 U.S.C. section 360bbb-3(b)(1), unless the authorization is terminated or revoked.  Performed at [facility], [address] [place], [place] [address]   Blood Culture (routine x 2)     Status: None (Preliminary result)   Collection  Time: 08/18/23  7:09 PM   Specimen: BLOOD RIGHT ARM  Result Value Ref Range Status   Specimen Description   Final    BLOOD RIGHT ARM Performed at [facility], [address] [place], [place] [address]    Special Requests   Final    BOTTLES DRAWN AEROBIC AND ANAEROBIC Blood Culture adequate volume Performed at [facility], [address] [place], [place] [address]    Culture  Setup Time   Final    GRAM POSITIVE COCCI IN CHAINS IN BOTH AEROBIC AND ANAEROBIC BOTTLES Gram Stain Report Called to,Read Back By and Verified With: BLACKWELL,S. ON 08/19/2023  AT 0929 BY FRATTO,A. CRITICAL RESULT CALLED TO, READ BACK BY AND VERIFIED WITH: Raynaldo Opitz [id number] AT 1412 BY CM Performed at [facility], [address], [place], [place] [address]    Culture GRAM POSITIVE COCCI  Final   Report Status PENDING  Incomplete  Blood Culture (routine x 2)     Status: None (Preliminary result)   Collection Time: 08/18/23  7:09 PM   Specimen: BLOOD LEFT ARM  Result Value Ref Range Status   Specimen Description   Final    BLOOD LEFT ARM Performed at [facility], [address] [place], [place] [address]    Special Requests   Final    BOTTLES DRAWN AEROBIC AND ANAEROBIC Blood Culture adequate volume Performed at [facility], [address] [place], [place] [address]    Culture  Setup Time   Final    GRAM POSITIVE COCCI IN CHAINS IN BOTH AEROBIC AND ANAEROBIC BOTTLES Gram Stain Report Called to,Read Back By and Verified With: BLACKWELL,S. ON 08/19/2023 AT 0929 BY FRATTO,A. CRITICAL VALUE NOTED.  VALUE IS CONSISTENT WITH PREVIOUSLY REPORTED AND CALLED VALUE. Performed at [facility], [address] [place], [place] [address]    Culture GRAM POSITIVE COCCI  Final   Report Status PENDING  Incomplete  Blood Culture ID Panel (Reflexed)     Status: Abnormal   Collection Time: 08/18/23  7:09 PM  Result Value Ref Range Status   Enterococcus faecalis DETECTED (A) NOT DETECTED  Final    Comment: CRITICAL RESULT CALLED TO, READ BACK BY AND VERIFIED WITH: PHARMD S HURTH [id number] AT 1412 BY CM    Enterococcus Faecium NOT DETECTED NOT DETECTED Final   Listeria monocytogenes NOT DETECTED NOT DETECTED Final   Staphylococcus species NOT DETECTED NOT DETECTED Final   Staphylococcus aureus (BCID) NOT DETECTED NOT DETECTED Final   Staphylococcus epidermidis NOT DETECTED NOT DETECTED Final   Staphylococcus lugdunensis NOT DETECTED NOT DETECTED Final   Streptococcus species NOT DETECTED NOT DETECTED Final   Streptococcus agalactiae NOT DETECTED NOT DETECTED Final   Streptococcus pneumoniae NOT DETECTED NOT DETECTED Final   Streptococcus pyogenes NOT DETECTED NOT DETECTED Final   A.calcoaceticus-baumannii NOT DETECTED NOT DETECTED Final   Bacteroides fragilis NOT DETECTED NOT DETECTED Final   Enterobacterales NOT DETECTED NOT DETECTED Final   Enterobacter cloacae complex NOT DETECTED NOT DETECTED Final   Escherichia coli NOT DETECTED NOT DETECTED Final   Klebsiella aerogenes NOT DETECTED NOT DETECTED Final   Klebsiella oxytoca NOT DETECTED NOT DETECTED Final   Klebsiella pneumoniae NOT DETECTED NOT DETECTED Final   Proteus species NOT DETECTED NOT DETECTED Final   Salmonella species NOT DETECTED NOT DETECTED Final   Serratia marcescens NOT DETECTED NOT DETECTED Final   Haemophilus influenzae NOT DETECTED NOT DETECTED Final   Neisseria meningitidis NOT DETECTED NOT DETECTED Final   Pseudomonas aeruginosa NOT DETECTED NOT DETECTED Final   Stenotrophomonas maltophilia NOT DETECTED NOT DETECTED Final   Candida albicans NOT DETECTED NOT DETECTED Final   Candida auris NOT DETECTED NOT DETECTED Final   Candida glabrata NOT DETECTED NOT DETECTED Final   Candida krusei NOT DETECTED NOT DETECTED Final   Candida parapsilosis NOT DETECTED NOT DETECTED Final   Candida tropicalis NOT DETECTED NOT DETECTED Final   Cryptococcus neoformans/gattii NOT DETECTED NOT DETECTED Final    Vancomycin resistance NOT DETECTED NOT DETECTED Final    Comment: Performed at [facility], [address], [place], [place] [address]  Culture, blood (Routine X 2) w Reflex to ID Panel     Status: None (Preliminary result)   Collection Time: 08/19/23 12:18 PM   Specimen: BLOOD  Result Value Ref Range Status   Specimen Description BLOOD LW  Final   Special Requests   Final    BOTTLES DRAWN AEROBIC AND ANAEROBIC Blood Culture adequate volume   Culture   Final    NO GROWTH < 24 HOURS Performed at [facility], [address], [place], [place] [address]    Report Status PENDING  Incomplete  Culture, blood (Routine X 2) w Reflex to ID Panel     Status: None (Preliminary result)   Collection Time: 08/19/23 12:18 PM   Specimen: BLOOD  Result Value Ref Range Status   Specimen Description BLOOD BLOOD RIGHT HAND  Final   Special Requests   Final    BOTTLES DRAWN AEROBIC AND ANAEROBIC Blood Culture adequate volume   Culture   Final    NO GROWTH < 24 HOURS Performed at [facility], [address], [place], [place] [address]    Report Status PENDING  Incomplete     Radiology Studies: DG Chest Port 1 View  Result Date: 08/18/2023 CLINICAL DATA:  Questionable sepsis - evaluate for abnormality. 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 Prior CABG. Heart and mediastinal contours within normal limits. Aortic atherosclerosis. No confluent opacities or effusions. No acute bony abnormality. IMPRESSION: No active disease. Electronically Signed   By: Charlett Nose M.D.   On: 08/18/2023 21:23    Scheduled Meds:  atorvastatin  40 mg Oral Daily   cinacalcet  90 mg Oral Daily   enoxaparin (LOVENOX) injection  70 mg Subcutaneous Q24H   ezetimibe  10 mg Oral Daily   gabapentin  800 mg Oral Daily   latanoprost  1 drop Both Eyes QHS   multivitamin  1 tablet Oral Daily   oxyCODONE-acetaminophen  1 tablet Oral TID   And    oxyCODONE  2.5 mg Oral TID   pneumococcal 20-valent conjugate vaccine  0.5 mL Intramuscular Tomorrow-1000   traZODone  50 mg Oral QHS   Continuous Infusions:  ampicillin (OMNIPEN) IV 2 g (08/20/23 0559)   cefTRIAXone (ROCEPHIN)  IV 2 g (08/19/23 2037)   lactated ringers 50 mL/hr at 08/19/23 2049     LOS: 2 days   Burnadette Pop, MD [facility] P9/04/2023, 7:50 AM

## 2023-08-20 NOTE — Evaluation (Signed)
Physical Therapy Evaluation Patient Details Name: Kevin Collins MRN: [id number] DOB: 05-07-1930 Today's Date: 08/20/2023  History of Present Illness  89 y.o. male admitted 9/3 with fever, weakness, bacteremia. PMhx: HFrEF, AFib s/p PPM for CRT, CAD s/p CABG, HTN, dyslipidemia, peripheral neuropathy, cellulitis of left 5th toe  Clinical Impression  Pt pleasant and HOH with sons present to confirm assist at home and pt normally independent and driving. Pt with some balance deficits noted with gait and encouraged use of cane at present and encouraged pt and family to install grab bar on stairs as pt has fallen on then in the last year. Pt overall moving well and able to return home with encouragement for daily ambulation. Pt with decreased balance and gait who will benefit from acute therapy to maximize mobility and safety.         If plan is discharge home, recommend the following: Help with stairs or ramp for entrance   Can travel by private vehicle        Equipment Recommendations None recommended by PT  Recommendations for Other Services       Functional Status Assessment Patient has had a recent decline in their functional status and demonstrates the ability to make significant improvements in function in a reasonable and predictable amount of time.     Precautions / Restrictions Precautions Precautions: Fall      Mobility  Bed Mobility Overal bed mobility: Modified Independent             General bed mobility comments: HOB flat    Transfers Overall transfer level: Modified independent                      Ambulation/Gait Ambulation/Gait assistance: Contact guard assist Gait Distance (Feet): 450 Feet Assistive device: None Gait Pattern/deviations: Step-through pattern, Decreased stride length   Gait velocity interpretation: >2.62 ft/sec, indicative of community ambulatory   General Gait Details: 2 partial LOB with tactile cues to correct without use of  AD  Stairs Stairs: Yes Stairs assistance: Supervision Stair Management: Alternating pattern, One rail Right Number of Stairs: 2 General stair comments: reliance on rail with supervision for lines and safety  Wheelchair Mobility     Tilt Bed    Modified Rankin (Stroke Patients Only)       Balance Overall balance assessment: Needs assistance   Sitting balance-Leahy Scale: Good       Standing balance-Leahy Scale: Good                               Pertinent Vitals/Pain Pain Assessment Pain Assessment: No/denies pain    Home Living Family/patient expects to be discharged to:: Private residence Living Arrangements: Children Available Help at Discharge: Family;Available 24 hours/day Type of Home: House Home Access: Stairs to enter   [employer] of Steps: 2   Home Layout: One level Home Equipment: Crutches;Cane - single point      Prior Function Prior Level of Function : Independent/Modified Independent;Driving;History of Falls (last six months)             Mobility Comments: normally walks independently, 2-3 falls in the last year, walks 1/4 mile daily ADLs Comments: drives to meet friends and completes ADLs, son cooks     Extremity/Trunk Assessment   Upper Extremity Assessment Upper Extremity Assessment: Generalized weakness    Lower Extremity Assessment Lower Extremity Assessment: Generalized weakness    Cervical / Trunk Assessment  Cervical / Trunk Assessment: Normal  Communication   Communication Communication: Hearing impairment  Cognition Arousal: Alert Behavior During Therapy: WFL for tasks assessed/performed Overall Cognitive Status: Within Functional Limits for tasks assessed                                          General Comments      Exercises     Assessment/Plan    PT Assessment Patient needs continued PT services  PT Problem List Decreased activity tolerance;Decreased balance        PT Treatment Interventions Gait training;DME instruction;Functional mobility training;Patient/family education;Therapeutic activities;Balance training;Therapeutic exercise    PT Goals (Current goals can be found in the Care Plan section)  Acute Rehab PT Goals Patient Stated Goal: return home PT Goal Formulation: With patient/family Time For Goal Achievement: 09/03/23 Potential to Achieve Goals: Good    Frequency Min 1X/week     Co-evaluation               AM-PAC PT "6 Clicks" Mobility  Outcome Measure Help needed turning from your back to your side while in a flat bed without using bedrails?: None Help needed moving from lying on your back to sitting on the side of a flat bed without using bedrails?: None Help needed moving to and from a bed to a chair (including a wheelchair)?: None Help needed standing up from a chair using your arms (e.g., wheelchair or bedside chair)?: None Help needed to walk in hospital room?: A Little Help needed climbing 3-5 steps with a railing? : A Little 6 Click Score: 22    End of Session   Activity Tolerance: Patient tolerated treatment well Patient left: in chair;with call bell/phone within reach;with family/visitor present;with nursing/sitter in room Nurse Communication: Mobility status PT Visit Diagnosis: Other abnormalities of gait and mobility (R26.89);History of falling (Z91.81)    Time: 1610-9604 PT Time Calculation (min) (ACUTE ONLY): 21 min   Charges:   PT Evaluation $PT Eval Low Complexity: 1 Low   PT General Charges $$ ACUTE PT VISIT: 1 Visit         Merryl Hacker, PT Acute Rehabilitation Services Office: [phone]   Enedina Finner Haider Hornaday 08/20/2023, 10:32 AM

## 2023-08-20 NOTE — Progress Notes (Signed)
  2D Echocardiogram has been performed.  Leda Roys RDCS 08/20/2023, 2:43 PM

## 2023-08-21 ENCOUNTER — Encounter (HOSPITAL_COMMUNITY)
Admission: EM | Disposition: A | Discharge status: Home Health | Payer: Self-pay | Source: Home / Self Care | Attending: Internal Medicine

## 2023-08-21 ENCOUNTER — Other Ambulatory Visit (HOSPITAL_COMMUNITY): Payer: Medicare Other

## 2023-08-21 DIAGNOSIS — M79672 Pain in left foot: Secondary | ICD-10-CM | POA: Diagnosis not present

## 2023-08-21 DIAGNOSIS — Z66 Do not resuscitate: Secondary | ICD-10-CM

## 2023-08-21 DIAGNOSIS — Z7189 Other specified counseling: Secondary | ICD-10-CM | POA: Diagnosis not present

## 2023-08-21 DIAGNOSIS — R7881 Bacteremia: Secondary | ICD-10-CM | POA: Diagnosis not present

## 2023-08-21 DIAGNOSIS — A419 Sepsis, unspecified organism: Secondary | ICD-10-CM | POA: Diagnosis not present

## 2023-08-21 DIAGNOSIS — Z515 Encounter for palliative care: Secondary | ICD-10-CM

## 2023-08-21 DIAGNOSIS — B952 Enterococcus as the cause of diseases classified elsewhere: Secondary | ICD-10-CM | POA: Diagnosis not present

## 2023-08-21 LAB — BASIC METABOLIC PANEL
Anion gap: 14 (ref 5–15)
BUN: 26 mg/dL — ABNORMAL HIGH (ref 8–23)
CO2: 19 mmol/L — ABNORMAL LOW (ref 22–32)
Calcium: 7.3 mg/dL — ABNORMAL LOW (ref 8.9–10.3)
Chloride: 102 mmol/L (ref 98–111)
Creatinine, Ser: 1.35 mg/dL — ABNORMAL HIGH (ref 0.61–1.24)
GFR, Estimated: 50 mL/min — ABNORMAL LOW (ref >60)
Glucose, Bld: 88 mg/dL (ref 70–99)
Potassium: 3.5 mmol/L (ref 3.5–5.1)
Sodium: 135 mmol/L (ref 135–145)

## 2023-08-21 LAB — GASTROINTESTINAL PANEL BY PCR, STOOL (REPLACES STOOL CULTURE)

## 2023-08-21 LAB — CULTURE, BLOOD (ROUTINE X 2)
Special Requests: ADEQUATE
Special Requests: ADEQUATE

## 2023-08-21 SURGERY — ECHOCARDIOGRAM, TRANSESOPHAGEAL
Anesthesia: Monitor Anesthesia Care

## 2023-08-21 MED ORDER — LOPERAMIDE HCL 2 MG PO CAPS
2.0000 mg | ORAL_CAPSULE | Freq: Four times a day (QID) | ORAL | Status: DC | PRN
  Administered 2023-08-21 (×2): 2 mg via ORAL
  Filled 2023-08-21 (×2): qty 1

## 2023-08-21 NOTE — Consult Note (Signed)
Cardiology Consultation   Patient ID: Kevin Collins MRN: [id number]; DOB: 05-10-1930  Admit date: 08/18/2023 Date of Consult: 08/21/2023  PCP:  Toma Deiters, MD   [facility] Providers Cardiologist:  Rollene Rotunda, MD  Electrophysiologist:  Lanier Prude, MD  {    Patient Profile:   Kevin Collins is a 89 y.o. male with a hx of CAD (CABG 2000), HTN, HLD, permanent AFib, symptomatic bradycardia, ICM > CRT-P (RV/LV leads),  who is being seen 08/21/2023 for the evaluation of PPM in the environment of bacteremia at the request of Dr. Damian Leavell.  History of Present Illness:   [name] was admitted to [facility] 08/18/23 with sepsis, c/o progressive weakness, chills, noted hx of recent treatment for cellulitis /L foot wound, finishing his ABX tx the day prior. He was FEBRILE to 103 on arrival, WBC 13.6  Blood cultures drawn on admission have grown GPCs in 4 of 4 collections. Planned for transfer to [facility] for further evaluation, ID/EP evaluations and perhaps TEE/MRI  ID saw him last evening once here, E faecalis bacteremia > EP eval and TEE recommended There are reports of diarrhea/frequent stools days leading into hospital stay  ortho consulted to eval his foot >> no need for surgical intervention, no need for abx for the foot  Device information  [employer] Quadra Allura CRT-P implanted 01/24/22 RV LPA 1200M Tendril LV 1458Q Quartet  Implant indication symptomatic bradycardia  LABS (08/20/23) K+ 3.6 BUN/Creat 29/1.50 Lactic acid 0.9 CRP 3.3 ProCal 0.24 WBC 13.6 > 13 > 9.3 H/H 8.8/27 Plts 114  Pt denies CP, palpitations, SOB, feeling better   Past Medical History:  Diagnosis Date   Bradycardia, unspecified    CAD (coronary artery disease)    NATIVE WITHOUT ANGINA   Hereditary and idiopathic neuropathy, unspecified    History of shingles    Hyperlipidemia    Hypertension    ESSENTIAL PRIMARY   Peripheral neuropathy     Past Surgical History:  Procedure Laterality Date    BIV PACEMAKER INSERTION CRT-P N/A 01/24/2022   Procedure: BIV PACEMAKER INSERTION CRT-P;  Surgeon: Lanier Prude, MD;  Location: [facility] INVASIVE CV LAB;  Service: Cardiovascular;  Laterality: N/A;   CARDIAC CATHETERIZATION Left 09/1999   NORMAL LEFT MAIN, OCCLUDED LAD, OCCLUDED MID CFX, OCCLUDED PROXIMAL RCA, 60% STENOSIS PROXIMAL DIAG 1, RIGHT TO LEFT COLLATERAL, LEFT TO RIGHT COLLATERAL; LVEF OF 48% DOCUMENTED VIA NUCLEAR STUDY ON 07/12/2009.   CARDIOVERSION N/A 10/05/2019   Procedure: CARDIOVERSION;  Surgeon: Thurmon Fair, MD;  Location: [facility] ENDOSCOPY;  Service: Cardiovascular;  Laterality: N/A;   CATARACT EXTRACTION     CORONARY ARTERY BYPASS GRAFT     w/ LIMA TO LAD, SVG TO dx, SVG TO OM, SVG TO AM-PD-PL 10/12/99 [facility]DRICKSON   REMOVAL OF ANKLE PLATE       Home Medications:  Prior to Admission medications   Medication Sig Start Date End Date Taking? Authorizing Provider  bimatoprost (LUMIGAN) 0.01 % SOLN Place 1 drop into both eyes at bedtime.   Yes [provider]  cinacalcet (SENSIPAR) 90 MG tablet Take 90 mg by mouth daily. 10/31/20  Yes [provider]  ELIQUIS 2.5 MG TABS tablet TAKE 1 TABLET TWICE A DAY 12/02/22  Yes Hochrein, Fayrene Fearing, MD  ezetimibe (ZETIA) 10 MG tablet TAKE 1 TABLET DAILY (NEED APPOINTMENT FOR FUTURE REFILLS) 08/10/23  Yes Rollene Rotunda, MD  furosemide (LASIX) 20 MG tablet Take 1 tablet (20 mg total) by mouth daily. 07/23/22  Yes Rollene Rotunda,  MD  gabapentin (NEURONTIN) 800 MG tablet Take 800 mg by mouth 3 (three) times daily.   Yes [provider]  KRILL OIL PO Take 1 capsule by mouth daily.   Yes [provider]  lisinopril (ZESTRIL) 20 MG tablet TAKE 1 TABLET DAILY (NEED APPOINTMENT FOR REFILLS) 05/26/22  Yes Rollene Rotunda, MD  Multiple Vitamins-Minerals (PRESERVISION AREDS) CAPS Take 1 capsule by mouth 2 (two) times daily.   Yes [provider]  oxyCODONE-acetaminophen (PERCOCET) 7.5-325 MG tablet Take 1 tablet  by mouth 3 (three) times daily.   Yes [provider]  simvastatin (ZOCOR) 80 MG tablet TAKE 1 TABLET DAILY 08/11/23  Yes Rollene Rotunda, MD  spironolactone (ALDACTONE) 25 MG tablet TAKE 1 TABLET DAILY 06/26/23  Yes Rollene Rotunda, MD    Inpatient Medications: Scheduled Meds:  atorvastatin  40 mg Oral Daily   cinacalcet  90 mg Oral Daily   enoxaparin (LOVENOX) injection  70 mg Subcutaneous Q24H   ezetimibe  10 mg Oral Daily   gabapentin  800 mg Oral Daily   latanoprost  1 drop Both Eyes QHS   melatonin  3 mg Oral QHS   multivitamin  1 tablet Oral Daily   oxyCODONE-acetaminophen  1 tablet Oral TID   And   oxyCODONE  2.5 mg Oral TID   pneumococcal 20-valent conjugate vaccine  0.5 mL Intramuscular Tomorrow-1000   traZODone  50 mg Oral QHS   Continuous Infusions:  ampicillin (OMNIPEN) IV 2 g (08/21/23 0600)   cefTRIAXone (ROCEPHIN)  IV 2 g (08/20/23 2122)   lactated ringers 50 mL/hr at 08/20/23 1617   PRN Meds: acetaminophen **OR** acetaminophen, loperamide, magnesium hydroxide, ondansetron **OR** ondansetron (ZOFRAN) IV  Allergies:   No Known Allergies  Social History:   Social History   Socioeconomic History   Marital status: Divorced    Spouse name: Not on file   Number of children: 1   Years of education: Not on file   Highest education level: Not on file  Occupational History   Occupation: RETIRED  Tobacco Use   Smoking status: Former   Smokeless tobacco: Never  Advertising account planner   Vaping status: Never Used  Substance and Sexual Activity   Alcohol use: Never   Drug use: Never   Sexual activity: Not on file  Other Topics Concern   Not on file  Social History Narrative   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     Social Determinants of Health   Financial Resource Strain: Not on file  Food Insecurity: No Food Insecurity (08/18/2023)   Hunger Vital Sign    Worried About Running Out of Food in the Last Year: Never true    Ran Out of Food in the Last  Year: Never true  Transportation Needs: No Transportation Needs (08/18/2023)   PRAPARE - Administrator, Civil Service (Medical): No    Lack of Transportation (Non-Medical): No  Physical Activity: Not on file  Stress: Not on file  Social Connections: Not on file  Intimate Partner Violence: Not At Risk (08/18/2023)   Humiliation, Afraid, Rape, and Kick questionnaire    Fear of Current or Ex-Partner: No    Emotionally Abused: No    Physically Abused: No    Sexually Abused: No    Family History:   Family History  Problem Relation Age of Onset   Other Mother        MALIGNANT TUMOR OF OVARY   CVA Father  ROS:  Please see the history of present illness.  All other ROS reviewed and negative.     Physical Exam/Data:   Vitals:   08/20/23 2049 08/21/23 0529 08/21/23 0659 08/21/23 0748  BP: (!) 117/58 117/64  117/63  Pulse: 60 62  64  Resp: 18 18  16   Temp: (!) 97.5 F (36.4 C) 97.6 F (36.4 C)  (!) 97.4 F (36.3 C)  TempSrc: Oral Oral  Oral  SpO2: 95% 93%  93%  Weight:   74.3 kg   Height:        Intake/Output Summary (Last 24 hours) at 08/21/2023 0834 Last data filed at 08/20/2023 1506 Gross per 24 hour  Intake 1673.76 ml  Output --  Net 1673.76 ml      08/21/2023    6:59 AM 08/20/2023    6:43 AM 08/18/2023   11:26 PM  Last 3 Weights  Weight (lbs) 163 lb 12.8 oz 163 lb 12.8 oz 160 lb 8 oz  Weight (kg) 74.3 kg 74.3 kg 72.802 kg     Body mass index is 23.5 kg/m.  General:  Well nourished, well developed, in no acute distress HEENT: normal Neck: no JVD Vascular: No carotid bruits Cardiac:  RRR; (paced), no murmurs, gallops or rubs Lungs: CTA b/l, no wheezing, rhonchi or rales  Abd: soft, nontender Ext: no edema Musculoskeletal:  No deformities Skin: warm and dry  Neuro:  no focal abnormalities noted Psych:  Normal affect   EKG:  The EKG was personally reviewed and demonstrates:    AFib/V paced @ 60   Telemetry:  not on tele     Relevant CV  Studies:  08/20/23: TTE 1. Left ventricular ejection fraction, by estimation, is 50 to 55%. The  left ventricle has low normal function. The left ventricle has no regional  wall motion abnormalities. The left ventricular internal cavity size was  mildly dilated. Left ventricular  diastolic parameters are indeterminate.   2. Right ventricular systolic function is normal. The right ventricular  size is mildly enlarged. There is moderately elevated pulmonary artery  systolic pressure.   3. Left atrial size was severely dilated.   4. Right atrial size was severely dilated.   5. The mitral valve is normal in structure. Mild mitral valve  regurgitation. No evidence of mitral stenosis.   6. The aortic valve is tricuspid. Aortic valve regurgitation is mild. No  aortic stenosis is present.   7. The inferior vena cava is dilated in size with <50% respiratory  variability, suggesting right atrial pressure of 15 mmHg.    Laboratory Data:  High Sensitivity Troponin:  No results for input(s): "TROPONINIHS" in the last 720 hours.   Chemistry Recent Labs  Lab 08/18/23 1858 08/19/23 0417 08/20/23 0948  NA 130* 133* 132*  K 4.4 4.0 3.6  CL 98 98 101  CO2 23 22 19*  GLUCOSE 108* 103* 101*  BUN 36* 34* 29*  CREATININE 1.95* 1.66* 1.50*  CALCIUM 7.3* 7.3* 7.2*  GFRNONAA 32* 39* 44*  ANIONGAP 9 13 12     Recent Labs  Lab 08/18/23 1858  PROT 6.3*  ALBUMIN 3.4*  AST 15  ALT 16  ALKPHOS 46  BILITOT 0.9   Lipids No results for input(s): "CHOL", "TRIG", "HDL", "LABVLDL", "LDLCALC", "CHOLHDL" in the last 168 hours.  Hematology Recent Labs  Lab 08/18/23 1858 08/19/23 0417 08/20/23 0948  WBC 13.6* 13.0* 9.3  RBC 3.03* 2.66* 2.91*  HGB 9.2* 8.1* 8.8*  HCT 28.4* 25.3* 27.7*  MCV 93.7 95.1 95.2  MCH 30.4 30.5 30.2  MCHC 32.4 32.0 31.8  RDW 12.5 12.5 12.1  PLT 148* 127* 114*   Thyroid No results for input(s): "TSH", "FREET4" in the last 168 hours.  BNPNo results for input(s): "BNP",  "PROBNP" in the last 168 hours.  DDimer No results for input(s): "DDIMER" in the last 168 hours.   Radiology/Studies:   DG Foot Complete Left Result Date: 08/20/2023 CLINICAL DATA:  Osteomyelitis EXAM: LEFT FOOT - COMPLETE 3+ VIEW COMPARISON:  None Available. FINDINGS: There is fragmentation of the distal phalanx of the fourth toe. There is no other evidence of acute osseous abnormality in the foot. There is no other osseous erosion or destruction. Bony alignment is normal. There is mild degenerative change about the midfoot and mild inferior calcaneal spurring. There is no soft tissue gas or radiopaque foreign body. IMPRESSION: Fragmentation of the fourth distal phalanx may reflect fracture, osteomyelitis, or combination. Correlate with physical exam and consider MRI as indicated. Electronically Signed   By: Lesia Hausen M.D.   On: 08/20/2023 13:31   DG Chest Port 1 View Result Date: 08/18/2023 CLINICAL DATA:  Questionable sepsis - evaluate for abnormality. 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 Prior CABG. Heart and mediastinal contours within normal limits. Aortic atherosclerosis. No confluent opacities or effusions. No acute bony abnormality. IMPRESSION: No active disease. Electronically Signed   By: Charlett Nose M.D.   On: 08/18/2023 21:23     Assessment and Plan:   Bacteremia  E faecalis bacteremia   Pt is clear about his DNR status Dr. Lalla Brothers discussed with him concerns about device extraction and potential complications  At his age and given co morbidities > suppressive antibiotics is recommended The patient's son is in route to the hospital and we will circle back around later this morning to discuss further  For Now, I have asked cath lab not to call (but not CANCEL yet) his TEE until we have discussed further with the patient/and his son Keep NPO until final decision/discussion   Risk  Assessment/Risk Scores:    For questions or updates, please contact [facility] Please consult [email or web address] for contact info under    Signed, Sheilah Pigeon, PA-C  08/21/2023 8:34 AM]

## 2023-08-21 NOTE — Plan of Care (Signed)

## 2023-08-21 NOTE — Consult Note (Signed)
ORTHOPAEDIC CONSULTATION  REQUESTING PHYSICIAN: Burnadette Pop, MD  Chief Complaint: Patient complains of pain all over including both feet.  HPI: Kevin Collins is a 89 y.o. male who presents with ulceration beneath the fifth metatarsal head left foot.  Patient has swelling of the fourth toe chronically.  Past Medical History:  Diagnosis Date   Bradycardia, unspecified    CAD (coronary artery disease)    NATIVE WITHOUT ANGINA   Hereditary and idiopathic neuropathy, unspecified    History of shingles    Hyperlipidemia    Hypertension    ESSENTIAL PRIMARY   Peripheral neuropathy    Past Surgical History:  Procedure Laterality Date   BIV PACEMAKER INSERTION CRT-P N/A 01/24/2022   Procedure: BIV PACEMAKER INSERTION CRT-P;  Surgeon: Lanier Prude, MD;  Location: [facility] INVASIVE CV LAB;  Service: Cardiovascular;  Laterality: N/A;   CARDIAC CATHETERIZATION Left 09/1999   NORMAL LEFT MAIN, OCCLUDED LAD, OCCLUDED MID CFX, OCCLUDED PROXIMAL RCA, 60% STENOSIS PROXIMAL DIAG 1, RIGHT TO LEFT COLLATERAL, LEFT TO RIGHT COLLATERAL; LVEF OF 48% DOCUMENTED VIA NUCLEAR STUDY ON 07/12/2009.   CARDIOVERSION N/A 10/05/2019   Procedure: CARDIOVERSION;  Surgeon: Thurmon Fair, MD;  Location: [facility] ENDOSCOPY;  Service: Cardiovascular;  Laterality: N/A;   CATARACT EXTRACTION     CORONARY ARTERY BYPASS GRAFT     w/ LIMA TO LAD, SVG TO dx, SVG TO OM, SVG TO AM-PD-PL 10/12/99 [facility]ICKSON   REMOVAL OF ANKLE PLATE     Social History   Socioeconomic History   Marital status: Divorced    Spouse name: Not on file   Number of children: 1   Years of education: Not on file   Highest education level: Not on file  Occupational History   Occupation: RETIRED  Tobacco Use   Smoking status: Former   Smokeless tobacco: Never  Vaping Use   Vaping status: Never Used  Substance and Sexual Activity   Alcohol use: Never   Drug use: Never   Sexual activity: Not on file  Other Topics Concern   Not on file   Social History Narrative   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     Social Determinants of Health   Financial Resource Strain: Not on file  Food Insecurity: No Food Insecurity (08/18/2023)   Hunger Vital Sign    Worried About Running Out of Food in the Last Year: Never true    Ran Out of Food in the Last Year: Never true  Transportation Needs: No Transportation Needs (08/18/2023)   PRAPARE - Administrator, Civil Service (Medical): No    Lack of Transportation (Non-Medical): No  Physical Activity: Not on file  Stress: Not on file  Social Connections: Not on file   Family History  Problem Relation Age of Onset   Other Mother        MALIGNANT TUMOR OF OVARY   CVA Father    - negative except otherwise stated in the family history section No Known Allergies Prior to Admission medications   Medication Sig Start Date End Date Taking? Authorizing Provider  bimatoprost (LUMIGAN) 0.01 % SOLN Place 1 drop into both eyes at bedtime.   Yes [provider]  cinacalcet (SENSIPAR) 90 MG tablet Take 90 mg by mouth daily. 10/31/20  Yes [provider]  ELIQUIS 2.5 MG TABS tablet TAKE 1 TABLET TWICE A DAY 12/02/22  Yes Rollene Rotunda, MD  ezetimibe (ZETIA) 10 MG tablet TAKE 1 TABLET DAILY (NEED APPOINTMENT  FOR FUTURE REFILLS) 08/10/23  Yes Rollene Rotunda, MD  furosemide (LASIX) 20 MG tablet Take 1 tablet (20 mg total) by mouth daily. 07/23/22  Yes Rollene Rotunda, MD  gabapentin (NEURONTIN) 800 MG tablet Take 800 mg by mouth 3 (three) times daily.   Yes [provider]  KRILL OIL PO Take 1 capsule by mouth daily.   Yes [provider]  lisinopril (ZESTRIL) 20 MG tablet TAKE 1 TABLET DAILY (NEED APPOINTMENT FOR REFILLS) 05/26/22  Yes Rollene Rotunda, MD  Multiple Vitamins-Minerals (PRESERVISION AREDS) CAPS Take 1 capsule by mouth 2 (two) times daily.   Yes [provider]  oxyCODONE-acetaminophen (PERCOCET) 7.5-325 MG tablet Take 1  tablet by mouth 3 (three) times daily.   Yes [provider]  simvastatin (ZOCOR) 80 MG tablet TAKE 1 TABLET DAILY 08/11/23  Yes Rollene Rotunda, MD  spironolactone (ALDACTONE) 25 MG tablet TAKE 1 TABLET DAILY 06/26/23  Yes Rollene Rotunda, MD   ECHOCARDIOGRAM COMPLETE  Result Date: 08/20/2023    ECHOCARDIOGRAM REPORT   Patient Name:   Kevin Collins Date of Exam: 08/20/2023 Medical Rec #:  [id number]   Height:       70.0 in Accession #:    [id number]  Weight:       163.8 lb Date of Birth:  Jun 12, 1930   BSA:          1.918 m Patient Age:    89 years    BP:           113/54 mmHg Patient Gender: M           HR:           63 bpm. Exam Location:  Inpatient Procedure: 2D Echo, Cardiac Doppler, Color Doppler and Intracardiac            Opacification Agent Indications:    Bacteremia R78.81  History:        Patient has prior history of Echocardiogram examinations, most                 recent 01/14/2022. CAD; Risk Factors:Dyslipidemia and                 Hypertension.  Sonographer:    Harriette Bouillon RDCS Referring Phys: [id number] [facility] IMPRESSIONS  1. Left ventricular ejection fraction, by estimation, is 50 to 55%. The left ventricle has low normal function. The left ventricle has no regional wall motion abnormalities. The left ventricular internal cavity size was mildly dilated. Left ventricular diastolic parameters are indeterminate.  2. Right ventricular systolic function is normal. The right ventricular size is mildly enlarged. There is moderately elevated pulmonary artery systolic pressure.  3. Left atrial size was severely dilated.  4. Right atrial size was severely dilated.  5. The mitral valve is normal in structure. Mild mitral valve regurgitation. No evidence of mitral stenosis.  6. The aortic valve is tricuspid. Aortic valve regurgitation is mild. No aortic stenosis is present.  7. The inferior vena cava is dilated in size with <50% respiratory variability, suggesting right atrial pressure of 15 mmHg.  FINDINGS  Left Ventricle: Left ventricular ejection fraction, by estimation, is 50 to 55%. The left ventricle has low normal function. The left ventricle has no regional wall motion abnormalities. Definity contrast agent was given IV to delineate the left ventricular endocardial borders. The left ventricular internal cavity size was mildly dilated. There is no left ventricular hypertrophy. Left ventricular diastolic parameters are indeterminate. Right Ventricle: The right ventricular size is mildly  enlarged. Right ventricular systolic function is normal. There is moderately elevated pulmonary artery systolic pressure. The tricuspid regurgitant velocity is 2.88 m/s, and with an assumed right atrial pressure of 15 mmHg, the estimated right ventricular systolic pressure is 48.2 mmHg. Left Atrium: Left atrial size was severely dilated. Right Atrium: Right atrial size was severely dilated. Pericardium: There is no evidence of pericardial effusion. Mitral Valve: The mitral valve is normal in structure. Mild mitral valve regurgitation. No evidence of mitral valve stenosis. Tricuspid Valve: The tricuspid valve is normal in structure. Tricuspid valve regurgitation is mild . No evidence of tricuspid stenosis. Aortic Valve: The aortic valve is tricuspid. Aortic valve regurgitation is mild. No aortic stenosis is present. Pulmonic Valve: The pulmonic valve was normal in structure. Pulmonic valve regurgitation is trivial. No evidence of pulmonic stenosis. Aorta: The aortic root is normal in size and structure. Venous: The inferior vena cava is dilated in size with less than 50% respiratory variability, suggesting right atrial pressure of 15 mmHg. IAS/Shunts: No atrial level shunt detected by color flow Doppler. Additional Comments: A device lead is visualized.  LEFT VENTRICLE PLAX 2D LVIDd:         6.00 cm   Diastology LVIDs:         4.30 cm   LV e' medial:    9.25 cm/s LV PW:         0.70 cm   LV E/e' medial:  11.6 LV IVS:         0.70 cm   LV e' lateral:   6.53 cm/s LVOT diam:     2.00 cm   LV E/e' lateral: 16.4 LV SV:         64 LV SV Index:   34 LVOT Area:     3.14 cm  RIGHT VENTRICLE            IVC RV S prime:     7.72 cm/s  IVC diam: 2.60 cm TAPSE (M-mode): 1.8 cm LEFT ATRIUM            Index        RIGHT ATRIUM           Index LA diam:      4.90 cm  2.56 cm/m   RA Area:     26.00 cm LA Vol (A4C): 103.0 ml 53.72 ml/m  RA Volume:   82.60 ml  43.08 ml/m  AORTIC VALVE             PULMONIC VALVE LVOT Vmax:   112.00 cm/s PV Vmax:       0.83 m/s LVOT Vmean:  78.300 cm/s PV Peak grad:  2.7 mmHg LVOT VTI:    0.205 m  AORTA Ao Root diam: 3.70 cm Ao Asc diam:  3.60 cm MITRAL VALVE                TRICUSPID VALVE MV Area (PHT): 4.40 cm     TR Peak grad:   33.2 mmHg MV E velocity: 107.00 cm/s  TR Vmax:        288.00 cm/s MV A velocity: 39.80 cm/s MV E/A ratio:  2.69         SHUNTS                             Systemic VTI:  0.20 m  Systemic Diam: 2.00 cm Olga Millers MD Electronically signed by Olga Millers MD Signature Date/Time: 08/20/2023/2:49:12 PM    Final    DG Foot Complete Left  Result Date: 08/20/2023 CLINICAL DATA:  Osteomyelitis EXAM: LEFT FOOT - COMPLETE 3+ VIEW COMPARISON:  None Available. FINDINGS: There is fragmentation of the distal phalanx of the fourth toe. There is no other evidence of acute osseous abnormality in the foot. There is no other osseous erosion or destruction. Bony alignment is normal. There is mild degenerative change about the midfoot and mild inferior calcaneal spurring. There is no soft tissue gas or radiopaque foreign body. IMPRESSION: Fragmentation of the fourth distal phalanx may reflect fracture, osteomyelitis, or combination. Correlate with physical exam and consider MRI as indicated. Electronically Signed   By: Lesia Hausen M.D.   On: 08/20/2023 13:31   - pertinent xrays, CT, MRI studies were reviewed and independently interpreted  Positive ROS: All other systems have  been reviewed and were otherwise negative with the exception of those mentioned in the HPI and as above.  Physical Exam: General: Alert, no acute distress Psychiatric: Patient is competent for consent with normal mood and affect Lymphatic: No axillary or cervical lymphadenopathy Cardiovascular: No pedal edema Respiratory: No cyanosis, no use of accessory musculature GI: No organomegaly, abdomen is soft and non-tender    Images:  @ENCIMAGES @  Labs:  Lab Results  Component Value Date   ESRSEDRATE 31 (H) 08/19/2023   CRP 3.3 (H) 08/19/2023   REPTSTATUS PENDING 08/19/2023   REPTSTATUS PENDING 08/19/2023   CULT  08/19/2023    NO GROWTH < 24 HOURS Performed at [facility], [address], [place], [place] [address]    CULT  08/19/2023    NO GROWTH < 24 HOURS Performed at [facility], [address] [place], [place] [address]     Lab Results  Component Value Date   ALBUMIN 3.4 (L) 08/18/2023   ALBUMIN 4.1 09/02/2019        Latest Ref Rng & Units 08/20/2023    9:48 AM 08/19/2023    4:17 AM 08/18/2023    6:58 PM  CBC EXTENDED  WBC 4.0 - 10.5 K/uL 9.3  13.0  13.6   RBC 4.22 - 5.81 MIL/uL 2.91  2.66  3.03   Hemoglobin 13.0 - 17.0 g/dL 8.8  8.1  9.2   HCT 43.3 - 52.0 % 27.7  25.3  28.4   Platelets 150 - 400 K/uL 114  127  148   NEUT# 1.7 - 7.7 K/uL   11.5   Lymph# 0.7 - 4.0 K/uL   0.6     Neurologic: Patient does not have protective sensation bilateral lower extremities.   MUSCULOSKELETAL:   Skin: Examination there is no cellulitis on either foot.  There is a callus beneath the fifth metatarsal head left foot with swelling of the distal fourth toe.  There is no cellulitis no ulceration on the fourth toe.  Patient has a palpable posterior tibial pulse bilaterally.  Review of the radiographs of the left foot shows no destructive bony changes of the fifth metatarsal head or fifth toe.  There is chronic destructive changes of the middle phalanx of the fourth toe.  White  cell count 9.3 hemoglobin 8.8.  Sed rate 31 and C-reactive protein 3.3.  Assessment: Assessment: Destructive changes of the middle phalanx fourth toe left foot with callus beneath the fifth metatarsal head left foot without cellulitis without signs of acute active infection.  Plan: Plan: Would continue observation  of the left foot no indication for surgical intervention no indication for antibiotics for the left foot.  Thank you for the consult and the opportunity to see Mr. Corinna Gab, MD [facility] [phone] 7:27 AM

## 2023-08-21 NOTE — Progress Notes (Signed)
Daily Progress Note   Patient Name: Kevin Collins       Date: 08/21/2023 DOB: March 01, 1930  Age: 89 y.o. MRN#: [id number] Attending Physician: Burnadette Pop, MD Primary Care Physician: Toma Deiters, MD Admit Date: 08/18/2023  Reason for Consultation/Follow-up: Establishing goals of care  Subjective: Feeling better today, appetite improved, diarrhea improved, relieved to avoid TEE  Length of Stay: 3  Current Medications: Scheduled Meds:   atorvastatin  40 mg Oral Daily   cinacalcet  90 mg Oral Daily   enoxaparin (LOVENOX) injection  70 mg Subcutaneous Q24H   ezetimibe  10 mg Oral Daily   gabapentin  800 mg Oral Daily   latanoprost  1 drop Both Eyes QHS   melatonin  3 mg Oral QHS   multivitamin  1 tablet Oral Daily   oxyCODONE-acetaminophen  1 tablet Oral TID   And   oxyCODONE  2.5 mg Oral TID   pneumococcal 20-valent conjugate vaccine  0.5 mL Intramuscular Tomorrow-1000   traZODone  50 mg Oral QHS    Continuous Infusions:  ampicillin (OMNIPEN) IV 2 g (08/21/23 1141)   cefTRIAXone (ROCEPHIN)  IV 2 g (08/21/23 0846)   lactated ringers 50 mL/hr at 08/21/23 1140    PRN Meds: acetaminophen **OR** acetaminophen, loperamide, magnesium hydroxide, ondansetron **OR** ondansetron (ZOFRAN) IV  Physical Exam Constitutional:      General: He is not in acute distress.    Appearance: He is ill-appearing.  Pulmonary:     Effort: Pulmonary effort is normal.  Skin:    General: Skin is warm and dry.  Neurological:     Mental Status: He is alert and oriented to person, place, and time.             Vital Signs: BP (!) 124/55 (BP Location: Left Arm)   Pulse (!) 59   Temp 98.1 F (36.7 C) (Oral)   Resp 16   Ht 5\' 10"  (1.778 m)   Wt 74.3 kg   SpO2 93%   BMI 23.50 kg/m  SpO2: SpO2: 93 % O2  Device: O2 Device: Room Air O2 Flow Rate:    Intake/output summary:  Intake/Output Summary (Last 24 hours) at 08/21/2023 1624 Last data filed at 08/21/2023 1400 Gross per 24 hour  Intake 1811.88 ml  Output --  Net 1811.88 ml   LBM: Last BM Date : 08/20/23 Baseline Weight: Weight: 73 kg Most recent weight: Weight: 74.3 kg       Palliative Assessment/Data: PPS 60%      Patient Active Problem List   Diagnosis Date Noted   Pain in left foot 08/21/2023   Peripheral neuropathy 08/19/2023   Paroxysmal atrial fibrillation (HCC) 08/19/2023   Sepsis due to undetermined organism (HCC) 08/18/2023   Symptomatic bradycardia 01/24/2022   Bradycardia 05/07/2020   Nonrheumatic aortic valve insufficiency 05/07/2020   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 male  with past medical history of systolic congestive heart failure, A-fib status post pacemaker placement, CAD status post CABG, hypertension, hyperlipidemia, peripheral neuropathy, and recently treated cellulitis of the left fifth toe admitted on 08/18/2023 with complaint of fever, weakness . Blood cultures showed Enterococcus faecalis. Unclear etiology - had left foot infection? Patient has pacemaker and there is concern for CIED infection. TEE being considered. PMT consulted to discuss GOC.   Assessment: Follow up today with patient - no family at bedside. He tells me he is relieved decision has been made not to proceed with TEE. He is hopeful to go home tomorrow  and continue antibiotics at home. His symptoms are improved today. He has no questions or concerns for palliative team. We discuss option of outpatient palliative follow up as we see how he does with outpatient management of infection - he agrees.   Call to son Casimiro Needle. We reviewed above. Reviewed outpatient palliative referral. He agrees. No questions or concerns.   TOC alerted to outpatient palliative referral however when [facility] spoke to patient he politely declined outpatient palliative stating he did not want anyone is his home.   Recommendations/Plan: Consider outpatient palliative referral prior to dc for ongoing follow up No plans for TEE Potential dc tomorrow Established DNR/DNI  Care plan was discussed with patient, son, TOC  Thank you for allowing the Palliative Medicine Team to assist in the care of this patient.   *Please note that this is a verbal dictation therefore any spelling or grammatical errors are due to the "[employer]" system interpretation.  Gerlean Ren, DNP, [facility] Palliative Medicine Team Team Phone # [phone]  Pager [phone]

## 2023-08-21 NOTE — Progress Notes (Signed)
PROGRESS NOTE  Kevin Collins  MVH:[id number] DOB: Dec 18, 1929 DOA: 08/18/2023 PCP: Toma Deiters, MD   Brief Narrative: Patient is a 89 year old male with history of systolic congestive heart failure, A-fib status post pacemaker placement, CAD status post CABG, hypertension, hyperlipidemia, peripheral neuropathy, recently treated cellulitis of the left fifth toe who presented to the emergency department with complaint of fever, weakness.  He had high-grade fever on presentation of 103F, leukocytosis of 13.6 K.  Blood pressure was soft.  Patient was started on IV fluid, broad-spectrum antibiotics for possible sepsis.  There was suspicion for left foot infection.  Blood cultures showed Enterococcus faecalis.  ID consulted.  Currently on ampicillin, ceftriaxone.  Cardiology/orthopedics consulted , plan for conservative management with antibiotics.  No plan for for surgery or TEE.  Awaiting recommendation from ID for antibiotics for discharge  Assessment & Plan:  Principal Problem:   Sepsis due to undetermined organism [facility]) Active Problems:   Dyslipidemia   Paroxysmal atrial fibrillation (HCC)   Peripheral neuropathy   Pain in left foot  Enterococcus faecalis bacteremia: Unclear etiology.  Suspected  left foot infection but not clear if this is the source for bacteremia.  Presented with fever, soft blood pressure.  Started on gentle IV fluid. ID following. Cardiology/EP consulted, no plan for TEE regarding his comorbidities and age.  Plan to continue antibiotics.Awaiting recommendation from ID for antibiotics for discharge.  Currently sepsis physiology has resolved.  Left foot cellulitis/possible osteomyelitis: Recent history of left foot cellulitis treated with antibiotics.    Continue current antibiotics for now.  Orthopedics consulted, no plan for for surgery.  Foot  x-ray showed fragmentation of fourth distal phalanx, no clear evidence of infection  Diarrhea: Complains of frequent loose stools.   No abdominal pain, nausea or vomiting.  Could be from antibiotics. Negative GI pathogen panel, started on Imodium  Paroxysmal A-fib: Status post pacemaker placement.  Takes Eliquis at home, changed to Lovenox.  Normal sinus rhythm  Coronary artery disease: Status post CABG.  No anginal symptoms.  Not on beta-blocker.  On statin  Systolic congestive heart failure: On lisinopril, Lasix, spironolactone at home.  Currently appears euvolemic  CKD stage IIIb: Unclear baseline creatinine.  Last creatinine 01/10/2022 was 1.89 before that his creatinine was ranging in the range of 1.4-1.5 .  Started on IV fluid.  Lasix on hold.  Kidney function improving, creatinine of 1.3  Hyperlipidemia: On statin, Zetia  Anemia/thrombocytopenia: Probably chronic.  Stable  Goals of care: Elderly patient, CODE STATUS DNR.  Will consult palliative  care for further discussion.  Son is interested to discuss about goals of care.         DVT prophylaxis:Lovenox     Code Status: Limited: Do not attempt resuscitation (DNR) -DNR-LIMITED -Do Not Intubate/DNI   Family Communication: Discussed with son on phone on 9/5.  Discussed with son at bedside on 9/6  Patient status:Inpatient  Patient is from :Home  Anticipated discharge WU:XLKG   Estimated DC date:tomorrow, awaiting ID recommendation   Consultants: ID,orthopedics,cardiology  Procedures:None yet  Antimicrobials:  Anti-infectives (From admission, onward)    Start     Dose/Rate Route Frequency Ordered Stop   08/21/23 0400  vancomycin (VANCOREADY) IVPB 750 mg/150 mL  Status:  Discontinued        750 mg 150 mL/hr over 60 Minutes Intravenous Every 48 hours 08/19/23 0252 08/19/23 1436   08/20/23 0000  ceFEPIme (MAXIPIME) 2 g in sodium chloride 0.9 % 100 mL IVPB  Status:  Discontinued  2 g 200 mL/hr over 30 Minutes Intravenous Every 24 hours 08/19/23 0252 08/19/23 1436   08/20/23 0000  ampicillin (OMNIPEN) 2 g in sodium chloride 0.9 % 100 mL IVPB         2 g 300 mL/hr over 20 Minutes Intravenous Every 6 hours 08/19/23 1436     08/19/23 2200  cefTRIAXone (ROCEPHIN) 2 g in sodium chloride 0.9 % 100 mL IVPB        2 g 200 mL/hr over 30 Minutes Intravenous Every 12 hours 08/19/23 1436     08/19/23 0045  vancomycin (VANCOREADY) IVPB 1500 mg/300 mL        1,500 mg 150 mL/hr over 120 Minutes Intravenous  Once 08/18/23 2356 08/19/23 0527   08/18/23 2330  ceFEPIme (MAXIPIME) 2 g in sodium chloride 0.9 % 100 mL IVPB        2 g 200 mL/hr over 30 Minutes Intravenous  Once 08/18/23 2328 08/19/23 0139   08/18/23 2330  metroNIDAZOLE (FLAGYL) IVPB 500 mg  Status:  Discontinued        500 mg 100 mL/hr over 60 Minutes Intravenous Every 12 hours 08/18/23 2328 08/19/23 1436   08/18/23 2330  vancomycin (VANCOCIN) IVPB 1000 mg/200 mL premix  Status:  Discontinued        1,000 mg 200 mL/hr over 60 Minutes Intravenous  Once 08/18/23 2328 08/18/23 2355   08/18/23 2000  cefTRIAXone (ROCEPHIN) 1 g in sodium chloride 0.9 % 100 mL IVPB        1 g 200 mL/hr over 30 Minutes Intravenous  Once 08/18/23 1947 08/18/23 2149       Subjective: Patient seen and examined at bedside today.  Appears very comfortable today.  Hemodynamically stable, no fever.  Lying in bed.  Long discussion held with the son at the bedside.  Objective: Vitals:   08/20/23 2049 08/21/23 0529 08/21/23 0659 08/21/23 0748  BP: (!) 117/58 117/64  117/63  Pulse: 60 62  64  Resp: 18 18  16   Temp: (!) 97.5 F (36.4 C) 97.6 F (36.4 C)  (!) 97.4 F (36.3 C)  TempSrc: Oral Oral  Oral  SpO2: 95% 93%  93%  Weight:   74.3 kg   Height:        Intake/Output Summary (Last 24 hours) at 08/21/2023 1131 Last data filed at 08/21/2023 0851 Gross per 24 hour  Intake 1793.76 ml  Output --  Net 1793.76 ml   Filed Weights   08/18/23 2326 08/20/23 0643 08/21/23 0659  Weight: 72.8 kg 74.3 kg 74.3 kg    Examination:   General exam: Overall comfortable, not in distress, pleasant elderly  male HEENT: PERRL, hard of hearing Respiratory system:  no wheezes or crackles  Cardiovascular system: S1 & S2 heard, RRR.  Pacemaker Gastrointestinal system: Abdomen is nondistended, soft and nontender. Central nervous system: Alert and oriented Extremities: No edema, no clubbing ,no cyanosis, healing ulcer on the lateral left foot Skin: No rashes, no ulcers,no icterus     Data Reviewed: I have personally reviewed following labs and imaging studies  CBC: Recent Labs  Lab 08/18/23 1858 08/19/23 0417 08/20/23 0948  WBC 13.6* 13.0* 9.3  NEUTROABS 11.5*  --   --   HGB 9.2* 8.1* 8.8*  HCT 28.4* 25.3* 27.7*  MCV 93.7 95.1 95.2  PLT 148* 127* 114*   Basic Metabolic Panel: Recent Labs  Lab 08/18/23 1858 08/19/23 0417 08/20/23 0948 08/21/23 0815  NA 130* 133* 132* 135  K 4.4 4.0  3.6 3.5  CL 98 98 101 102  CO2 23 22 19* 19*  GLUCOSE 108* 103* 101* 88  BUN 36* 34* 29* 26*  CREATININE 1.95* 1.66* 1.50* 1.35*  CALCIUM 7.3* 7.3* 7.2* 7.3*     Recent Results (from the past 240 hour(s))  Resp panel by RT-PCR (RSV, Flu A&B, Covid) Anterior Nasal Swab     Status: None   Collection Time: 08/18/23  6:48 PM   Specimen: Anterior Nasal Swab  Result Value Ref Range Status   SARS Coronavirus 2 by RT PCR NEGATIVE NEGATIVE Final    Comment: (NOTE) SARS-CoV-2 target nucleic acids are NOT DETECTED.  The SARS-CoV-2 RNA is generally detectable in upper respiratory specimens during the acute phase of infection. The lowest concentration of SARS-CoV-2 viral copies this assay can detect is 138 copies/mL. A negative result does not preclude SARS-Cov-2 infection and should not be used as the sole basis for treatment or other patient management decisions. A negative result may occur with  improper specimen collection/handling, submission of specimen other than nasopharyngeal swab, presence of viral mutation(s) within the areas targeted by this assay, and inadequate number of viral copies(<138  copies/mL). A negative result must be combined with clinical observations, patient history, and epidemiological information. The expected result is Negative.  Fact Sheet for Patients:  BloggerCourse.com  Fact Sheet for Healthcare Providers:  SeriousBroker.it  This test is no t yet approved or cleared by the [place] FDA and  has been authorized for detection and/or diagnosis of SARS-CoV-2 by FDA under an Emergency Use Authorization (EUA). This EUA will remain  in effect (meaning this test can be used) for the duration of the COVID-19 declaration under Section 564(b)(1) of the Act, 21 U.S.C.section 360bbb-3(b)(1), unless the authorization is terminated  or revoked sooner.       Influenza A by PCR NEGATIVE NEGATIVE Final   Influenza B by PCR NEGATIVE NEGATIVE Final    Comment: (NOTE) The Xpert Xpress SARS-CoV-2/FLU/RSV plus assay is intended as an aid in the diagnosis of influenza from Nasopharyngeal swab specimens and should not be used as a sole basis for treatment. Nasal washings and aspirates are unacceptable for Xpert Xpress SARS-CoV-2/FLU/RSV testing.  Fact Sheet for Patients: BloggerCourse.com  Fact Sheet for Healthcare Providers: SeriousBroker.it  This test is not yet approved or cleared by the [place] FDA and has been authorized for detection and/or diagnosis of SARS-CoV-2 by FDA under an Emergency Use Authorization (EUA). This EUA will remain in effect (meaning this test can be used) for the duration of the COVID-19 declaration under Section 564(b)(1) of the Act, 21 U.S.C. section 360bbb-3(b)(1), unless the authorization is terminated or revoked.     Resp Syncytial Virus by PCR NEGATIVE NEGATIVE Final    Comment: (NOTE) Fact Sheet for Patients: BloggerCourse.com  Fact Sheet for Healthcare  Providers: SeriousBroker.it  This test is not yet approved or cleared by the [place] FDA and has been authorized for detection and/or diagnosis of SARS-CoV-2 by FDA under an Emergency Use Authorization (EUA). This EUA will remain in effect (meaning this test can be used) for the duration of the COVID-19 declaration under Section 564(b)(1) of the Act, 21 U.S.C. section 360bbb-3(b)(1), unless the authorization is terminated or revoked.  Performed at [facility], [address], [place], [place] [address]   Blood Culture (routine x 2)     Status: Abnormal   Collection Time: 08/18/23  7:09 PM   Specimen: BLOOD RIGHT ARM  Result Value Ref Range Status  Specimen Description   Final    BLOOD RIGHT ARM Performed at [facility], [address] [place], [place] [address]    Special Requests   Final    BOTTLES DRAWN AEROBIC AND ANAEROBIC Blood Culture adequate volume Performed at [facility], [address] [place], [place] [address]    Culture  Setup Time   Final    GRAM POSITIVE COCCI IN CHAINS IN BOTH AEROBIC AND ANAEROBIC BOTTLES Gram Stain Report Called to,Read Back By and Verified With: BLACKWELL,S. ON 08/19/2023 AT 0929 BY FRATTO,A. CRITICAL RESULT CALLED TO, READ BACK BY AND VERIFIED WITH: Raynaldo Opitz [id number] AT 1412 BY CM Performed at [facility], [address], [place], [place] [address]    Culture ENTEROCOCCUS FAECALIS (A)  Final   Report Status 08/21/2023 FINAL  Final   Organism ID, Bacteria ENTEROCOCCUS FAECALIS  Final      Susceptibility   Enterococcus faecalis - MIC*    AMPICILLIN <=2 SENSITIVE Sensitive     VANCOMYCIN 1 SENSITIVE Sensitive     GENTAMICIN SYNERGY SENSITIVE Sensitive     * ENTEROCOCCUS FAECALIS  Blood Culture (routine x 2)     Status: Abnormal   Collection Time: 08/18/23  7:09 PM   Specimen: BLOOD LEFT ARM  Result Value Ref Range Status   Specimen Description   Final    BLOOD LEFT ARM Performed at  [facility], [address], [place], [place] [address]    Special Requests   Final    BOTTLES DRAWN AEROBIC AND ANAEROBIC Blood Culture adequate volume Performed at [facility], [address], [place], [place] [address]    Culture  Setup Time   Final    GRAM POSITIVE COCCI IN CHAINS IN BOTH AEROBIC AND ANAEROBIC BOTTLES Gram Stain Report Called to,Read Back By and Verified With: BLACKWELL,S. ON 08/19/2023 AT 0929 BY FRATTO,A. CRITICAL VALUE NOTED.  VALUE IS CONSISTENT WITH PREVIOUSLY REPORTED AND CALLED VALUE.    Culture (A)  Final    ENTEROCOCCUS FAECALIS SUSCEPTIBILITIES PERFORMED ON PREVIOUS CULTURE WITHIN THE LAST 5 DAYS. Performed at [facility], [address] [place], [place] [address]    Report Status 08/21/2023 FINAL  Final  Blood Culture ID Panel (Reflexed)     Status: Abnormal   Collection Time: 08/18/23  7:09 PM  Result Value Ref Range Status   Enterococcus faecalis DETECTED (A) NOT DETECTED Final    Comment: CRITICAL RESULT CALLED TO, READ BACK BY AND VERIFIED WITH: PHARMD S HURTH [id number] AT 1412 BY CM    Enterococcus Faecium NOT DETECTED NOT DETECTED Final   Listeria monocytogenes NOT DETECTED NOT DETECTED Final   Staphylococcus species NOT DETECTED NOT DETECTED Final   Staphylococcus aureus (BCID) NOT DETECTED NOT DETECTED Final   Staphylococcus epidermidis NOT DETECTED NOT DETECTED Final   Staphylococcus lugdunensis NOT DETECTED NOT DETECTED Final   Streptococcus species NOT DETECTED NOT DETECTED Final   Streptococcus agalactiae NOT DETECTED NOT DETECTED Final   Streptococcus pneumoniae NOT DETECTED NOT DETECTED Final   Streptococcus pyogenes NOT DETECTED NOT DETECTED Final   A.calcoaceticus-baumannii NOT DETECTED NOT DETECTED Final   Bacteroides fragilis NOT DETECTED NOT DETECTED Final   Enterobacterales NOT DETECTED NOT DETECTED Final   Enterobacter cloacae complex NOT DETECTED NOT DETECTED Final   Escherichia coli NOT DETECTED NOT DETECTED  Final   Klebsiella aerogenes NOT DETECTED NOT DETECTED Final   Klebsiella oxytoca NOT DETECTED NOT DETECTED Final   Klebsiella pneumoniae NOT  DETECTED NOT DETECTED Final   Proteus species NOT DETECTED NOT DETECTED Final   Salmonella species NOT DETECTED NOT DETECTED Final   Serratia marcescens NOT DETECTED NOT DETECTED Final   Haemophilus influenzae NOT DETECTED NOT DETECTED Final   Neisseria meningitidis NOT DETECTED NOT DETECTED Final   Pseudomonas aeruginosa NOT DETECTED NOT DETECTED Final   Stenotrophomonas maltophilia NOT DETECTED NOT DETECTED Final   Candida albicans NOT DETECTED NOT DETECTED Final   Candida auris NOT DETECTED NOT DETECTED Final   Candida glabrata NOT DETECTED NOT DETECTED Final   Candida krusei NOT DETECTED NOT DETECTED Final   Candida parapsilosis NOT DETECTED NOT DETECTED Final   Candida tropicalis NOT DETECTED NOT DETECTED Final   Cryptococcus neoformans/gattii NOT DETECTED NOT DETECTED Final   Vancomycin resistance NOT DETECTED NOT DETECTED Final    Comment: Performed at [facility], [address] [place], [place] [address]  Culture, blood (Routine X 2) w Reflex to ID Panel     Status: None (Preliminary result)   Collection Time: 08/19/23 12:18 PM   Specimen: BLOOD  Result Value Ref Range Status   Specimen Description BLOOD LW  Final   Special Requests   Final    BOTTLES DRAWN AEROBIC AND ANAEROBIC Blood Culture adequate volume   Culture   Final    NO GROWTH 2 DAYS Performed at [facility], [address], [place], [place] [address]    Report Status PENDING  Incomplete  Culture, blood (Routine X 2) w Reflex to ID Panel     Status: None (Preliminary result)   Collection Time: 08/19/23 12:18 PM   Specimen: BLOOD  Result Value Ref Range Status   Specimen Description BLOOD BLOOD RIGHT HAND  Final   Special Requests   Final    BOTTLES DRAWN AEROBIC AND ANAEROBIC Blood Culture adequate volume   Culture   Final    NO GROWTH 2 DAYS Performed at  [facility], [address], [place], [place] [address]    Report Status PENDING  Incomplete  Gastrointestinal Panel by PCR , Stool     Status: None   Collection Time: 08/20/23 10:13 AM   Specimen: Stool  Result Value Ref Range Status   Campylobacter species NOT DETECTED NOT DETECTED Final   Plesimonas shigelloides NOT DETECTED NOT DETECTED Final   Salmonella species NOT DETECTED NOT DETECTED Final   Yersinia enterocolitica NOT DETECTED NOT DETECTED Final   Vibrio species NOT DETECTED NOT DETECTED Final   Vibrio cholerae NOT DETECTED NOT DETECTED Final   Enteroaggregative E coli (EAEC) NOT DETECTED NOT DETECTED Final   Enteropathogenic E coli (EPEC) NOT DETECTED NOT DETECTED Final   Enterotoxigenic E coli (ETEC) NOT DETECTED NOT DETECTED Final   Shiga like toxin producing E coli (STEC) NOT DETECTED NOT DETECTED Final   Shigella/Enteroinvasive E coli (EIEC) NOT DETECTED NOT DETECTED Final   Cryptosporidium NOT DETECTED NOT DETECTED Final   Cyclospora cayetanensis NOT DETECTED NOT DETECTED Final   Entamoeba histolytica NOT DETECTED NOT DETECTED Final   Giardia lamblia NOT DETECTED NOT DETECTED Final   Adenovirus F40/41 NOT DETECTED NOT DETECTED Final   Astrovirus NOT DETECTED NOT DETECTED Final   Norovirus GI/GII NOT DETECTED NOT DETECTED Final   Rotavirus A NOT DETECTED NOT DETECTED Final   Sapovirus (I, II, IV, and V) NOT DETECTED NOT DETECTED Final    Comment: Performed at [facility], [address], [place], [place] [address]     Radiology Studies: ECHOCARDIOGRAM COMPLETE  Result  Date: 08/20/2023    ECHOCARDIOGRAM REPORT   Patient Name:   ANGEL DIAGNE Date of Exam: 08/20/2023 Medical Rec #:  [id number]   Height:       70.0 in Accession #:    [id number]  Weight:       163.8 lb Date of Birth:  06-Aug-1930   BSA:          1.918 m Patient Age:    89 years    BP:           113/54 mmHg Patient Gender: M           HR:           63 bpm. Exam Location:  Inpatient Procedure: 2D Echo,  Cardiac Doppler, Color Doppler and Intracardiac            Opacification Agent Indications:    Bacteremia R78.81  History:        Patient has prior history of Echocardiogram examinations, most                 recent 01/14/2022. CAD; Risk Factors:Dyslipidemia and                 Hypertension.  Sonographer:    Harriette Bouillon RDCS Referring Phys: [id number] [facility] IMPRESSIONS  1. Left ventricular ejection fraction, by estimation, is 50 to 55%. The left ventricle has low normal function. The left ventricle has no regional wall motion abnormalities. The left ventricular internal cavity size was mildly dilated. Left ventricular diastolic parameters are indeterminate.  2. Right ventricular systolic function is normal. The right ventricular size is mildly enlarged. There is moderately elevated pulmonary artery systolic pressure.  3. Left atrial size was severely dilated.  4. Right atrial size was severely dilated.  5. The mitral valve is normal in structure. Mild mitral valve regurgitation. No evidence of mitral stenosis.  6. The aortic valve is tricuspid. Aortic valve regurgitation is mild. No aortic stenosis is present.  7. The inferior vena cava is dilated in size with <50% respiratory variability, suggesting right atrial pressure of 15 mmHg. FINDINGS  Left Ventricle: Left ventricular ejection fraction, by estimation, is 50 to 55%. The left ventricle has low normal function. The left ventricle has no regional wall motion abnormalities. Definity contrast agent was given IV to delineate the left ventricular endocardial borders. The left ventricular internal cavity size was mildly dilated. There is no left ventricular hypertrophy. Left ventricular diastolic parameters are indeterminate. Right Ventricle: The right ventricular size is mildly enlarged. Right ventricular systolic function is normal. There is moderately elevated pulmonary artery systolic pressure. The tricuspid regurgitant velocity is 2.88 m/s, and with an  assumed right atrial pressure of 15 mmHg, the estimated right ventricular systolic pressure is 48.2 mmHg. Left Atrium: Left atrial size was severely dilated. Right Atrium: Right atrial size was severely dilated. Pericardium: There is no evidence of pericardial effusion. Mitral Valve: The mitral valve is normal in structure. Mild mitral valve regurgitation. No evidence of mitral valve stenosis. Tricuspid Valve: The tricuspid valve is normal in structure. Tricuspid valve regurgitation is mild . No evidence of tricuspid stenosis. Aortic Valve: The aortic valve is tricuspid. Aortic valve regurgitation is mild. No aortic stenosis is present. Pulmonic Valve: The pulmonic valve was normal in structure. Pulmonic valve regurgitation is trivial. No evidence of pulmonic stenosis. Aorta: The aortic root is normal in size and structure. Venous: The inferior vena cava is dilated in size with less than 50% respiratory variability, suggesting right  atrial pressure of 15 mmHg. IAS/Shunts: No atrial level shunt detected by color flow Doppler. Additional Comments: A device lead is visualized.  LEFT VENTRICLE PLAX 2D LVIDd:         6.00 cm   Diastology LVIDs:         4.30 cm   LV e' medial:    9.25 cm/s LV PW:         0.70 cm   LV E/e' medial:  11.6 LV IVS:        0.70 cm   LV e' lateral:   6.53 cm/s LVOT diam:     2.00 cm   LV E/e' lateral: 16.4 LV SV:         64 LV SV Index:   34 LVOT Area:     3.14 cm  RIGHT VENTRICLE            IVC RV S prime:     7.72 cm/s  IVC diam: 2.60 cm TAPSE (M-mode): 1.8 cm LEFT ATRIUM            Index        RIGHT ATRIUM           Index LA diam:      4.90 cm  2.56 cm/m   RA Area:     26.00 cm LA Vol (A4C): 103.0 ml 53.72 ml/m  RA Volume:   82.60 ml  43.08 ml/m  AORTIC VALVE             PULMONIC VALVE LVOT Vmax:   112.00 cm/s PV Vmax:       0.83 m/s LVOT Vmean:  78.300 cm/s PV Peak grad:  2.7 mmHg LVOT VTI:    0.205 m  AORTA Ao Root diam: 3.70 cm Ao Asc diam:  3.60 cm MITRAL VALVE                 TRICUSPID VALVE MV Area (PHT): 4.40 cm     TR Peak grad:   33.2 mmHg MV E velocity: 107.00 cm/s  TR Vmax:        288.00 cm/s MV A velocity: 39.80 cm/s MV E/A ratio:  2.69         SHUNTS                             Systemic VTI:  0.20 m                             Systemic Diam: 2.00 cm Olga Millers MD Electronically signed by Olga Millers MD Signature Date/Time: 08/20/2023/2:49:12 PM    Final    DG Foot Complete Left  Result Date: 08/20/2023 CLINICAL DATA:  Osteomyelitis EXAM: LEFT FOOT - COMPLETE 3+ VIEW COMPARISON:  None Available. FINDINGS: There is fragmentation of the distal phalanx of the fourth toe. There is no other evidence of acute osseous abnormality in the foot. There is no other osseous erosion or destruction. Bony alignment is normal. There is mild degenerative change about the midfoot and mild inferior calcaneal spurring. There is no soft tissue gas or radiopaque foreign body. IMPRESSION: Fragmentation of the fourth distal phalanx may reflect fracture, osteomyelitis, or combination. Correlate with physical exam and consider MRI as indicated. Electronically Signed   By: Lesia Hausen M.D.   On: 08/20/2023 13:31    Scheduled Meds:  atorvastatin  40 mg Oral Daily   cinacalcet  90 mg Oral Daily   enoxaparin (LOVENOX) injection  70 mg Subcutaneous Q24H   ezetimibe  10 mg Oral Daily   gabapentin  800 mg Oral Daily   latanoprost  1 drop Both Eyes QHS   melatonin  3 mg Oral QHS   multivitamin  1 tablet Oral Daily   oxyCODONE-acetaminophen  1 tablet Oral TID   And   oxyCODONE  2.5 mg Oral TID   pneumococcal 20-valent conjugate vaccine  0.5 mL Intramuscular Tomorrow-1000   traZODone  50 mg Oral QHS   Continuous Infusions:  ampicillin (OMNIPEN) IV 2 g (08/21/23 0600)   cefTRIAXone (ROCEPHIN)  IV 2 g (08/21/23 0846)   lactated ringers 50 mL/hr at 08/20/23 1617     LOS: 3 days   Burnadette Pop, MD [facility] P9/05/2023, 11:31 AM

## 2023-08-21 NOTE — Progress Notes (Addendum)
[facility] Infectious Diseases Follow Up Note  Patient Identification: Patient Name: Kevin Collins MRN: [id number] Admit Date: 08/18/2023  6:42 PM Age: 89 y.o.Today's Date: 08/21/2023  Reason for Visit: E faecalis bacteremia   Principal Problem:   Sepsis due to undetermined organism [facility]) Active Problems:   Dyslipidemia   Peripheral neuropathy   Paroxysmal atrial fibrillation (HCC)   Pain in left foot   Antibiotics: IV ampicillin 9/4- Ceftriaxone 9/3- Metronidazole 9/3-9/4 Vancomycin 9/3 Cefepime 9/3  Lines/Hardware: biV PPM   Interval Events: Fevers have resolved, labs remarkable for creatinine improving at 1.35. 9/4 blood culture no growth in 2 days.  Seen by orthopedics, no need for surgical intervention as well as antibiotics for left foot.  X-ray left foot with fragmentation of the fourth distal phalanx which may reflect fracture versus osteomyelitis or combination.  Seen by EP Dr. Lalla Brothers, no plans for TEE as well as PPM removal given his age as well as co-morbidities.    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 Admitted with   # E faecalis bacteremia r/o endocarditis as well as CIED infection - no clear source, no GI or GU symptoms. Has a ulcer in the left lateral foot for which  he was taking abtx one day PTA ( unknown name) which has healed with no signs of infection    # Left lateral foot ulcer, appears to be healing: seen by Orthopedics, no surgical intervention as well as antibiotics recommended  Recommendations Continue IV ampicillin and ceftriaxone for possible E faecalis endocarditis as well as CIED infection.  Plan for 6 weeks of IV antibiotics from negative blood cultures 9/4 assuming they remain negative followed by p.o.  amoxicillin lifelong Follow-up repeat blood cultures 9/4 for clearance Okay to place PICC once blood cultures are negative and 3 days Monitor CBC and BMP on abtx ID will so, please recall if needed   OPAT  Diagnosis: E faecalis bacteremia, cannot r/o endocarditis and CIED infection   Culture Result: E faecalis   No Known Allergies  OPAT Orders Discharge antibiotics to be given via PICC line Discharge antibiotics: IV ampicillin and ceftriaxone Per pharmacy protocol  Duration: 6 weeks End Date: 10/16  [facility] Care Per Protocol:  Home health RN for IV administration and teaching; PICC line care and labs.    Labs weekly while on IV antibiotics: X__ CBC with differential __ BMP X__ CMP __ CRP __ ESR __ Vancomycin trough __ CK  X__ Please pull PIC at completion of IV antibiotics __ Please leave PIC in place until doctor has seen patient or been notified  Fax weekly labs to [phone]  Clinic Follow Up Appt: 9/25 at 10: 45 am   Rest of the management as per the primary team. Thank you for the consult. Please page with pertinent questions or concerns.  ______________________________________________________________________ Subjective patient seen and examined at the bedside. No complaints. Discussed plan for PICC line and IV abtx, reports diarrhea has improved with Imodium. Denies nausea, vomiting.   Vitals BP 117/63 (BP Location: Left Arm)   Pulse 64   Temp (!) 97.4 F (36.3 C) (Oral)   Resp 16   Ht 5\' 10"  (1.778 m)   Wt 74.3 kg   SpO2 93%   BMI 23.50 kg/m     Physical Exam Constitutional:  elderly male sitting in the  bed, HEENT wnl     Comments: He is hard of hearing   Cardiovascular:     Rate and Rhythm: Normal rate and regular rhythm.     Heart sounds: s1s2   Pulmonary:     Effort: Pulmonary effort is normal.     Comments:   Abdominal:     Palpations: Abdomen is soft.     Tenderness:   Musculoskeletal:        General: No swelling or  tenderness.   Skin:    Comments: Left lateral foot ulcer healed   Neurological:     General: awake, alert, oriented, following commands   Psychiatric:        Mood and Affect: Mood normal.   Pertinent Microbiology Results for orders placed or performed during the hospital encounter of 08/18/23  Resp panel by RT-PCR (RSV, Flu A&B, Covid) Anterior Nasal Swab     Status: None   Collection Time: 08/18/23  6:48 PM   Specimen: Anterior Nasal Swab  Result Value Ref Range Status   SARS Coronavirus 2 by RT PCR NEGATIVE NEGATIVE Final    Comment: (NOTE) SARS-CoV-2 target nucleic acids are NOT DETECTED.  The SARS-CoV-2 RNA is generally detectable in upper respiratory specimens during the acute phase of infection. The lowest concentration of SARS-CoV-2 viral copies this assay can detect is 138 copies/mL. A negative result does not preclude SARS-Cov-2 infection and should not be used as the sole basis for treatment or other patient management decisions. A negative result may occur with  improper specimen collection/handling, submission of specimen other than nasopharyngeal swab, presence of viral mutation(s) within the areas targeted by this assay, and inadequate number of viral copies(<138 copies/mL). A negative result must be combined with clinical observations, patient history, and epidemiological information. The expected result is Negative.  Fact Sheet for Patients:  BloggerCourse.com  Fact Sheet for Healthcare Providers:  SeriousBroker.it  This test is no t yet approved or cleared by the [place] FDA and  has been authorized for detection and/or diagnosis of SARS-CoV-2 by FDA under an Emergency Use Authorization (EUA). This EUA will remain  in effect (meaning this test can be used) for the duration of the COVID-19 declaration under Section 564(b)(1) of the Act, 21 U.S.C.section 360bbb-3(b)(1), unless the authorization is  terminated  or revoked sooner.       Influenza A by PCR NEGATIVE NEGATIVE Final   Influenza B by PCR NEGATIVE NEGATIVE Final    Comment: (NOTE) The Xpert Xpress SARS-CoV-2/FLU/RSV plus assay is intended as an aid in the diagnosis of influenza from Nasopharyngeal swab specimens and should not be used as a sole basis for treatment. Nasal washings and aspirates are unacceptable for Xpert Xpress SARS-CoV-2/FLU/RSV testing.  Fact Sheet for Patients: BloggerCourse.com  Fact Sheet for Healthcare Providers: SeriousBroker.it  This test is not yet approved or cleared by the [place] FDA and has been authorized for detection and/or diagnosis of SARS-CoV-2 by FDA under an Emergency Use Authorization (EUA). This EUA will remain in effect (meaning this test can be used) for the duration of the COVID-19 declaration under Section 564(b)(1) of the Act, 21 U.S.C. section 360bbb-3(b)(1), unless the authorization is terminated or revoked.     Resp Syncytial Virus by PCR NEGATIVE NEGATIVE Final    Comment: (NOTE) Fact Sheet for Patients: BloggerCourse.com  Fact Sheet for Healthcare Providers: SeriousBroker.it  This test is not yet approved or cleared by the [place] FDA and has been authorized for detection and/or diagnosis of  SARS-CoV-2 by FDA under an Emergency Use Authorization (EUA). This EUA will remain in effect (meaning this test can be used) for the duration of the COVID-19 declaration under Section 564(b)(1) of the Act, 21 U.S.C. section 360bbb-3(b)(1), unless the authorization is terminated or revoked.  Performed at [facility], [address], [place], [place] [address]   Blood Culture (routine x 2)     Status: Abnormal   Collection Time: 08/18/23  7:09 PM   Specimen: BLOOD RIGHT ARM  Result Value Ref Range Status   Specimen Description   Final    BLOOD RIGHT  ARM Performed at [facility], [address], [place], [place] [address]    Special Requests   Final    BOTTLES DRAWN AEROBIC AND ANAEROBIC Blood Culture adequate volume Performed at [facility], [address] [place], [place] [address]    Culture  Setup Time   Final    GRAM POSITIVE COCCI IN CHAINS IN BOTH AEROBIC AND ANAEROBIC BOTTLES Gram Stain Report Called to,Read Back By and Verified With: BLACKWELL,S. ON 08/19/2023 AT 0929 BY FRATTO,A. CRITICAL RESULT CALLED TO, READ BACK BY AND VERIFIED WITH: Raynaldo Opitz [id number] AT 1412 BY CM Performed at [facility], [address], [place], [place] [address]    Culture ENTEROCOCCUS FAECALIS (A)  Final   Report Status 08/21/2023 FINAL  Final   Organism ID, Bacteria ENTEROCOCCUS FAECALIS  Final      Susceptibility   Enterococcus faecalis - MIC*    AMPICILLIN <=2 SENSITIVE Sensitive     VANCOMYCIN 1 SENSITIVE Sensitive     GENTAMICIN SYNERGY SENSITIVE Sensitive     * ENTEROCOCCUS FAECALIS  Blood Culture (routine x 2)     Status: Abnormal   Collection Time: 08/18/23  7:09 PM   Specimen: BLOOD LEFT ARM  Result Value Ref Range Status   Specimen Description   Final    BLOOD LEFT ARM Performed at [facility], [address] [place], [place] [address]    Special Requests   Final    BOTTLES DRAWN AEROBIC AND ANAEROBIC Blood Culture adequate volume Performed at [facility], [address] [place], [place] [address]    Culture  Setup Time   Final    GRAM POSITIVE COCCI IN CHAINS IN BOTH AEROBIC AND ANAEROBIC BOTTLES Gram Stain Report Called to,Read Back By and Verified With: BLACKWELL,S. ON 08/19/2023 AT 0929 BY FRATTO,A. CRITICAL VALUE NOTED.  VALUE IS CONSISTENT WITH PREVIOUSLY REPORTED AND CALLED VALUE.    Culture (A)  Final    ENTEROCOCCUS FAECALIS SUSCEPTIBILITIES PERFORMED ON PREVIOUS CULTURE WITHIN THE LAST 5 DAYS. Performed at [facility], [address] [place], [place] [address]    Report Status  08/21/2023 FINAL  Final  Blood Culture ID Panel (Reflexed)     Status: Abnormal   Collection Time: 08/18/23  7:09 PM  Result Value Ref Range Status   Enterococcus faecalis DETECTED (A) NOT DETECTED Final    Comment: CRITICAL RESULT CALLED TO, READ BACK BY AND VERIFIED WITH: PHARMD S HURTH [id number] AT 1412 BY CM    Enterococcus Faecium NOT DETECTED NOT DETECTED Final   Listeria monocytogenes NOT DETECTED NOT DETECTED Final   Staphylococcus species NOT DETECTED NOT DETECTED Final   Staphylococcus aureus (BCID) NOT DETECTED NOT DETECTED Final   Staphylococcus epidermidis NOT DETECTED NOT DETECTED Final   Staphylococcus lugdunensis NOT DETECTED NOT DETECTED Final   Streptococcus species NOT DETECTED NOT DETECTED Final  Streptococcus agalactiae NOT DETECTED NOT DETECTED Final   Streptococcus pneumoniae NOT DETECTED NOT DETECTED Final   Streptococcus pyogenes NOT DETECTED NOT DETECTED Final   A.calcoaceticus-baumannii NOT DETECTED NOT DETECTED Final   Bacteroides fragilis NOT DETECTED NOT DETECTED Final   Enterobacterales NOT DETECTED NOT DETECTED Final   Enterobacter cloacae complex NOT DETECTED NOT DETECTED Final   Escherichia coli NOT DETECTED NOT DETECTED Final   Klebsiella aerogenes NOT DETECTED NOT DETECTED Final   Klebsiella oxytoca NOT DETECTED NOT DETECTED Final   Klebsiella pneumoniae NOT DETECTED NOT DETECTED Final   Proteus species NOT DETECTED NOT DETECTED Final   Salmonella species NOT DETECTED NOT DETECTED Final   Serratia marcescens NOT DETECTED NOT DETECTED Final   Haemophilus influenzae NOT DETECTED NOT DETECTED Final   Neisseria meningitidis NOT DETECTED NOT DETECTED Final   Pseudomonas aeruginosa NOT DETECTED NOT DETECTED Final   Stenotrophomonas maltophilia NOT DETECTED NOT DETECTED Final   Candida albicans NOT DETECTED NOT DETECTED Final   Candida auris NOT DETECTED NOT DETECTED Final   Candida glabrata NOT DETECTED NOT DETECTED Final   Candida krusei NOT DETECTED  NOT DETECTED Final   Candida parapsilosis NOT DETECTED NOT DETECTED Final   Candida tropicalis NOT DETECTED NOT DETECTED Final   Cryptococcus neoformans/gattii NOT DETECTED NOT DETECTED Final   Vancomycin resistance NOT DETECTED NOT DETECTED Final    Comment: Performed at [facility], [address] [place], [place] [address]  Culture, blood (Routine X 2) w Reflex to ID Panel     Status: None (Preliminary result)   Collection Time: 08/19/23 12:18 PM   Specimen: BLOOD  Result Value Ref Range Status   Specimen Description BLOOD LW  Final   Special Requests   Final    BOTTLES DRAWN AEROBIC AND ANAEROBIC Blood Culture adequate volume   Culture   Final    NO GROWTH 2 DAYS Performed at [facility], [address] [place], [place] [address]    Report Status PENDING  Incomplete  Culture, blood (Routine X 2) w Reflex to ID Panel     Status: None (Preliminary result)   Collection Time: 08/19/23 12:18 PM   Specimen: BLOOD  Result Value Ref Range Status   Specimen Description BLOOD BLOOD RIGHT HAND  Final   Special Requests   Final    BOTTLES DRAWN AEROBIC AND ANAEROBIC Blood Culture adequate volume   Culture   Final    NO GROWTH 2 DAYS Performed at [facility], [address], [place], [place] [address]    Report Status PENDING  Incomplete  Gastrointestinal Panel by PCR , Stool     Status: None   Collection Time: 08/20/23 10:13 AM   Specimen: Stool  Result Value Ref Range Status   Campylobacter species NOT DETECTED NOT DETECTED Final   Plesimonas shigelloides NOT DETECTED NOT DETECTED Final   Salmonella species NOT DETECTED NOT DETECTED Final   Yersinia enterocolitica NOT DETECTED NOT DETECTED Final   Vibrio species NOT DETECTED NOT DETECTED Final   Vibrio cholerae NOT DETECTED NOT DETECTED Final   Enteroaggregative E coli (EAEC) NOT DETECTED NOT DETECTED Final   Enteropathogenic E coli (EPEC) NOT DETECTED NOT DETECTED Final   Enterotoxigenic E coli (ETEC) NOT DETECTED NOT  DETECTED Final   Shiga like toxin producing E coli (STEC) NOT DETECTED NOT DETECTED Final   Shigella/Enteroinvasive E coli (EIEC) NOT DETECTED NOT DETECTED Final   Cryptosporidium NOT DETECTED NOT DETECTED Final   Cyclospora cayetanensis NOT DETECTED NOT DETECTED  Final   Entamoeba histolytica NOT DETECTED NOT DETECTED Final   Giardia lamblia NOT DETECTED NOT DETECTED Final   Adenovirus F40/41 NOT DETECTED NOT DETECTED Final   Astrovirus NOT DETECTED NOT DETECTED Final   Norovirus GI/GII NOT DETECTED NOT DETECTED Final   Rotavirus A NOT DETECTED NOT DETECTED Final   Sapovirus (I, II, IV, and V) NOT DETECTED NOT DETECTED Final    Comment: Performed at [facility], [address], [place], [place] [address]    Pertinent Lab.    Latest Ref Rng & Units 08/20/2023    9:48 AM 08/19/2023    4:17 AM 08/18/2023    6:58 PM  CBC  WBC 4.0 - 10.5 K/uL 9.3  13.0  13.6   Hemoglobin 13.0 - 17.0 g/dL 8.8  8.1  9.2   Hematocrit 39.0 - 52.0 % 27.7  25.3  28.4   Platelets 150 - 400 K/uL 114  127  148       Latest Ref Rng & Units 08/21/2023    8:15 AM 08/20/2023    9:48 AM 08/19/2023    4:17 AM  CMP  Glucose 70 - 99 mg/dL 88  604  540   BUN 8 - 23 mg/dL 26  29  34   Creatinine 0.61 - 1.24 mg/dL 9.81  1.91  4.78   Sodium 135 - 145 mmol/L 135  132  133   Potassium 3.5 - 5.1 mmol/L 3.5  3.6  4.0   Chloride 98 - 111 mmol/L 102  101  98   CO2 22 - 32 mmol/L 19  19  22    Calcium 8.9 - 10.3 mg/dL 7.3  7.2  7.3    Pertinent Imaging today Plain films and CT images have been personally visualized and interpreted; radiology reports have been reviewed. Decision making incorporated into the Impression /   ECHOCARDIOGRAM COMPLETE  Result Date: 08/20/2023    ECHOCARDIOGRAM REPORT   Patient Name:   QUEEN WEYMAN Date of Exam: 08/20/2023 Medical Rec #:  [id number]   Height:       70.0 in Accession #:    [id number]  Weight:       163.8 lb Date of Birth:  October 01, 1930   BSA:          1.918 m Patient Age:    89 years     BP:           113/54 mmHg Patient Gender: M           HR:           63 bpm. Exam Location:  Inpatient Procedure: 2D Echo, Cardiac Doppler, Color Doppler and Intracardiac            Opacification Agent Indications:    Bacteremia R78.81  History:        Patient has prior history of Echocardiogram examinations, most                 recent 01/14/2022. CAD; Risk Factors:Dyslipidemia and                 Hypertension.  Sonographer:    Harriette Bouillon RDCS Referring Phys: [id number] [facility] IMPRESSIONS  1. Left ventricular ejection fraction, by estimation, is 50 to 55%. The left ventricle has low normal function. The left ventricle has no regional wall motion abnormalities. The left ventricular internal cavity size was mildly dilated. Left ventricular diastolic parameters are indeterminate.  2. Right ventricular systolic function is normal. The right ventricular  size is mildly enlarged. There is moderately elevated pulmonary artery systolic pressure.  3. Left atrial size was severely dilated.  4. Right atrial size was severely dilated.  5. The mitral valve is normal in structure. Mild mitral valve regurgitation. No evidence of mitral stenosis.  6. The aortic valve is tricuspid. Aortic valve regurgitation is mild. No aortic stenosis is present.  7. The inferior vena cava is dilated in size with <50% respiratory variability, suggesting right atrial pressure of 15 mmHg. FINDINGS  Left Ventricle: Left ventricular ejection fraction, by estimation, is 50 to 55%. The left ventricle has low normal function. The left ventricle has no regional wall motion abnormalities. Definity contrast agent was given IV to delineate the left ventricular endocardial borders. The left ventricular internal cavity size was mildly dilated. There is no left ventricular hypertrophy. Left ventricular diastolic parameters are indeterminate. Right Ventricle: The right ventricular size is mildly enlarged. Right ventricular systolic function is normal.  There is moderately elevated pulmonary artery systolic pressure. The tricuspid regurgitant velocity is 2.88 m/s, and with an assumed right atrial pressure of 15 mmHg, the estimated right ventricular systolic pressure is 48.2 mmHg. Left Atrium: Left atrial size was severely dilated. Right Atrium: Right atrial size was severely dilated. Pericardium: There is no evidence of pericardial effusion. Mitral Valve: The mitral valve is normal in structure. Mild mitral valve regurgitation. No evidence of mitral valve stenosis. Tricuspid Valve: The tricuspid valve is normal in structure. Tricuspid valve regurgitation is mild . No evidence of tricuspid stenosis. Aortic Valve: The aortic valve is tricuspid. Aortic valve regurgitation is mild. No aortic stenosis is present. Pulmonic Valve: The pulmonic valve was normal in structure. Pulmonic valve regurgitation is trivial. No evidence of pulmonic stenosis. Aorta: The aortic root is normal in size and structure. Venous: The inferior vena cava is dilated in size with less than 50% respiratory variability, suggesting right atrial pressure of 15 mmHg. IAS/Shunts: No atrial level shunt detected by color flow Doppler. Additional Comments: A device lead is visualized.  LEFT VENTRICLE PLAX 2D LVIDd:         6.00 cm   Diastology LVIDs:         4.30 cm   LV e' medial:    9.25 cm/s LV PW:         0.70 cm   LV E/e' medial:  11.6 LV IVS:        0.70 cm   LV e' lateral:   6.53 cm/s LVOT diam:     2.00 cm   LV E/e' lateral: 16.4 LV SV:         64 LV SV Index:   34 LVOT Area:     3.14 cm  RIGHT VENTRICLE            IVC RV S prime:     7.72 cm/s  IVC diam: 2.60 cm TAPSE (M-mode): 1.8 cm LEFT ATRIUM            Index        RIGHT ATRIUM           Index LA diam:      4.90 cm  2.56 cm/m   RA Area:     26.00 cm LA Vol (A4C): 103.0 ml 53.72 ml/m  RA Volume:   82.60 ml  43.08 ml/m  AORTIC VALVE             PULMONIC VALVE LVOT Vmax:   112.00 cm/s PV Vmax:  0.83 m/s LVOT Vmean:  78.300 cm/s PV  Peak grad:  2.7 mmHg LVOT VTI:    0.205 m  AORTA Ao Root diam: 3.70 cm Ao Asc diam:  3.60 cm MITRAL VALVE                TRICUSPID VALVE MV Area (PHT): 4.40 cm     TR Peak grad:   33.2 mmHg MV E velocity: 107.00 cm/s  TR Vmax:        288.00 cm/s MV A velocity: 39.80 cm/s MV E/A ratio:  2.69         SHUNTS                             Systemic VTI:  0.20 m                             Systemic Diam: 2.00 cm Olga Millers MD Electronically signed by Olga Millers MD Signature Date/Time: 08/20/2023/2:49:12 PM    Final    DG Foot Complete Left  Result Date: 08/20/2023 CLINICAL DATA:  Osteomyelitis EXAM: LEFT FOOT - COMPLETE 3+ VIEW COMPARISON:  None Available. FINDINGS: There is fragmentation of the distal phalanx of the fourth toe. There is no other evidence of acute osseous abnormality in the foot. There is no other osseous erosion or destruction. Bony alignment is normal. There is mild degenerative change about the midfoot and mild inferior calcaneal spurring. There is no soft tissue gas or radiopaque foreign body. IMPRESSION: Fragmentation of the fourth distal phalanx may reflect fracture, osteomyelitis, or combination. Correlate with physical exam and consider MRI as indicated. Electronically Signed   By: Lesia Hausen M.D.   On: 08/20/2023 13:31    I have personally spent 51 minutes involved in face-to-face and non-face-to-face activities for this patient on the day of the visit. Professional time spent includes the following activities: Preparing to see the patient (review of tests), Obtaining and/or reviewing separately obtained history (admission/discharge record), Performing a medically appropriate examination and/or evaluation , Ordering medications/tests/procedures, referring and communicating with other health care professionals, Documenting clinical information in the EMR, Independently interpreting results (not separately reported), Communicating results to the patient/family/caregiver, Counseling and  educating the patient/family/caregiver and Care coordination (not separately reported).   Plan d/w requesting provider as well as ID pharm D  Note: This document was prepared using dragon voice recognition software and may include unintentional dictation errors.   Electronically signed by:   Odette Fraction, MD Infectious Disease Physician [facility] Pager: [phone]

## 2023-08-21 NOTE — Progress Notes (Signed)
PHARMACY CONSULT NOTE FOR:  OUTPATIENT  PARENTERAL ANTIBIOTIC THERAPY (OPAT)  Indication: Infective Endocarditis - Enterococcus faecalis Regimen: Ampicillin 8 g IV daily as continuous infusion and ceftriaxone 2 g IV q12h End date: 09/30/2023  IV antibiotic discharge orders are pended. To discharging provider:  please sign these orders via discharge navigator,  Select New Orders & click on the button choice - Manage This Unsigned Work.    Thank you for allowing pharmacy to be a part of this patient's care.  Lora Paula, PharmD PGY-2 Infectious Diseases Pharmacy Resident 08/21/2023 12:16 PM

## 2023-08-21 NOTE — TOC Progression Note (Signed)
Transition of Care [facility]) - Progression Note    Patient Details  Name: Kevin Collins MRN: [id number] Date of Birth: 05-06-1930  Transition of Care [facility]) CM/SW Contact  Janae Bridgeman, RN Phone Number: 08/21/2023, 2:54 PM  Clinical Narrative:    Palliative Care met with the patient today and asked that Outpatient palliative care referral be placed.  I spoke with the patient at the bedside and patient states that he did not want Outpatient palliative Care at this time and did not want an agency coming in his home or calling his home nor cell phone.  No other TOC needs at this time.  Patient plans to discharge back home with his two sons when he is medically stable for discharge.     Barriers to Discharge: Continued Medical Work up  Expected Discharge Plan and Services                                               Social Determinants of Health (SDOH) Interventions SDOH Screenings   Food Insecurity: No Food Insecurity (08/18/2023)  Housing: Low Risk  (08/18/2023)  Transportation Needs: No Transportation Needs (08/18/2023)  Utilities: Not At Risk (08/18/2023)  Tobacco Use: Medium Risk (08/18/2023)    Readmission Risk Interventions    08/21/2023    2:54 PM  Readmission Risk Prevention Plan  Transportation Screening Complete  PCP or Specialist Appt within 5-7 Days Complete  Home Care Screening Complete  Medication Review (RN CM) Complete

## 2023-08-21 NOTE — Plan of Care (Signed)
A/ox4 and on room air. No complaints of pain this shift. Up with self care, calls when help is needed. Remains on iv antibiotics. NPO since midnight for TEE. Vitals stable. Zofran x1 given for nausea and dry heaving.    Problem: Education: Goal: Knowledge of General Education information will improve Description: Including pain rating scale, medication(s)/side effects and non-pharmacologic comfort measures Outcome: Progressing   Problem: Health Behavior/Discharge Planning: Goal: Ability to manage health-related needs will improve Outcome: Progressing   Problem: Clinical Measurements: Goal: Ability to maintain clinical measurements within normal limits will improve Outcome: Progressing Goal: Will remain free from infection Outcome: Progressing Goal: Diagnostic test results will improve Outcome: Progressing Goal: Respiratory complications will improve Outcome: Progressing Goal: Cardiovascular complication will be avoided Outcome: Progressing   Problem: Activity: Goal: Risk for activity intolerance will decrease Outcome: Progressing   Problem: Nutrition: Goal: Adequate nutrition will be maintained Outcome: Progressing   Problem: Coping: Goal: Level of anxiety will decrease Outcome: Progressing   Problem: Elimination: Goal: Will not experience complications related to bowel motility Outcome: Progressing Goal: Will not experience complications related to urinary retention Outcome: Progressing   Problem: Pain Managment: Goal: General experience of comfort will improve Outcome: Progressing   Problem: Safety: Goal: Ability to remain free from injury will improve Outcome: Progressing   Problem: Skin Integrity: Goal: Risk for impaired skin integrity will decrease Outcome: Progressing   Problem: Fluid Volume: Goal: Hemodynamic stability will improve Outcome: Progressing   Problem: Clinical Measurements: Goal: Diagnostic test results will improve Outcome:  Progressing Goal: Signs and symptoms of infection will decrease Outcome: Progressing   Problem: Respiratory: Goal: Ability to maintain adequate ventilation will improve Outcome: Progressing

## 2023-08-22 ENCOUNTER — Other Ambulatory Visit: Payer: Self-pay

## 2023-08-22 ENCOUNTER — Inpatient Hospital Stay (HOSPITAL_COMMUNITY): Payer: Medicare PPO

## 2023-08-22 DIAGNOSIS — A419 Sepsis, unspecified organism: Secondary | ICD-10-CM | POA: Diagnosis not present

## 2023-08-22 LAB — BASIC METABOLIC PANEL
Anion gap: 11 (ref 5–15)
BUN: 22 mg/dL (ref 8–23)
CO2: 21 mmol/L — ABNORMAL LOW (ref 22–32)
Calcium: 7.5 mg/dL — ABNORMAL LOW (ref 8.9–10.3)
Chloride: 102 mmol/L (ref 98–111)
Creatinine, Ser: 1.33 mg/dL — ABNORMAL HIGH (ref 0.61–1.24)
GFR, Estimated: 50 mL/min — ABNORMAL LOW (ref >60)
Glucose, Bld: 89 mg/dL (ref 70–99)
Potassium: 3.8 mmol/L (ref 3.5–5.1)
Sodium: 134 mmol/L — ABNORMAL LOW (ref 135–145)

## 2023-08-22 LAB — CBC
HCT: 27.2 % — ABNORMAL LOW (ref 39.0–52.0)
Hemoglobin: 8.8 g/dL — ABNORMAL LOW (ref 13.0–17.0)
MCH: 30.6 pg (ref 26.0–34.0)
MCHC: 32.4 g/dL (ref 30.0–36.0)
MCV: 94.4 fL (ref 80.0–100.0)
Platelets: 139 10*3/uL — ABNORMAL LOW (ref 150–400)
RBC: 2.88 MIL/uL — ABNORMAL LOW (ref 4.22–5.81)
RDW: 12.2 % (ref 11.5–15.5)
WBC: 7.8 10*3/uL (ref 4.0–10.5)
nRBC: 0.3 % — ABNORMAL HIGH (ref 0.0–0.2)

## 2023-08-22 MED ORDER — LOPERAMIDE HCL 2 MG PO CAPS: 2.0000 mg | ORAL_CAPSULE | Freq: Four times a day (QID) | ORAL | 0 refills | Status: DC | PRN

## 2023-08-22 MED ORDER — APIXABAN 2.5 MG PO TABS
2.5000 mg | ORAL_TABLET | Freq: Two times a day (BID) | ORAL | Status: DC
  Administered 2023-08-22 – 2023-08-23 (×2): 2.5 mg via ORAL
  Filled 2023-08-22 (×2): qty 1

## 2023-08-22 MED ORDER — GABAPENTIN 800 MG PO TABS: 800.0000 mg | ORAL_TABLET | Freq: Every day | ORAL | Status: DC

## 2023-08-22 MED ORDER — SODIUM CHLORIDE 0.9% FLUSH: 10.0000 mL | INTRAVENOUS | Status: DC | PRN

## 2023-08-22 MED ORDER — CEFTRIAXONE IV (FOR PTA / DISCHARGE USE ONLY): 2.0000 g | Freq: Two times a day (BID) | INTRAVENOUS | 0 refills | Status: DC

## 2023-08-22 MED ORDER — CHLORHEXIDINE GLUCONATE CLOTH 2 % EX PADS
6.0000 | MEDICATED_PAD | Freq: Every day | CUTANEOUS | Status: DC
  Administered 2023-08-22 – 2023-08-23 (×2): 6 via TOPICAL

## 2023-08-22 MED ORDER — SODIUM CHLORIDE 0.9% FLUSH
10.0000 mL | Freq: Two times a day (BID) | INTRAVENOUS | Status: DC
  Administered 2023-08-22 – 2023-08-23 (×3): 10 mL

## 2023-08-22 MED ORDER — AMPICILLIN IV (FOR PTA / DISCHARGE USE ONLY): 8.0000 g | INTRAVENOUS | 0 refills | Status: DC

## 2023-08-22 NOTE — TOC Transition Note (Addendum)
Transition of Care [facility]) - CM/SW Discharge Note   Patient Details  Name: Kevin Collins MRN: [id number] Date of Birth: [birth year]  [facility] CM/SW Contact:  Lawerance Sabal, RN Phone Number: 08/22/2023, 8:16 AM   Clinical Narrative:      Notified by attending that patient will be getting PICC today and plan is for home w [facility] services and IV Abx as soon as PICC is done.  Spoke w patient he is agreeable to [facility] services, no preference for provider. He states that he has support from 2 adult children that live with him.  Referral made to [facility] and [facility] for [facility] services   11:15 Notified attending and nurse that [facility] has pointed out that patient needs a DL PICC for continuous infusion with intermittent meds.  PICC will need switched out before DC. MD states he has placed order. Nurse added to secure chat   13:30 Notified by Myna Bright RN IV Team, that it may be tomorrow before PICC line can be corrected.   14:30 Although DC order written, team, including attending and nurse are aware by secure chat that the meds will not be ready until tomorrow and patient cannot discharge until tomorrow.   For Sunday 08/23/23 patient will need noon dose of ampicillin then DC home. [facility] nurse will be at the home tomorrow between 4-5pm to teach the patient and sons how to hook up the continuous ampicillin, and how to administer the Rocephin.  Please have patient ready to DC Sunday after noon ampicillin.        Final next level of care: Home w Home Health Services Barriers to Discharge: Continued Medical Work up   Patient Goals and CMS Choice CMS Medicare.gov Compare Post Acute Care list provided to:: Patient Choice offered to / list presented to : Patient  Discharge Placement                         Discharge Plan and Services Additional resources added to the After Visit Summary for                            [facility] Arranged: RN, IV Antibiotics HH Agency: [facility]  Health Care Date [facility] Agency Contacted: 08/22/23 Time HH Agency Contacted: [phone] Representative spoke with at [facility] Agency: Kandee Keen and Pam  Social Determinants of Health (SDOH) Interventions SDOH Screenings   Food Insecurity: No Food Insecurity (08/18/2023)  Housing: Low Risk  (08/18/2023)  Transportation Needs: No Transportation Needs (08/18/2023)  Utilities: Not At Risk (08/18/2023)  Tobacco Use: Medium Risk (08/18/2023)     Readmission Risk Interventions    08/21/2023    2:54 PM  Readmission Risk Prevention Plan  Transportation Screening Complete  PCP or Specialist Appt within 5-7 Days Complete  Home Care Screening Complete  Medication Review (RN CM) Complete

## 2023-08-22 NOTE — Progress Notes (Signed)
Son at bedside voiced concerns re pt going home with 2 other sons.  States one is an alcoholic and the other son is not understanding of aseptic technique to be able to maintain the PICC routine.  Conversation and concerns relayed to Dr Renford Dills and Lawerance Sabal RN CM/SW and Jolyn Lent RN.

## 2023-08-22 NOTE — Plan of Care (Signed)

## 2023-08-22 NOTE — Progress Notes (Signed)
Peripherally Inserted Central Catheter Placement  The IV Nurse has discussed with the patient and/or persons authorized to consent for the patient, the purpose of this procedure and the potential benefits and risks involved with this procedure.  The benefits include less needle sticks, lab draws from the catheter, and the patient may be discharged home with the catheter. Risks include, but not limited to, infection, bleeding, blood clot (thrombus formation), and puncture of an artery; nerve damage and irregular heartbeat and possibility to perform a PICC exchange if needed/ordered by physician.  Alternatives to this procedure were also discussed.  Bard Power PICC patient education guide, fact sheet on infection prevention and patient information card has been provided to patient /or left at bedside.    PICC Placement Documentation  PICC Single Lumen 08/22/23 Right Brachial 40 cm 0 cm (Active)  Indication for Insertion or Continuance of Line Home intravenous therapies (PICC only) 08/22/23 0904  Exposed Catheter (cm) 0 cm 08/22/23 0904  Site Assessment Clean, Dry, Intact 08/22/23 0904  Line Status Saline locked;Blood return noted 08/22/23 0904  Dressing Type Transparent;Securing device 08/22/23 0904  Dressing Status Antimicrobial disc in place;Clean, Dry, Intact 08/22/23 0904  Line Care Connections checked and tightened 08/22/23 0904  Line Adjustment (NICU/IV Team Only) No 08/22/23 0904  Dressing Intervention New dressing 08/22/23 0904  Dressing Change Due 08/29/23 08/22/23 0904       Burnard Bunting Chenice 08/22/2023, 9:04 AM

## 2023-08-22 NOTE — Discharge Summary (Signed)
Physician Discharge Summary  Kevin Collins WUJ:[id number] DOB: 02/24/1930 DOA: 08/18/2023  PCP: Toma Deiters, MD  Admit date: 08/18/2023 Discharge date: 08/22/2023  Admitted From: Home Disposition:  Home  Discharge Condition:Stable CODE STATUS:DNR Diet recommendation:  Regular   Brief/Interim Summary:  Patient is a 89 year old male with history of systolic congestive heart failure, A-fib status post pacemaker placement, CAD status post CABG, hypertension, hyperlipidemia, peripheral neuropathy, recently treated cellulitis of the left fifth toe who presented to the emergency department with complaint of fever, weakness.  He had high-grade fever on presentation of 103F, leukocytosis of 13.6 K.  Blood pressure was soft.  Patient was started on IV fluid, broad-spectrum antibiotics .  There was suspicion for left foot infection.  Blood cultures showed Enterococcus faecalis.  ID consulted.  Started  on ampicillin, ceftriaxone.  Cardiology/orthopedics consulted , plan for conservative management with antibiotics.  No plan for for surgery or TEE.   ID recommended 6 weeks of IV antibiotics.  Plan for discharge home after PICC line placement and home health arrangement  Following problems were addressed during the hospitalization: Enterococcus faecalis bacteremia: Unclear etiology.  Suspected  left foot infection but not clear if this is the source for bacteremia.  Presented with fever, soft blood pressure.  Started on gentle IV fluid. ID following. Cardiology/EP consulted, no plan for TEE regarding his comorbidities and age.  Plan to continue antibiotics for 6 weeks.PICC line placed.Currently sepsis physiology has resolved.   Left foot cellulitis/possible osteomyelitis: Recent history of left foot cellulitis treated with antibiotics.    Continue current antibiotics for now.  Orthopedics consulted, no plan for for surgery.  Foot  x-ray showed fragmentation of fourth distal phalanx, no clear evidence of  infection   Diarrhea: Complains of frequent loose stools.  No abdominal pain, nausea or vomiting.  Could be from antibiotics. Negative GI pathogen panel, started on Imodium with  improvement   Paroxysmal A-fib: Status post pacemaker placement.  Takes Eliquis at home.At present,he is on normal sinus rhythm   Coronary artery disease: Status post CABG.  No anginal symptoms.  Not on beta-blocker.  On statin   Systolic congestive heart failure: On lisinopril, Lasix, spironolactone at home.  Currently appears euvolemic.  His echo done here shows EF of 50-55%.  Blood pressure  soft.  Will discontinue lasix,lisinopril and spironolactone   CKD stage IIIb: Unclear baseline creatinine.  Last creatinine 01/10/2022 was 1.89 before that his creatinine was ranging in the range of 1.4-1.5 .  Started on IV fluid.  Lasix on hold.  Kidney function improved, creatinine of 1.3   Hyperlipidemia: On statin, Zetia   Anemia/thrombocytopenia: Probably chronic.  Stable   Goals of care: Elderly patient, CODE STATUS DNR.  Will consult palliative  care for further discussion.  Son is interested to discuss about goals of care.  Palliaitve care recommend outpatient follow-up      Discharge Diagnoses:  Principal Problem:   Sepsis due to undetermined organism [facility]) Active Problems:   Dyslipidemia   Paroxysmal atrial fibrillation (HCC)   Peripheral neuropathy   Pain in left foot    Discharge Instructions  Discharge Instructions     [facility] pharmacist to adjust dose for Vancomycin, Aminoglycosides and other anti-infective therapies as requested by physician.   Complete by: As directed    [facility] to provide Cath Flo 2mg    Complete by: As directed    Administer for PICC line occlusion and as ordered by physician for other access device issues.  Anaphylaxis Kit: Provided to treat any anaphylactic reaction to the medication being provided to the patient if First Dose or when requested  by physician   Complete by: As directed    Epinephrine 1mg /ml vial / amp: Administer 0.3mg  (0.16ml) subcutaneously once for moderate to severe anaphylaxis, nurse to call physician and pharmacy when reaction occurs and call 911 if needed for immediate care   Diphenhydramine 50mg /ml IV vial: Administer 25-50mg  IV/IM PRN for first dose reaction, rash, itching, mild reaction, nurse to call physician and pharmacy when reaction occurs   Sodium Chloride 0.9% NS IV: Administer if needed for hypovolemic blood pressure drop or as ordered by physician after call to physician with anaphylactic reaction   Change dressing on IV access line weekly and PRN   Complete by: As directed    Diet - low sodium heart healthy   Complete by: As directed    Discharge instructions   Complete by: As directed    1)Please take prescribed medications as instructed. 2)Follow up with your PCP in a week.  Follow-up with home health 3)You have an  appointment with infectious disease on 9/25   Flush IV access with Sodium Chloride 0.9% and Heparin 10 units/ml or 100 units/ml   Complete by: As directed    Home infusion instructions - [facility]   Complete by: As directed    Instructions: Flush IV access with Sodium Chloride 0.9% and Heparin 10units/ml or 100units/ml   Change dressing on IV access line: Weekly and PRN   Instructions Cath Flo 2mg : Administer for PICC Line occlusion and as ordered by physician for other access device   [facility] pharmacist to adjust dose for: Vancomycin, Aminoglycosides and other anti-infective therapies as requested by physician   Increase activity slowly   Complete by: As directed    Method of administration may be changed at the discretion of home infusion pharmacist based upon assessment of the patient and/or caregiver's ability to self-administer the medication ordered   Complete by: As directed    No wound care   Complete by: As directed       Allergies as  of 08/22/2023   No Known Allergies      Medication List     STOP taking these medications    furosemide 20 MG tablet Commonly known as: LASIX   lisinopril 20 MG tablet Commonly known as: ZESTRIL   spironolactone 25 MG tablet Commonly known as: ALDACTONE       TAKE these medications    ampicillin IVPB Inject 8 g into the vein daily. As a continuous infusion Indication:  Endocarditis First Dose: Yes Last Day of Therapy:  09/30/2023 Labs - Once weekly:  CBC/D and BMP, Labs - Once weekly: ESR and CRP Method of administration: Ambulatory Pump (Continuous Infusion) Method of administration may be changed at the discretion of home infusion pharmacist based upon assessment of the patient and/or caregiver's ability to self-administer the medication ordered.   bimatoprost 0.01 % Soln Commonly known as: LUMIGAN Place 1 drop into both eyes at bedtime.   cefTRIAXone IVPB Commonly known as: ROCEPHIN Inject 2 g into the vein every 12 (twelve) hours. Indication:  Endocarditis First Dose: Yes Last Day of Therapy:  09/30/2023 Labs - Once weekly:  CBC/D and BMP, Labs - Once weekly: ESR and CRP Method of administration: IV Push Method of administration may be changed at the discretion of home infusion pharmacist based upon assessment of the patient and/or caregiver's ability to self-administer  the medication ordered.   cinacalcet 90 MG tablet Commonly known as: SENSIPAR Take 90 mg by mouth daily.   Eliquis 2.5 MG Tabs tablet Generic drug: apixaban TAKE 1 TABLET TWICE A DAY   ezetimibe 10 MG tablet Commonly known as: ZETIA TAKE 1 TABLET DAILY (NEED APPOINTMENT FOR FUTURE REFILLS)   gabapentin 800 MG tablet Commonly known as: NEURONTIN Take 1 tablet (800 mg total) by mouth daily. What changed: when to take this   KRILL OIL PO Take 1 capsule by mouth daily.   loperamide 2 MG capsule Commonly known as: IMODIUM Take 1 capsule (2 mg total) by mouth every 6 (six) hours as  needed for diarrhea or loose stools.   oxyCODONE-acetaminophen 7.5-325 MG tablet Commonly known as: PERCOCET Take 1 tablet by mouth 3 (three) times daily.   PreserVision AREDS Caps Take 1 capsule by mouth 2 (two) times daily.   simvastatin 80 MG tablet Commonly known as: ZOCOR TAKE 1 TABLET DAILY               Discharge Care Instructions  (From admission, onward)           Start     Ordered   08/22/23 0000  Change dressing on IV access line weekly and PRN  (Home infusion instructions - [facility] )        08/22/23 1413            Follow-up Information     Care, [facility] Follow up.   Specialty: Home Health Services Why: for home health nurse Contact information: [address] [place] [address] [phone]         [facility] Follow up.   Why: home IV antibiotic pharmacy who will supply your IV medication               No Known Allergies  Consultations: Cardiology,ID, palliative care   Procedures/Studies: [place] EKG SITE RITE  Result Date: 08/22/2023 If Site Rite image not attached, placement could not be confirmed due to current cardiac rhythm.  DG CHEST PORT 1 VIEW  Result Date: 08/22/2023 CLINICAL DATA:  S/P PICC central line placement EXAM: PORTABLE CHEST 1 VIEW COMPARISON:  CXR 08/18/23 FINDINGS: Left-sided dual lead cardiac device with unchanged lead positioning. Status post median sternotomy and CABG. Likely small left-sided pleural effusion. Cardiomegaly. Hazy bibasilar airspace opacities, left-greater-than-right, could represent atelectasis or infection. No radiographically apparent displaced rib fractures. Visualized upper abdomen is unremarkable. Right arm PICC with the tip projecting over the lower SVC. IMPRESSION: 1.  Right arm PICC with the tip projecting over the lower SVC. 2. Bibasilar airspace opacities, left-greater-than-right, could represent atelectasis or infection. Electronically Signed   By:  Lorenza Cambridge M.D.   On: 08/22/2023 09:46   [place] EKG SITE RITE  Result Date: 08/22/2023 If Site Rite image not attached, placement could not be confirmed due to current cardiac rhythm.  ECHOCARDIOGRAM COMPLETE  Result Date: 08/20/2023    ECHOCARDIOGRAM REPORT   Patient Name:   Kevin Collins Date of Exam: 08/20/2023 Medical Rec #:  [id number]   Height:       70.0 in Accession #:    [id number]  Weight:       163.8 lb Date of Birth:  May 24, 1930   BSA:          1.918 m Patient Age:    89 years    BP:           113/54 mmHg Patient Gender:  M           HR:           63 bpm. Exam Location:  Inpatient Procedure: 2D Echo, Cardiac Doppler, Color Doppler and Intracardiac            Opacification Agent Indications:    Bacteremia R78.81  History:        Patient has prior history of Echocardiogram examinations, most                 recent 01/14/2022. CAD; Risk Factors:Dyslipidemia and                 Hypertension.  Sonographer:    Harriette Bouillon RDCS Referring Phys: [id number] [facility] IMPRESSIONS  1. Left ventricular ejection fraction, by estimation, is 50 to 55%. The left ventricle has low normal function. The left ventricle has no regional wall motion abnormalities. The left ventricular internal cavity size was mildly dilated. Left ventricular diastolic parameters are indeterminate.  2. Right ventricular systolic function is normal. The right ventricular size is mildly enlarged. There is moderately elevated pulmonary artery systolic pressure.  3. Left atrial size was severely dilated.  4. Right atrial size was severely dilated.  5. The mitral valve is normal in structure. Mild mitral valve regurgitation. No evidence of mitral stenosis.  6. The aortic valve is tricuspid. Aortic valve regurgitation is mild. No aortic stenosis is present.  7. The inferior vena cava is dilated in size with <50% respiratory variability, suggesting right atrial pressure of 15 mmHg. FINDINGS  Left Ventricle: Left ventricular ejection fraction, by  estimation, is 50 to 55%. The left ventricle has low normal function. The left ventricle has no regional wall motion abnormalities. Definity contrast agent was given IV to delineate the left ventricular endocardial borders. The left ventricular internal cavity size was mildly dilated. There is no left ventricular hypertrophy. Left ventricular diastolic parameters are indeterminate. Right Ventricle: The right ventricular size is mildly enlarged. Right ventricular systolic function is normal. There is moderately elevated pulmonary artery systolic pressure. The tricuspid regurgitant velocity is 2.88 m/s, and with an assumed right atrial pressure of 15 mmHg, the estimated right ventricular systolic pressure is 48.2 mmHg. Left Atrium: Left atrial size was severely dilated. Right Atrium: Right atrial size was severely dilated. Pericardium: There is no evidence of pericardial effusion. Mitral Valve: The mitral valve is normal in structure. Mild mitral valve regurgitation. No evidence of mitral valve stenosis. Tricuspid Valve: The tricuspid valve is normal in structure. Tricuspid valve regurgitation is mild . No evidence of tricuspid stenosis. Aortic Valve: The aortic valve is tricuspid. Aortic valve regurgitation is mild. No aortic stenosis is present. Pulmonic Valve: The pulmonic valve was normal in structure. Pulmonic valve regurgitation is trivial. No evidence of pulmonic stenosis. Aorta: The aortic root is normal in size and structure. Venous: The inferior vena cava is dilated in size with less than 50% respiratory variability, suggesting right atrial pressure of 15 mmHg. IAS/Shunts: No atrial level shunt detected by color flow Doppler. Additional Comments: A device lead is visualized.  LEFT VENTRICLE PLAX 2D LVIDd:         6.00 cm   Diastology LVIDs:         4.30 cm   LV e' medial:    9.25 cm/s LV PW:         0.70 cm   LV E/e' medial:  11.6 LV IVS:        0.70 cm   LV e'  lateral:   6.53 cm/s LVOT diam:     2.00 cm    LV E/e' lateral: 16.4 LV SV:         64 LV SV Index:   34 LVOT Area:     3.14 cm  RIGHT VENTRICLE            IVC RV S prime:     7.72 cm/s  IVC diam: 2.60 cm TAPSE (M-mode): 1.8 cm LEFT ATRIUM            Index        RIGHT ATRIUM           Index LA diam:      4.90 cm  2.56 cm/m   RA Area:     26.00 cm LA Vol (A4C): 103.0 ml 53.72 ml/m  RA Volume:   82.60 ml  43.08 ml/m  AORTIC VALVE             PULMONIC VALVE LVOT Vmax:   112.00 cm/s PV Vmax:       0.83 m/s LVOT Vmean:  78.300 cm/s PV Peak grad:  2.7 mmHg LVOT VTI:    0.205 m  AORTA Ao Root diam: 3.70 cm Ao Asc diam:  3.60 cm MITRAL VALVE                TRICUSPID VALVE MV Area (PHT): 4.40 cm     TR Peak grad:   33.2 mmHg MV E velocity: 107.00 cm/s  TR Vmax:        288.00 cm/s MV A velocity: 39.80 cm/s MV E/A ratio:  2.69         SHUNTS                             Systemic VTI:  0.20 m                             Systemic Diam: 2.00 cm Olga Millers MD Electronically signed by Olga Millers MD Signature Date/Time: 08/20/2023/2:49:12 PM    Final    DG Foot Complete Left  Result Date: 08/20/2023 CLINICAL DATA:  Osteomyelitis EXAM: LEFT FOOT - COMPLETE 3+ VIEW COMPARISON:  None Available. FINDINGS: There is fragmentation of the distal phalanx of the fourth toe. There is no other evidence of acute osseous abnormality in the foot. There is no other osseous erosion or destruction. Bony alignment is normal. There is mild degenerative change about the midfoot and mild inferior calcaneal spurring. There is no soft tissue gas or radiopaque foreign body. IMPRESSION: Fragmentation of the fourth distal phalanx may reflect fracture, osteomyelitis, or combination. Correlate with physical exam and consider MRI as indicated. Electronically Signed   By: Lesia Hausen M.D.   On: 08/20/2023 13:31   DG Chest Port 1 View  Result Date: 08/18/2023 CLINICAL DATA:  Questionable sepsis - evaluate for abnormality. Fever, weakness EXAM: PORTABLE CHEST 1 VIEW COMPARISON:  01/25/2022  FINDINGS: Left side pacer remains in place, unchanged. Prior CABG. Heart and mediastinal contours within normal limits. Aortic atherosclerosis. No confluent opacities or effusions. No acute bony abnormality. IMPRESSION: No active disease. Electronically Signed   By: Charlett Nose M.D.   On: 08/18/2023 21:23   CUP PACEART REMOTE DEVICE CHECK  Result Date: 08/04/2023 Scheduled remote reviewed. Normal device function.  Within the monitoring period, HF diagnostics have been abnormal. Next remote 91 days. - CS, CVRS  Subjective: Patient seen and examined at bedside today.  Very comfortable.  Lying in bed.  Hemodynamically stable.  No new complaints.  We discussed about discharge planning to home after PICC line placement.  Son was at bedside  Discharge Exam: Vitals:   08/22/23 0400 08/22/23 0806  BP: (!) 105/54 120/69  Pulse: 61 60  Resp: 16 18  Temp: 98.4 F (36.9 C) 98.2 F (36.8 C)  SpO2: 94% 97%   Vitals:   08/21/23 2132 08/22/23 0400 08/22/23 0600 08/22/23 0806  BP: (!) 127/58 (!) 105/54  120/69  Pulse: (!) 59 61  60  Resp: 18 16  18   Temp: 98 F (36.7 C) 98.4 F (36.9 C)  98.2 F (36.8 C)  TempSrc: Oral Oral  Oral  SpO2: 96% 94%  97%  Weight:   74.5 kg   Height:        General: Pt is alert, awake, not in acute distress Cardiovascular: RRR, S1/S2 +, no rubs, no gallops Respiratory: CTA bilaterally, no wheezing, no rhonchi Abdominal: Soft, NT, ND, bowel sounds + Extremities: no edema, no cyanosis, PICC line on the right arm, healing ulcer on the lateral side of the left foot    The results of significant diagnostics from this hospitalization (including imaging, microbiology, ancillary and laboratory) are listed below for reference.     Microbiology: Recent Results (from the past 240 hour(s))  Resp panel by RT-PCR (RSV, Flu A&B, Covid) Anterior Nasal Swab     Status: None   Collection Time: 08/18/23  6:48 PM   Specimen: Anterior Nasal Swab  Result Value Ref Range  Status   SARS Coronavirus 2 by RT PCR NEGATIVE NEGATIVE Final    Comment: (NOTE) SARS-CoV-2 target nucleic acids are NOT DETECTED.  The SARS-CoV-2 RNA is generally detectable in upper respiratory specimens during the acute phase of infection. The lowest concentration of SARS-CoV-2 viral copies this assay can detect is 138 copies/mL. A negative result does not preclude SARS-Cov-2 infection and should not be used as the sole basis for treatment or other patient management decisions. A negative result may occur with  improper specimen collection/handling, submission of specimen other than nasopharyngeal swab, presence of viral mutation(s) within the areas targeted by this assay, and inadequate number of viral copies(<138 copies/mL). A negative result must be combined with clinical observations, patient history, and epidemiological information. The expected result is Negative.  Fact Sheet for Patients:  BloggerCourse.com  Fact Sheet for Healthcare Providers:  SeriousBroker.it  This test is no t yet approved or cleared by the [place] FDA and  has been authorized for detection and/or diagnosis of SARS-CoV-2 by FDA under an Emergency Use Authorization (EUA). This EUA will remain  in effect (meaning this test can be used) for the duration of the COVID-19 declaration under Section 564(b)(1) of the Act, 21 U.S.C.section 360bbb-3(b)(1), unless the authorization is terminated  or revoked sooner.       Influenza A by PCR NEGATIVE NEGATIVE Final   Influenza B by PCR NEGATIVE NEGATIVE Final    Comment: (NOTE) The Xpert Xpress SARS-CoV-2/FLU/RSV plus assay is intended as an aid in the diagnosis of influenza from Nasopharyngeal swab specimens and should not be used as a sole basis for treatment. Nasal washings and aspirates are unacceptable for Xpert Xpress SARS-CoV-2/FLU/RSV testing.  Fact Sheet for  Patients: BloggerCourse.com  Fact Sheet for Healthcare Providers: SeriousBroker.it  This test is not yet approved or cleared by the [place] FDA and has been authorized for detection  and/or diagnosis of SARS-CoV-2 by FDA under an Emergency Use Authorization (EUA). This EUA will remain in effect (meaning this test can be used) for the duration of the COVID-19 declaration under Section 564(b)(1) of the Act, 21 U.S.C. section 360bbb-3(b)(1), unless the authorization is terminated or revoked.     Resp Syncytial Virus by PCR NEGATIVE NEGATIVE Final    Comment: (NOTE) Fact Sheet for Patients: BloggerCourse.com  Fact Sheet for Healthcare Providers: SeriousBroker.it  This test is not yet approved or cleared by the [place] FDA and has been authorized for detection and/or diagnosis of SARS-CoV-2 by FDA under an Emergency Use Authorization (EUA). This EUA will remain in effect (meaning this test can be used) for the duration of the COVID-19 declaration under Section 564(b)(1) of the Act, 21 U.S.C. section 360bbb-3(b)(1), unless the authorization is terminated or revoked.  Performed at [facility], [address] [place], [place] [address]   Blood Culture (routine x 2)     Status: Abnormal   Collection Time: 08/18/23  7:09 PM   Specimen: BLOOD RIGHT ARM  Result Value Ref Range Status   Specimen Description   Final    BLOOD RIGHT ARM Performed at [facility], [address] [place], [place] [address]    Special Requests   Final    BOTTLES DRAWN AEROBIC AND ANAEROBIC Blood Culture adequate volume Performed at [facility], [address], [place], [place] [address]    Culture  Setup Time   Final    GRAM POSITIVE COCCI IN CHAINS IN BOTH AEROBIC AND ANAEROBIC BOTTLES Gram Stain Report Called to,Read Back By and Verified With: BLACKWELL,S. ON 08/19/2023 AT 0929 BY  FRATTO,A. CRITICAL RESULT CALLED TO, READ BACK BY AND VERIFIED WITH: Raynaldo Opitz [id number] AT 1412 BY CM Performed at [facility], [address] [place], [place] [address]    Culture ENTEROCOCCUS FAECALIS (A)  Final   Report Status 08/21/2023 FINAL  Final   Organism ID, Bacteria ENTEROCOCCUS FAECALIS  Final      Susceptibility   Enterococcus faecalis - MIC*    AMPICILLIN <=2 SENSITIVE Sensitive     VANCOMYCIN 1 SENSITIVE Sensitive     GENTAMICIN SYNERGY SENSITIVE Sensitive     * ENTEROCOCCUS FAECALIS  Blood Culture (routine x 2)     Status: Abnormal   Collection Time: 08/18/23  7:09 PM   Specimen: BLOOD LEFT ARM  Result Value Ref Range Status   Specimen Description   Final    BLOOD LEFT ARM Performed at [facility], [address] [place], [place] [address]    Special Requests   Final    BOTTLES DRAWN AEROBIC AND ANAEROBIC Blood Culture adequate volume Performed at [facility], [address] [place], [place] [address]    Culture  Setup Time   Final    GRAM POSITIVE COCCI IN CHAINS IN BOTH AEROBIC AND ANAEROBIC BOTTLES Gram Stain Report Called to,Read Back By and Verified With: BLACKWELL,S. ON 08/19/2023 AT 0929 BY FRATTO,A. CRITICAL VALUE NOTED.  VALUE IS CONSISTENT WITH PREVIOUSLY REPORTED AND CALLED VALUE.    Culture (A)  Final    ENTEROCOCCUS FAECALIS SUSCEPTIBILITIES PERFORMED ON PREVIOUS CULTURE WITHIN THE LAST 5 DAYS. Performed at [facility], [address], [place], [place] [address]    Report Status 08/21/2023 FINAL  Final  Blood Culture ID Panel (Reflexed)     Status: Abnormal   Collection Time: 08/18/23  7:09 PM  Result Value Ref Range  Status   Enterococcus faecalis DETECTED (A) NOT DETECTED Final    Comment: CRITICAL RESULT CALLED TO, READ BACK BY AND VERIFIED WITH: PHARMD S HURTH [id number] AT 1412 BY CM    Enterococcus Faecium NOT DETECTED NOT DETECTED Final   Listeria monocytogenes NOT DETECTED NOT DETECTED Final   Staphylococcus  species NOT DETECTED NOT DETECTED Final   Staphylococcus aureus (BCID) NOT DETECTED NOT DETECTED Final   Staphylococcus epidermidis NOT DETECTED NOT DETECTED Final   Staphylococcus lugdunensis NOT DETECTED NOT DETECTED Final   Streptococcus species NOT DETECTED NOT DETECTED Final   Streptococcus agalactiae NOT DETECTED NOT DETECTED Final   Streptococcus pneumoniae NOT DETECTED NOT DETECTED Final   Streptococcus pyogenes NOT DETECTED NOT DETECTED Final   A.calcoaceticus-baumannii NOT DETECTED NOT DETECTED Final   Bacteroides fragilis NOT DETECTED NOT DETECTED Final   Enterobacterales NOT DETECTED NOT DETECTED Final   Enterobacter cloacae complex NOT DETECTED NOT DETECTED Final   Escherichia coli NOT DETECTED NOT DETECTED Final   Klebsiella aerogenes NOT DETECTED NOT DETECTED Final   Klebsiella oxytoca NOT DETECTED NOT DETECTED Final   Klebsiella pneumoniae NOT DETECTED NOT DETECTED Final   Proteus species NOT DETECTED NOT DETECTED Final   Salmonella species NOT DETECTED NOT DETECTED Final   Serratia marcescens NOT DETECTED NOT DETECTED Final   Haemophilus influenzae NOT DETECTED NOT DETECTED Final   Neisseria meningitidis NOT DETECTED NOT DETECTED Final   Pseudomonas aeruginosa NOT DETECTED NOT DETECTED Final   Stenotrophomonas maltophilia NOT DETECTED NOT DETECTED Final   Candida albicans NOT DETECTED NOT DETECTED Final   Candida auris NOT DETECTED NOT DETECTED Final   Candida glabrata NOT DETECTED NOT DETECTED Final   Candida krusei NOT DETECTED NOT DETECTED Final   Candida parapsilosis NOT DETECTED NOT DETECTED Final   Candida tropicalis NOT DETECTED NOT DETECTED Final   Cryptococcus neoformans/gattii NOT DETECTED NOT DETECTED Final   Vancomycin resistance NOT DETECTED NOT DETECTED Final    Comment: Performed at [facility], [address], [place], [place] [address]  Culture, blood (Routine X 2) w Reflex to ID Panel     Status: None (Preliminary result)   Collection Time:  08/19/23 12:18 PM   Specimen: BLOOD  Result Value Ref Range Status   Specimen Description BLOOD LW  Final   Special Requests   Final    BOTTLES DRAWN AEROBIC AND ANAEROBIC Blood Culture adequate volume   Culture   Final    NO GROWTH 3 DAYS Performed at [facility], [address] [place], [place] [address]    Report Status PENDING  Incomplete  Culture, blood (Routine X 2) w Reflex to ID Panel     Status: None (Preliminary result)   Collection Time: 08/19/23 12:18 PM   Specimen: BLOOD  Result Value Ref Range Status   Specimen Description BLOOD BLOOD RIGHT HAND  Final   Special Requests   Final    BOTTLES DRAWN AEROBIC AND ANAEROBIC Blood Culture adequate volume   Culture   Final    NO GROWTH 3 DAYS Performed at [facility], [address] [place], [place] [address]    Report Status PENDING  Incomplete  Gastrointestinal Panel by PCR , Stool     Status: None   Collection Time: 08/20/23 10:13 AM   Specimen: Stool  Result Value Ref Range Status   Campylobacter species NOT DETECTED NOT DETECTED Final   Plesimonas shigelloides NOT DETECTED NOT DETECTED Final   Salmonella species NOT DETECTED NOT DETECTED Final  Yersinia enterocolitica NOT DETECTED NOT DETECTED Final   Vibrio species NOT DETECTED NOT DETECTED Final   Vibrio cholerae NOT DETECTED NOT DETECTED Final   Enteroaggregative E coli (EAEC) NOT DETECTED NOT DETECTED Final   Enteropathogenic E coli (EPEC) NOT DETECTED NOT DETECTED Final   Enterotoxigenic E coli (ETEC) NOT DETECTED NOT DETECTED Final   Shiga like toxin producing E coli (STEC) NOT DETECTED NOT DETECTED Final   Shigella/Enteroinvasive E coli (EIEC) NOT DETECTED NOT DETECTED Final   Cryptosporidium NOT DETECTED NOT DETECTED Final   Cyclospora cayetanensis NOT DETECTED NOT DETECTED Final   Entamoeba histolytica NOT DETECTED NOT DETECTED Final   Giardia lamblia NOT DETECTED NOT DETECTED Final   Adenovirus F40/41 NOT DETECTED NOT DETECTED Final   Astrovirus NOT  DETECTED NOT DETECTED Final   Norovirus GI/GII NOT DETECTED NOT DETECTED Final   Rotavirus A NOT DETECTED NOT DETECTED Final   Sapovirus (I, II, IV, and V) NOT DETECTED NOT DETECTED Final    Comment: Performed at [facility], [address] [place], [place] [address]     Labs: BNP (last 3 results) No results for input(s): "BNP" in the last 8760 hours. Basic Metabolic Panel: Recent Labs  Lab 08/18/23 1858 08/19/23 0417 08/20/23 0948 08/21/23 0815 08/22/23 0830  NA 130* 133* 132* 135 134*  K 4.4 4.0 3.6 3.5 3.8  CL 98 98 101 102 102  CO2 23 22 19* 19* 21*  GLUCOSE 108* 103* 101* 88 89  BUN 36* 34* 29* 26* 22  CREATININE 1.95* 1.66* 1.50* 1.35* 1.33*  CALCIUM 7.3* 7.3* 7.2* 7.3* 7.5*   Liver Function Tests: Recent Labs  Lab 08/18/23 1858  AST 15  ALT 16  ALKPHOS 46  BILITOT 0.9  PROT 6.3*  ALBUMIN 3.4*   No results for input(s): "LIPASE", "AMYLASE" in the last 168 hours. No results for input(s): "AMMONIA" in the last 168 hours. CBC: Recent Labs  Lab 08/18/23 1858 08/19/23 0417 08/20/23 0948 08/22/23 0830  WBC 13.6* 13.0* 9.3 7.8  NEUTROABS 11.5*  --   --   --   HGB 9.2* 8.1* 8.8* 8.8*  HCT 28.4* 25.3* 27.7* 27.2*  MCV 93.7 95.1 95.2 94.4  PLT 148* 127* 114* 139*   Cardiac Enzymes: No results for input(s): "CKTOTAL", "CKMB", "CKMBINDEX", "TROPONINI" in the last 168 hours. BNP: Invalid input(s): "POCBNP" CBG: No results for input(s): "GLUCAP" in the last 168 hours. D-Dimer No results for input(s): "DDIMER" in the last 72 hours. Hgb A1c No results for input(s): "HGBA1C" in the last 72 hours. Lipid Profile No results for input(s): "CHOL", "HDL", "LDLCALC", "TRIG", "CHOLHDL", "LDLDIRECT" in the last 72 hours. Thyroid function studies No results for input(s): "TSH", "T4TOTAL", "T3FREE", "THYROIDAB" in the last 72 hours.  Invalid input(s): "FREET3" Anemia work up No results for input(s): "VITAMINB12", "FOLATE", "FERRITIN", "TIBC", "IRON",  "RETICCTPCT" in the last 72 hours. Urinalysis    Component Value Date/Time   COLORURINE YELLOW 08/18/2023 2150   APPEARANCEUR CLEAR 08/18/2023 2150   LABSPEC 1.018 08/18/2023 2150   PHURINE 5.0 08/18/2023 2150   GLUCOSEU NEGATIVE 08/18/2023 2150   HGBUR NEGATIVE 08/18/2023 2150   BILIRUBINUR NEGATIVE 08/18/2023 2150   KETONESUR 5 (A) 08/18/2023 2150   PROTEINUR 30 (A) 08/18/2023 2150   NITRITE NEGATIVE 08/18/2023 2150   LEUKOCYTESUR NEGATIVE 08/18/2023 2150   Sepsis Labs Recent Labs  Lab 08/18/23 1858 08/19/23 0417 08/20/23 0948 08/22/23 0830  WBC 13.6* 13.0* 9.3 7.8   Microbiology Recent Results (from the past 240 hour(s))  Resp panel by RT-PCR (RSV, Flu A&B, Covid) Anterior Nasal Swab     Status: None   Collection Time: 08/18/23  6:48 PM   Specimen: Anterior Nasal Swab  Result Value Ref Range Status   SARS Coronavirus 2 by RT PCR NEGATIVE NEGATIVE Final    Comment: (NOTE) SARS-CoV-2 target nucleic acids are NOT DETECTED.  The SARS-CoV-2 RNA is generally detectable in upper respiratory specimens during the acute phase of infection. The lowest concentration of SARS-CoV-2 viral copies this assay can detect is 138 copies/mL. A negative result does not preclude SARS-Cov-2 infection and should not be used as the sole basis for treatment or other patient management decisions. A negative result may occur with  improper specimen collection/handling, submission of specimen other than nasopharyngeal swab, presence of viral mutation(s) within the areas targeted by this assay, and inadequate number of viral copies(<138 copies/mL). A negative result must be combined with clinical observations, patient history, and epidemiological information. The expected result is Negative.  Fact Sheet for Patients:  BloggerCourse.com  Fact Sheet for Healthcare Providers:  SeriousBroker.it  This test is no t yet approved or cleared by the  [place] FDA and  has been authorized for detection and/or diagnosis of SARS-CoV-2 by FDA under an Emergency Use Authorization (EUA). This EUA will remain  in effect (meaning this test can be used) for the duration of the COVID-19 declaration under Section 564(b)(1) of the Act, 21 U.S.C.section 360bbb-3(b)(1), unless the authorization is terminated  or revoked sooner.       Influenza A by PCR NEGATIVE NEGATIVE Final   Influenza B by PCR NEGATIVE NEGATIVE Final    Comment: (NOTE) The Xpert Xpress SARS-CoV-2/FLU/RSV plus assay is intended as an aid in the diagnosis of influenza from Nasopharyngeal swab specimens and should not be used as a sole basis for treatment. Nasal washings and aspirates are unacceptable for Xpert Xpress SARS-CoV-2/FLU/RSV testing.  Fact Sheet for Patients: BloggerCourse.com  Fact Sheet for Healthcare Providers: SeriousBroker.it  This test is not yet approved or cleared by the [place] FDA and has been authorized for detection and/or diagnosis of SARS-CoV-2 by FDA under an Emergency Use Authorization (EUA). This EUA will remain in effect (meaning this test can be used) for the duration of the COVID-19 declaration under Section 564(b)(1) of the Act, 21 U.S.C. section 360bbb-3(b)(1), unless the authorization is terminated or revoked.     Resp Syncytial Virus by PCR NEGATIVE NEGATIVE Final    Comment: (NOTE) Fact Sheet for Patients: BloggerCourse.com  Fact Sheet for Healthcare Providers: SeriousBroker.it  This test is not yet approved or cleared by the [place] FDA and has been authorized for detection and/or diagnosis of SARS-CoV-2 by FDA under an Emergency Use Authorization (EUA). This EUA will remain in effect (meaning this test can be used) for the duration of the COVID-19 declaration under Section 564(b)(1) of the Act, 21 U.S.C. section  360bbb-3(b)(1), unless the authorization is terminated or revoked.  Performed at [facility], [address], [place], [place] [address]   Blood Culture (routine x 2)     Status: Abnormal   Collection Time: 08/18/23  7:09 PM   Specimen: BLOOD RIGHT ARM  Result Value Ref Range Status   Specimen Description   Final    BLOOD RIGHT ARM Performed at [facility], [address], [place], [place] [address]    Special Requests   Final    BOTTLES DRAWN AEROBIC AND ANAEROBIC Blood Culture adequate volume Performed at [facility], [address], [place], [place] [address]    Culture  Setup Time   Final    GRAM POSITIVE COCCI IN CHAINS IN BOTH AEROBIC AND ANAEROBIC BOTTLES Gram Stain Report Called to,Read Back By and Verified With: BLACKWELL,S. ON 08/19/2023 AT 0929 BY FRATTO,A. CRITICAL RESULT CALLED TO, READ BACK BY AND VERIFIED WITH: Raynaldo Opitz [id number] AT 1412 BY CM Performed at [facility], [address] [place], [place] [address]    Culture ENTEROCOCCUS FAECALIS (A)  Final   Report Status 08/21/2023 FINAL  Final   Organism ID, Bacteria ENTEROCOCCUS FAECALIS  Final      Susceptibility   Enterococcus faecalis - MIC*    AMPICILLIN <=2 SENSITIVE Sensitive     VANCOMYCIN 1 SENSITIVE Sensitive     GENTAMICIN SYNERGY SENSITIVE Sensitive     * ENTEROCOCCUS FAECALIS  Blood Culture (routine x 2)     Status: Abnormal   Collection Time: 08/18/23  7:09 PM   Specimen: BLOOD LEFT ARM  Result Value Ref Range Status   Specimen Description   Final    BLOOD LEFT ARM Performed at [facility], [address], [place], [place] [address]    Special Requests   Final    BOTTLES DRAWN AEROBIC AND ANAEROBIC Blood Culture adequate volume Performed at [facility], [address] [place], [place] [address]    Culture  Setup Time   Final    GRAM POSITIVE COCCI IN CHAINS IN BOTH AEROBIC AND ANAEROBIC BOTTLES Gram Stain Report Called to,Read Back By and Verified With: BLACKWELL,S.  ON 08/19/2023 AT 0929 BY FRATTO,A. CRITICAL VALUE NOTED.  VALUE IS CONSISTENT WITH PREVIOUSLY REPORTED AND CALLED VALUE.    Culture (A)  Final    ENTEROCOCCUS FAECALIS SUSCEPTIBILITIES PERFORMED ON PREVIOUS CULTURE WITHIN THE LAST 5 DAYS. Performed at [facility], [address] [place], [place] [address]    Report Status 08/21/2023 FINAL  Final  Blood Culture ID Panel (Reflexed)     Status: Abnormal   Collection Time: 08/18/23  7:09 PM  Result Value Ref Range Status   Enterococcus faecalis DETECTED (A) NOT DETECTED Final    Comment: CRITICAL RESULT CALLED TO, READ BACK BY AND VERIFIED WITH: PHARMD S HURTH [id number] AT 1412 BY CM    Enterococcus Faecium NOT DETECTED NOT DETECTED Final   Listeria monocytogenes NOT DETECTED NOT DETECTED Final   Staphylococcus species NOT DETECTED NOT DETECTED Final   Staphylococcus aureus (BCID) NOT DETECTED NOT DETECTED Final   Staphylococcus epidermidis NOT DETECTED NOT DETECTED Final   Staphylococcus lugdunensis NOT DETECTED NOT DETECTED Final   Streptococcus species NOT DETECTED NOT DETECTED Final   Streptococcus agalactiae NOT DETECTED NOT DETECTED Final   Streptococcus pneumoniae NOT DETECTED NOT DETECTED Final   Streptococcus pyogenes NOT DETECTED NOT DETECTED Final   A.calcoaceticus-baumannii NOT DETECTED NOT DETECTED Final   Bacteroides fragilis NOT DETECTED NOT DETECTED Final   Enterobacterales NOT DETECTED NOT DETECTED Final   Enterobacter cloacae complex NOT DETECTED NOT DETECTED Final   Escherichia coli NOT DETECTED NOT DETECTED Final   Klebsiella aerogenes NOT DETECTED NOT DETECTED Final   Klebsiella oxytoca NOT DETECTED NOT DETECTED Final   Klebsiella pneumoniae NOT DETECTED NOT DETECTED Final   Proteus species NOT DETECTED NOT DETECTED Final   Salmonella species NOT DETECTED NOT DETECTED Final   Serratia marcescens NOT DETECTED NOT DETECTED Final   Haemophilus influenzae NOT DETECTED NOT DETECTED Final   Neisseria meningitidis  NOT DETECTED NOT DETECTED  Final   Pseudomonas aeruginosa NOT DETECTED NOT DETECTED Final   Stenotrophomonas maltophilia NOT DETECTED NOT DETECTED Final   Candida albicans NOT DETECTED NOT DETECTED Final   Candida auris NOT DETECTED NOT DETECTED Final   Candida glabrata NOT DETECTED NOT DETECTED Final   Candida krusei NOT DETECTED NOT DETECTED Final   Candida parapsilosis NOT DETECTED NOT DETECTED Final   Candida tropicalis NOT DETECTED NOT DETECTED Final   Cryptococcus neoformans/gattii NOT DETECTED NOT DETECTED Final   Vancomycin resistance NOT DETECTED NOT DETECTED Final    Comment: Performed at [facility], [address], [place], [place] [address]  Culture, blood (Routine X 2) w Reflex to ID Panel     Status: None (Preliminary result)   Collection Time: 08/19/23 12:18 PM   Specimen: BLOOD  Result Value Ref Range Status   Specimen Description BLOOD LW  Final   Special Requests   Final    BOTTLES DRAWN AEROBIC AND ANAEROBIC Blood Culture adequate volume   Culture   Final    NO GROWTH 3 DAYS Performed at [facility], [address] [place], [place] [address]    Report Status PENDING  Incomplete  Culture, blood (Routine X 2) w Reflex to ID Panel     Status: None (Preliminary result)   Collection Time: 08/19/23 12:18 PM   Specimen: BLOOD  Result Value Ref Range Status   Specimen Description BLOOD BLOOD RIGHT HAND  Final   Special Requests   Final    BOTTLES DRAWN AEROBIC AND ANAEROBIC Blood Culture adequate volume   Culture   Final    NO GROWTH 3 DAYS Performed at [facility], [address] [place], [place] [address]    Report Status PENDING  Incomplete  Gastrointestinal Panel by PCR , Stool     Status: None   Collection Time: 08/20/23 10:13 AM   Specimen: Stool  Result Value Ref Range Status   Campylobacter species NOT DETECTED NOT DETECTED Final   Plesimonas shigelloides NOT DETECTED NOT DETECTED Final   Salmonella species NOT DETECTED NOT DETECTED Final    Yersinia enterocolitica NOT DETECTED NOT DETECTED Final   Vibrio species NOT DETECTED NOT DETECTED Final   Vibrio cholerae NOT DETECTED NOT DETECTED Final   Enteroaggregative E coli (EAEC) NOT DETECTED NOT DETECTED Final   Enteropathogenic E coli (EPEC) NOT DETECTED NOT DETECTED Final   Enterotoxigenic E coli (ETEC) NOT DETECTED NOT DETECTED Final   Shiga like toxin producing E coli (STEC) NOT DETECTED NOT DETECTED Final   Shigella/Enteroinvasive E coli (EIEC) NOT DETECTED NOT DETECTED Final   Cryptosporidium NOT DETECTED NOT DETECTED Final   Cyclospora cayetanensis NOT DETECTED NOT DETECTED Final   Entamoeba histolytica NOT DETECTED NOT DETECTED Final   Giardia lamblia NOT DETECTED NOT DETECTED Final   Adenovirus F40/41 NOT DETECTED NOT DETECTED Final   Astrovirus NOT DETECTED NOT DETECTED Final   Norovirus GI/GII NOT DETECTED NOT DETECTED Final   Rotavirus A NOT DETECTED NOT DETECTED Final   Sapovirus (I, II, IV, and V) NOT DETECTED NOT DETECTED Final    Comment: Performed at [facility], [address], [place], [place] [address]    Please note: You were cared for by a hospitalist during your hospital stay. Once you are discharged, your primary care physician will handle any further medical issues. Please note that NO REFILLS for any discharge medications will be authorized once you are discharged, as it is imperative that you return to your primary care physician (  or establish a relationship with a primary care physician if you do not have one) for your post hospital discharge needs so that they can reassess your need for medications and monitor your lab values.    Time coordinating discharge: 40 minutes  SIGNED:   Burnadette Pop, MD  [facility] 08/22/2023, 2:23 PM Pager [phone]  If 7PM-7AM, please contact night-coverage [email or web address] [facility] Physician Discharge Summary  Kevin Collins DGU:[id number] DOB: 23-Aug-1930 DOA: 08/18/2023  PCP: Toma Deiters, MD  Admit date: 08/18/2023 Discharge date: 08/22/2023  Admitted From: Home Disposition:  Home  Discharge Condition:Stable CODE STATUS:FULL, DNR, Comfort Care Diet recommendation: Heart Healthy / Carb Modified / Regular / Dysphagia   Brief/Interim Summary:   Following problems were addressed during the hospitalization:   Discharge Diagnoses:  Principal Problem:   Sepsis due to undetermined organism [facility]) Active Problems:   Dyslipidemia   Paroxysmal atrial fibrillation (HCC)   Peripheral neuropathy   Pain in left foot    Discharge Instructions  Discharge Instructions     [facility] pharmacist to adjust dose for Vancomycin, Aminoglycosides and other anti-infective therapies as requested by physician.   Complete by: As directed    [facility] to provide Cath Flo 2mg    Complete by: As directed    Administer for PICC line occlusion and as ordered by physician for other access device issues.   Anaphylaxis Kit: Provided to treat any anaphylactic reaction to the medication being provided to the patient if First Dose or when requested by physician   Complete by: As directed    Epinephrine 1mg /ml vial / amp: Administer 0.3mg  (0.56ml) subcutaneously once for moderate to severe anaphylaxis, nurse to call physician and pharmacy when reaction occurs and call 911 if needed for immediate care   Diphenhydramine 50mg /ml IV vial: Administer 25-50mg  IV/IM PRN for first dose reaction, rash, itching, mild reaction, nurse to call physician and pharmacy when reaction occurs   Sodium Chloride 0.9% NS IV: Administer if needed for hypovolemic blood pressure drop or as ordered by physician after call to physician with anaphylactic reaction   Change dressing on IV access line weekly and PRN   Complete by: As directed    Diet - low sodium heart healthy   Complete by: As directed    Discharge instructions   Complete by: As directed    1)Please take prescribed medications  as instructed. 2)Follow up with your PCP in a week.  Follow-up with home health 3)You have an  appointment with infectious disease on 9/25   Flush IV access with Sodium Chloride 0.9% and Heparin 10 units/ml or 100 units/ml   Complete by: As directed    Home infusion instructions - [facility]   Complete by: As directed    Instructions: Flush IV access with Sodium Chloride 0.9% and Heparin 10units/ml or 100units/ml   Change dressing on IV access line: Weekly and PRN   Instructions Cath Flo 2mg : Administer for PICC Line occlusion and as ordered by physician for other access device   [facility] pharmacist to adjust dose for: Vancomycin, Aminoglycosides and other anti-infective therapies as requested by physician   Increase activity slowly   Complete by: As directed    Method of administration may be changed at the discretion of home infusion pharmacist based upon assessment of the patient and/or caregiver's ability to self-administer the medication ordered   Complete by: As directed    No wound care   Complete by: As directed  Allergies as of 08/22/2023   No Known Allergies      Medication List     STOP taking these medications    furosemide 20 MG tablet Commonly known as: LASIX   lisinopril 20 MG tablet Commonly known as: ZESTRIL   spironolactone 25 MG tablet Commonly known as: ALDACTONE       TAKE these medications    ampicillin IVPB Inject 8 g into the vein daily. As a continuous infusion Indication:  Endocarditis First Dose: Yes Last Day of Therapy:  09/30/2023 Labs - Once weekly:  CBC/D and BMP, Labs - Once weekly: ESR and CRP Method of administration: Ambulatory Pump (Continuous Infusion) Method of administration may be changed at the discretion of home infusion pharmacist based upon assessment of the patient and/or caregiver's ability to self-administer the medication ordered.   bimatoprost 0.01 % Soln Commonly known as:  LUMIGAN Place 1 drop into both eyes at bedtime.   cefTRIAXone IVPB Commonly known as: ROCEPHIN Inject 2 g into the vein every 12 (twelve) hours. Indication:  Endocarditis First Dose: Yes Last Day of Therapy:  09/30/2023 Labs - Once weekly:  CBC/D and BMP, Labs - Once weekly: ESR and CRP Method of administration: IV Push Method of administration may be changed at the discretion of home infusion pharmacist based upon assessment of the patient and/or caregiver's ability to self-administer the medication ordered.   cinacalcet 90 MG tablet Commonly known as: SENSIPAR Take 90 mg by mouth daily.   Eliquis 2.5 MG Tabs tablet Generic drug: apixaban TAKE 1 TABLET TWICE A DAY   ezetimibe 10 MG tablet Commonly known as: ZETIA TAKE 1 TABLET DAILY (NEED APPOINTMENT FOR FUTURE REFILLS)   gabapentin 800 MG tablet Commonly known as: NEURONTIN Take 1 tablet (800 mg total) by mouth daily. What changed: when to take this   KRILL OIL PO Take 1 capsule by mouth daily.   loperamide 2 MG capsule Commonly known as: IMODIUM Take 1 capsule (2 mg total) by mouth every 6 (six) hours as needed for diarrhea or loose stools.   oxyCODONE-acetaminophen 7.5-325 MG tablet Commonly known as: PERCOCET Take 1 tablet by mouth 3 (three) times daily.   PreserVision AREDS Caps Take 1 capsule by mouth 2 (two) times daily.   simvastatin 80 MG tablet Commonly known as: ZOCOR TAKE 1 TABLET DAILY               Discharge Care Instructions  (From admission, onward)           Start     Ordered   08/22/23 0000  Change dressing on IV access line weekly and PRN  (Home infusion instructions - [facility] )        08/22/23 1413            Follow-up Information     [facility] Follow up.   Specialty: Home Health Services Why: for home health nurse Contact information: [address] [place] [address] [phone]         [facility] Follow up.   Why:  home IV antibiotic pharmacy who will supply your IV medication               No Known Allergies  Consultations:    Procedures/Studies: [place] EKG SITE RITE  Result Date: 08/22/2023 If Site Rite image not attached, placement could not be confirmed due to current cardiac rhythm.  DG CHEST PORT 1 VIEW  Result Date: 08/22/2023 CLINICAL DATA:  S/P  PICC central line placement EXAM: PORTABLE CHEST 1 VIEW COMPARISON:  CXR 08/18/23 FINDINGS: Left-sided dual lead cardiac device with unchanged lead positioning. Status post median sternotomy and CABG. Likely small left-sided pleural effusion. Cardiomegaly. Hazy bibasilar airspace opacities, left-greater-than-right, could represent atelectasis or infection. No radiographically apparent displaced rib fractures. Visualized upper abdomen is unremarkable. Right arm PICC with the tip projecting over the lower SVC. IMPRESSION: 1.  Right arm PICC with the tip projecting over the lower SVC. 2. Bibasilar airspace opacities, left-greater-than-right, could represent atelectasis or infection. Electronically Signed   By: Lorenza Cambridge M.D.   On: 08/22/2023 09:46   [place] EKG SITE RITE  Result Date: 08/22/2023 If Site Rite image not attached, placement could not be confirmed due to current cardiac rhythm.  ECHOCARDIOGRAM COMPLETE  Result Date: 08/20/2023    ECHOCARDIOGRAM REPORT   Patient Name:   Kevin Collins Date of Exam: 08/20/2023 Medical Rec #:  [id number]   Height:       70.0 in Accession #:    [id number]  Weight:       163.8 lb Date of Birth:  08/24/1930   BSA:          1.918 m Patient Age:    89 years    BP:           113/54 mmHg Patient Gender: M           HR:           63 bpm. Exam Location:  Inpatient Procedure: 2D Echo, Cardiac Doppler, Color Doppler and Intracardiac            Opacification Agent Indications:    Bacteremia R78.81  History:        Patient has prior history of Echocardiogram examinations, most                 recent 01/14/2022. CAD; Risk  Factors:Dyslipidemia and                 Hypertension.  Sonographer:    Harriette Bouillon RDCS Referring Phys: [id number] [facility] IMPRESSIONS  1. Left ventricular ejection fraction, by estimation, is 50 to 55%. The left ventricle has low normal function. The left ventricle has no regional wall motion abnormalities. The left ventricular internal cavity size was mildly dilated. Left ventricular diastolic parameters are indeterminate.  2. Right ventricular systolic function is normal. The right ventricular size is mildly enlarged. There is moderately elevated pulmonary artery systolic pressure.  3. Left atrial size was severely dilated.  4. Right atrial size was severely dilated.  5. The mitral valve is normal in structure. Mild mitral valve regurgitation. No evidence of mitral stenosis.  6. The aortic valve is tricuspid. Aortic valve regurgitation is mild. No aortic stenosis is present.  7. The inferior vena cava is dilated in size with <50% respiratory variability, suggesting right atrial pressure of 15 mmHg. FINDINGS  Left Ventricle: Left ventricular ejection fraction, by estimation, is 50 to 55%. The left ventricle has low normal function. The left ventricle has no regional wall motion abnormalities. Definity contrast agent was given IV to delineate the left ventricular endocardial borders. The left ventricular internal cavity size was mildly dilated. There is no left ventricular hypertrophy. Left ventricular diastolic parameters are indeterminate. Right Ventricle: The right ventricular size is mildly enlarged. Right ventricular systolic function is normal. There is moderately elevated pulmonary artery systolic pressure. The tricuspid regurgitant velocity is 2.88 m/s, and with an assumed right atrial pressure of 15 mmHg,  the estimated right ventricular systolic pressure is 48.2 mmHg. Left Atrium: Left atrial size was severely dilated. Right Atrium: Right atrial size was severely dilated. Pericardium: There is no  evidence of pericardial effusion. Mitral Valve: The mitral valve is normal in structure. Mild mitral valve regurgitation. No evidence of mitral valve stenosis. Tricuspid Valve: The tricuspid valve is normal in structure. Tricuspid valve regurgitation is mild . No evidence of tricuspid stenosis. Aortic Valve: The aortic valve is tricuspid. Aortic valve regurgitation is mild. No aortic stenosis is present. Pulmonic Valve: The pulmonic valve was normal in structure. Pulmonic valve regurgitation is trivial. No evidence of pulmonic stenosis. Aorta: The aortic root is normal in size and structure. Venous: The inferior vena cava is dilated in size with less than 50% respiratory variability, suggesting right atrial pressure of 15 mmHg. IAS/Shunts: No atrial level shunt detected by color flow Doppler. Additional Comments: A device lead is visualized.  LEFT VENTRICLE PLAX 2D LVIDd:         6.00 cm   Diastology LVIDs:         4.30 cm   LV e' medial:    9.25 cm/s LV PW:         0.70 cm   LV E/e' medial:  11.6 LV IVS:        0.70 cm   LV e' lateral:   6.53 cm/s LVOT diam:     2.00 cm   LV E/e' lateral: 16.4 LV SV:         64 LV SV Index:   34 LVOT Area:     3.14 cm  RIGHT VENTRICLE            IVC RV S prime:     7.72 cm/s  IVC diam: 2.60 cm TAPSE (M-mode): 1.8 cm LEFT ATRIUM            Index        RIGHT ATRIUM           Index LA diam:      4.90 cm  2.56 cm/m   RA Area:     26.00 cm LA Vol (A4C): 103.0 ml 53.72 ml/m  RA Volume:   82.60 ml  43.08 ml/m  AORTIC VALVE             PULMONIC VALVE LVOT Vmax:   112.00 cm/s PV Vmax:       0.83 m/s LVOT Vmean:  78.300 cm/s PV Peak grad:  2.7 mmHg LVOT VTI:    0.205 m  AORTA Ao Root diam: 3.70 cm Ao Asc diam:  3.60 cm MITRAL VALVE                TRICUSPID VALVE MV Area (PHT): 4.40 cm     TR Peak grad:   33.2 mmHg MV E velocity: 107.00 cm/s  TR Vmax:        288.00 cm/s MV A velocity: 39.80 cm/s MV E/A ratio:  2.69         SHUNTS                             Systemic VTI:  0.20 m                              Systemic Diam: 2.00 cm Olga Millers MD Electronically signed by Olga Millers MD Signature Date/Time: 08/20/2023/2:49:12 PM    Final  DG Foot Complete Left  Result Date: 08/20/2023 CLINICAL DATA:  Osteomyelitis EXAM: LEFT FOOT - COMPLETE 3+ VIEW COMPARISON:  None Available. FINDINGS: There is fragmentation of the distal phalanx of the fourth toe. There is no other evidence of acute osseous abnormality in the foot. There is no other osseous erosion or destruction. Bony alignment is normal. There is mild degenerative change about the midfoot and mild inferior calcaneal spurring. There is no soft tissue gas or radiopaque foreign body. IMPRESSION: Fragmentation of the fourth distal phalanx may reflect fracture, osteomyelitis, or combination. Correlate with physical exam and consider MRI as indicated. Electronically Signed   By: Lesia Hausen M.D.   On: 08/20/2023 13:31   DG Chest Port 1 View  Result Date: 08/18/2023 CLINICAL DATA:  Questionable sepsis - evaluate for abnormality. 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 Prior CABG. Heart and mediastinal contours within normal limits. Aortic atherosclerosis. No confluent opacities or effusions. No acute bony abnormality. IMPRESSION: No active disease. Electronically Signed   By: Charlett Nose M.D.   On: 08/18/2023 21:23   CUP PACEART REMOTE DEVICE CHECK  Result Date: 08/04/2023 Scheduled remote reviewed. Normal device function.  Within the monitoring period, HF diagnostics have been abnormal. Next remote 91 days. - CS, CVRS     Subjective:   Discharge Exam: Vitals:   08/22/23 0400 08/22/23 0806  BP: (!) 105/54 120/69  Pulse: 61 60  Resp: 16 18  Temp: 98.4 F (36.9 C) 98.2 F (36.8 C)  SpO2: 94% 97%   Vitals:   08/21/23 2132 08/22/23 0400 08/22/23 0600 08/22/23 0806  BP: (!) 127/58 (!) 105/54  120/69  Pulse: (!) 59 61  60  Resp: 18 16  18    Temp: 98 F (36.7 C) 98.4 F (36.9 C)  98.2 F (36.8 C)  TempSrc: Oral Oral  Oral  SpO2: 96% 94%  97%  Weight:   74.5 kg   Height:        General: Pt is alert, awake, not in acute distress Cardiovascular: RRR, S1/S2 +, no rubs, no gallops Respiratory: CTA bilaterally, no wheezing, no rhonchi Abdominal: Soft, NT, ND, bowel sounds + Extremities: no edema, no cyanosis    The results of significant diagnostics from this hospitalization (including imaging, microbiology, ancillary and laboratory) are listed below for reference.     Microbiology: Recent Results (from the past 240 hour(s))  Resp panel by RT-PCR (RSV, Flu A&B, Covid) Anterior Nasal Swab     Status: None   Collection Time: 08/18/23  6:48 PM   Specimen: Anterior Nasal Swab  Result Value Ref Range Status   SARS Coronavirus 2 by RT PCR NEGATIVE NEGATIVE Final    Comment: (NOTE) SARS-CoV-2 target nucleic acids are NOT DETECTED.  The SARS-CoV-2 RNA is generally detectable in upper respiratory specimens during the acute phase of infection. The lowest concentration of SARS-CoV-2 viral copies this assay can detect is 138 copies/mL. A negative result does not preclude SARS-Cov-2 infection and should not be used as the sole basis for treatment or other patient management decisions. A negative result may occur with  improper specimen collection/handling, submission of specimen other than nasopharyngeal swab, presence of viral mutation(s) within the areas targeted by this assay, and inadequate number of viral copies(<138 copies/mL). A negative result must be combined with clinical observations, patient history, and epidemiological information. The expected result is Negative.  Fact Sheet for Patients:  BloggerCourse.com  Fact Sheet for  Healthcare Providers:  SeriousBroker.it  This test is no t yet approved or cleared by the [place] and  has been authorized for  detection and/or diagnosis of SARS-CoV-2 by FDA under an Emergency Use Authorization (EUA). This EUA will remain  in effect (meaning this test can be used) for the duration of the COVID-19 declaration under Section 564(b)(1) of the Act, 21 U.S.C.section 360bbb-3(b)(1), unless the authorization is terminated  or revoked sooner.       Influenza A by PCR NEGATIVE NEGATIVE Final   Influenza B by PCR NEGATIVE NEGATIVE Final    Comment: (NOTE) The Xpert Xpress SARS-CoV-2/FLU/RSV plus assay is intended as an aid in the diagnosis of influenza from Nasopharyngeal swab specimens and should not be used as a sole basis for treatment. Nasal washings and aspirates are unacceptable for Xpert Xpress SARS-CoV-2/FLU/RSV testing.  Fact Sheet for Patients: BloggerCourse.com  Fact Sheet for Healthcare Providers: SeriousBroker.it  This test is not yet approved or cleared by the [place] FDA and has been authorized for detection and/or diagnosis of SARS-CoV-2 by FDA under an Emergency Use Authorization (EUA). This EUA will remain in effect (meaning this test can be used) for the duration of the COVID-19 declaration under Section 564(b)(1) of the Act, 21 U.S.C. section 360bbb-3(b)(1), unless the authorization is terminated or revoked.     Resp Syncytial Virus by PCR NEGATIVE NEGATIVE Final    Comment: (NOTE) Fact Sheet for Patients: BloggerCourse.com  Fact Sheet for Healthcare Providers: SeriousBroker.it  This test is not yet approved or cleared by the [place] FDA and has been authorized for detection and/or diagnosis of SARS-CoV-2 by FDA under an Emergency Use Authorization (EUA). This EUA will remain in effect (meaning this test can be used) for the duration of the COVID-19 declaration under Section 564(b)(1) of the Act, 21 U.S.C. section 360bbb-3(b)(1), unless the authorization is  terminated or revoked.  Performed at [facility], [address], [place], [place] [address]   Blood Culture (routine x 2)     Status: Abnormal   Collection Time: 08/18/23  7:09 PM   Specimen: BLOOD RIGHT ARM  Result Value Ref Range Status   Specimen Description   Final    BLOOD RIGHT ARM Performed at [facility], [address], [place], [place] [address]    Special Requests   Final    BOTTLES DRAWN AEROBIC AND ANAEROBIC Blood Culture adequate volume Performed at [facility], [address], [place], [place] [address]    Culture  Setup Time   Final    GRAM POSITIVE COCCI IN CHAINS IN BOTH AEROBIC AND ANAEROBIC BOTTLES Gram Stain Report Called to,Read Back By and Verified With: BLACKWELL,S. ON 08/19/2023 AT 0929 BY FRATTO,A. CRITICAL RESULT CALLED TO, READ BACK BY AND VERIFIED WITH: Raynaldo Opitz [id number] AT 1412 BY CM Performed at [facility], [address] [place], [place] [address]    Culture ENTEROCOCCUS FAECALIS (A)  Final   Report Status 08/21/2023 FINAL  Final   Organism ID, Bacteria ENTEROCOCCUS FAECALIS  Final      Susceptibility   Enterococcus faecalis - MIC*    AMPICILLIN <=2 SENSITIVE Sensitive     VANCOMYCIN 1 SENSITIVE Sensitive     GENTAMICIN SYNERGY SENSITIVE Sensitive     * ENTEROCOCCUS FAECALIS  Blood Culture (routine x 2)     Status: Abnormal   Collection Time: 08/18/23  7:09 PM   Specimen: BLOOD LEFT ARM  Result Value Ref Range Status  Specimen Description   Final    BLOOD LEFT ARM Performed at [facility], [address], [place], [place] [address]    Special Requests   Final    BOTTLES DRAWN AEROBIC AND ANAEROBIC Blood Culture adequate volume Performed at [facility], [address] [place], [place] [address]    Culture  Setup Time   Final    GRAM POSITIVE COCCI IN CHAINS IN BOTH AEROBIC AND ANAEROBIC BOTTLES Gram Stain Report Called to,Read Back By and Verified With: BLACKWELL,S. ON 08/19/2023 AT 0929 BY  FRATTO,A. CRITICAL VALUE NOTED.  VALUE IS CONSISTENT WITH PREVIOUSLY REPORTED AND CALLED VALUE.    Culture (A)  Final    ENTEROCOCCUS FAECALIS SUSCEPTIBILITIES PERFORMED ON PREVIOUS CULTURE WITHIN THE LAST 5 DAYS. Performed at [facility], [address] [place], [place] [address]    Report Status 08/21/2023 FINAL  Final  Blood Culture ID Panel (Reflexed)     Status: Abnormal   Collection Time: 08/18/23  7:09 PM  Result Value Ref Range Status   Enterococcus faecalis DETECTED (A) NOT DETECTED Final    Comment: CRITICAL RESULT CALLED TO, READ BACK BY AND VERIFIED WITH: PHARMD S HURTH [id number] AT 1412 BY CM    Enterococcus Faecium NOT DETECTED NOT DETECTED Final   Listeria monocytogenes NOT DETECTED NOT DETECTED Final   Staphylococcus species NOT DETECTED NOT DETECTED Final   Staphylococcus aureus (BCID) NOT DETECTED NOT DETECTED Final   Staphylococcus epidermidis NOT DETECTED NOT DETECTED Final   Staphylococcus lugdunensis NOT DETECTED NOT DETECTED Final   Streptococcus species NOT DETECTED NOT DETECTED Final   Streptococcus agalactiae NOT DETECTED NOT DETECTED Final   Streptococcus pneumoniae NOT DETECTED NOT DETECTED Final   Streptococcus pyogenes NOT DETECTED NOT DETECTED Final   A.calcoaceticus-baumannii NOT DETECTED NOT DETECTED Final   Bacteroides fragilis NOT DETECTED NOT DETECTED Final   Enterobacterales NOT DETECTED NOT DETECTED Final   Enterobacter cloacae complex NOT DETECTED NOT DETECTED Final   Escherichia coli NOT DETECTED NOT DETECTED Final   Klebsiella aerogenes NOT DETECTED NOT DETECTED Final   Klebsiella oxytoca NOT DETECTED NOT DETECTED Final   Klebsiella pneumoniae NOT DETECTED NOT DETECTED Final   Proteus species NOT DETECTED NOT DETECTED Final   Salmonella species NOT DETECTED NOT DETECTED Final   Serratia marcescens NOT DETECTED NOT DETECTED Final   Haemophilus influenzae NOT DETECTED NOT DETECTED Final   Neisseria meningitidis NOT DETECTED NOT  DETECTED Final   Pseudomonas aeruginosa NOT DETECTED NOT DETECTED Final   Stenotrophomonas maltophilia NOT DETECTED NOT DETECTED Final   Candida albicans NOT DETECTED NOT DETECTED Final   Candida auris NOT DETECTED NOT DETECTED Final   Candida glabrata NOT DETECTED NOT DETECTED Final   Candida krusei NOT DETECTED NOT DETECTED Final   Candida parapsilosis NOT DETECTED NOT DETECTED Final   Candida tropicalis NOT DETECTED NOT DETECTED Final   Cryptococcus neoformans/gattii NOT DETECTED NOT DETECTED Final   Vancomycin resistance NOT DETECTED NOT DETECTED Final    Comment: Performed at [facility], [address], [place], [place] [address]  Culture, blood (Routine X 2) w Reflex to ID Panel     Status: None (Preliminary result)   Collection Time: 08/19/23 12:18 PM   Specimen: BLOOD  Result Value Ref Range Status   Specimen Description BLOOD LW  Final   Special Requests   Final    BOTTLES DRAWN AEROBIC AND ANAEROBIC Blood Culture adequate volume   Culture   Final    NO  GROWTH 3 DAYS Performed at [facility], [address] [place], [place] [address]    Report Status PENDING  Incomplete  Culture, blood (Routine X 2) w Reflex to ID Panel     Status: None (Preliminary result)   Collection Time: 08/19/23 12:18 PM   Specimen: BLOOD  Result Value Ref Range Status   Specimen Description BLOOD BLOOD RIGHT HAND  Final   Special Requests   Final    BOTTLES DRAWN AEROBIC AND ANAEROBIC Blood Culture adequate volume   Culture   Final    NO GROWTH 3 DAYS Performed at [facility], [address], [place], [place] [address]    Report Status PENDING  Incomplete  Gastrointestinal Panel by PCR , Stool     Status: None   Collection Time: 08/20/23 10:13 AM   Specimen: Stool  Result Value Ref Range Status   Campylobacter species NOT DETECTED NOT DETECTED Final   Plesimonas shigelloides NOT DETECTED NOT DETECTED Final   Salmonella species NOT DETECTED NOT DETECTED Final   Yersinia  enterocolitica NOT DETECTED NOT DETECTED Final   Vibrio species NOT DETECTED NOT DETECTED Final   Vibrio cholerae NOT DETECTED NOT DETECTED Final   Enteroaggregative E coli (EAEC) NOT DETECTED NOT DETECTED Final   Enteropathogenic E coli (EPEC) NOT DETECTED NOT DETECTED Final   Enterotoxigenic E coli (ETEC) NOT DETECTED NOT DETECTED Final   Shiga like toxin producing E coli (STEC) NOT DETECTED NOT DETECTED Final   Shigella/Enteroinvasive E coli (EIEC) NOT DETECTED NOT DETECTED Final   Cryptosporidium NOT DETECTED NOT DETECTED Final   Cyclospora cayetanensis NOT DETECTED NOT DETECTED Final   Entamoeba histolytica NOT DETECTED NOT DETECTED Final   Giardia lamblia NOT DETECTED NOT DETECTED Final   Adenovirus F40/41 NOT DETECTED NOT DETECTED Final   Astrovirus NOT DETECTED NOT DETECTED Final   Norovirus GI/GII NOT DETECTED NOT DETECTED Final   Rotavirus A NOT DETECTED NOT DETECTED Final   Sapovirus (I, II, IV, and V) NOT DETECTED NOT DETECTED Final    Comment: Performed at [facility], [address] [place], [place] [address]     Labs: BNP (last 3 results) No results for input(s): "BNP" in the last 8760 hours. Basic Metabolic Panel: Recent Labs  Lab 08/18/23 1858 08/19/23 0417 08/20/23 0948 08/21/23 0815 08/22/23 0830  NA 130* 133* 132* 135 134*  K 4.4 4.0 3.6 3.5 3.8  CL 98 98 101 102 102  CO2 23 22 19* 19* 21*  GLUCOSE 108* 103* 101* 88 89  BUN 36* 34* 29* 26* 22  CREATININE 1.95* 1.66* 1.50* 1.35* 1.33*  CALCIUM 7.3* 7.3* 7.2* 7.3* 7.5*   Liver Function Tests: Recent Labs  Lab 08/18/23 1858  AST 15  ALT 16  ALKPHOS 46  BILITOT 0.9  PROT 6.3*  ALBUMIN 3.4*   No results for input(s): "LIPASE", "AMYLASE" in the last 168 hours. No results for input(s): "AMMONIA" in the last 168 hours. CBC: Recent Labs  Lab 08/18/23 1858 08/19/23 0417 08/20/23 0948 08/22/23 0830  WBC 13.6* 13.0* 9.3 7.8  NEUTROABS 11.5*  --   --   --   HGB 9.2* 8.1* 8.8* 8.8*  HCT  28.4* 25.3* 27.7* 27.2*  MCV 93.7 95.1 95.2 94.4  PLT 148* 127* 114* 139*   Cardiac Enzymes: No results for input(s): "CKTOTAL", "CKMB", "CKMBINDEX", "TROPONINI" in the last 168 hours. BNP: Invalid input(s): "POCBNP" CBG: No results for input(s): "GLUCAP" in the last 168 hours. D-Dimer No results for input(s): "DDIMER" in  the last 72 hours. Hgb A1c No results for input(s): "HGBA1C" in the last 72 hours. Lipid Profile No results for input(s): "CHOL", "HDL", "LDLCALC", "TRIG", "CHOLHDL", "LDLDIRECT" in the last 72 hours. Thyroid function studies No results for input(s): "TSH", "T4TOTAL", "T3FREE", "THYROIDAB" in the last 72 hours.  Invalid input(s): "FREET3" Anemia work up No results for input(s): "VITAMINB12", "FOLATE", "FERRITIN", "TIBC", "IRON", "RETICCTPCT" in the last 72 hours. Urinalysis    Component Value Date/Time   COLORURINE YELLOW 08/18/2023 2150   APPEARANCEUR CLEAR 08/18/2023 2150   LABSPEC 1.018 08/18/2023 2150   PHURINE 5.0 08/18/2023 2150   GLUCOSEU NEGATIVE 08/18/2023 2150   HGBUR NEGATIVE 08/18/2023 2150   BILIRUBINUR NEGATIVE 08/18/2023 2150   KETONESUR 5 (A) 08/18/2023 2150   PROTEINUR 30 (A) 08/18/2023 2150   NITRITE NEGATIVE 08/18/2023 2150   LEUKOCYTESUR NEGATIVE 08/18/2023 2150   Sepsis Labs Recent Labs  Lab 08/18/23 1858 08/19/23 0417 08/20/23 0948 08/22/23 0830  WBC 13.6* 13.0* 9.3 7.8   Microbiology Recent Results (from the past 240 hour(s))  Resp panel by RT-PCR (RSV, Flu A&B, Covid) Anterior Nasal Swab     Status: None   Collection Time: 08/18/23  6:48 PM   Specimen: Anterior Nasal Swab  Result Value Ref Range Status   SARS Coronavirus 2 by RT PCR NEGATIVE NEGATIVE Final    Comment: (NOTE) SARS-CoV-2 target nucleic acids are NOT DETECTED.  The SARS-CoV-2 RNA is generally detectable in upper respiratory specimens during the acute phase of infection. The lowest concentration of SARS-CoV-2 viral copies this assay can detect is 138  copies/mL. A negative result does not preclude SARS-Cov-2 infection and should not be used as the sole basis for treatment or other patient management decisions. A negative result may occur with  improper specimen collection/handling, submission of specimen other than nasopharyngeal swab, presence of viral mutation(s) within the areas targeted by this assay, and inadequate number of viral copies(<138 copies/mL). A negative result must be combined with clinical observations, patient history, and epidemiological information. The expected result is Negative.  Fact Sheet for Patients:  BloggerCourse.com  Fact Sheet for Healthcare Providers:  SeriousBroker.it  This test is no t yet approved or cleared by the [place] FDA and  has been authorized for detection and/or diagnosis of SARS-CoV-2 by FDA under an Emergency Use Authorization (EUA). This EUA will remain  in effect (meaning this test can be used) for the duration of the COVID-19 declaration under Section 564(b)(1) of the Act, 21 U.S.C.section 360bbb-3(b)(1), unless the authorization is terminated  or revoked sooner.       Influenza A by PCR NEGATIVE NEGATIVE Final   Influenza B by PCR NEGATIVE NEGATIVE Final    Comment: (NOTE) The Xpert Xpress SARS-CoV-2/FLU/RSV plus assay is intended as an aid in the diagnosis of influenza from Nasopharyngeal swab specimens and should not be used as a sole basis for treatment. Nasal washings and aspirates are unacceptable for Xpert Xpress SARS-CoV-2/FLU/RSV testing.  Fact Sheet for Patients: BloggerCourse.com  Fact Sheet for Healthcare Providers: SeriousBroker.it  This test is not yet approved or cleared by the [place] FDA and has been authorized for detection and/or diagnosis of SARS-CoV-2 by FDA under an Emergency Use Authorization (EUA). This EUA will remain in effect (meaning  this test can be used) for the duration of the COVID-19 declaration under Section 564(b)(1) of the Act, 21 U.S.C. section 360bbb-3(b)(1), unless the authorization is terminated or revoked.     Resp Syncytial Virus by PCR NEGATIVE NEGATIVE Final  Comment: (NOTE) Fact Sheet for Patients: BloggerCourse.com  Fact Sheet for Healthcare Providers: SeriousBroker.it  This test is not yet approved or cleared by the [place] FDA and has been authorized for detection and/or diagnosis of SARS-CoV-2 by FDA under an Emergency Use Authorization (EUA). This EUA will remain in effect (meaning this test can be used) for the duration of the COVID-19 declaration under Section 564(b)(1) of the Act, 21 U.S.C. section 360bbb-3(b)(1), unless the authorization is terminated or revoked.  Performed at [facility], [address] [place], [place] [address]   Blood Culture (routine x 2)     Status: Abnormal   Collection Time: 08/18/23  7:09 PM   Specimen: BLOOD RIGHT ARM  Result Value Ref Range Status   Specimen Description   Final    BLOOD RIGHT ARM Performed at [facility], [address], [place], [place] [address]    Special Requests   Final    BOTTLES DRAWN AEROBIC AND ANAEROBIC Blood Culture adequate volume Performed at [facility], [address] [place], [place] [address]    Culture  Setup Time   Final    GRAM POSITIVE COCCI IN CHAINS IN BOTH AEROBIC AND ANAEROBIC BOTTLES Gram Stain Report Called to,Read Back By and Verified With: BLACKWELL,S. ON 08/19/2023 AT 0929 BY FRATTO,A. CRITICAL RESULT CALLED TO, READ BACK BY AND VERIFIED WITH: Raynaldo Opitz [id number] AT 1412 BY CM Performed at [facility], [address] [place], [place] [address]    Culture ENTEROCOCCUS FAECALIS (A)  Final   Report Status 08/21/2023 FINAL  Final   Organism ID, Bacteria ENTEROCOCCUS FAECALIS  Final      Susceptibility   Enterococcus faecalis -  MIC*    AMPICILLIN <=2 SENSITIVE Sensitive     VANCOMYCIN 1 SENSITIVE Sensitive     GENTAMICIN SYNERGY SENSITIVE Sensitive     * ENTEROCOCCUS FAECALIS  Blood Culture (routine x 2)     Status: Abnormal   Collection Time: 08/18/23  7:09 PM   Specimen: BLOOD LEFT ARM  Result Value Ref Range Status   Specimen Description   Final    BLOOD LEFT ARM Performed at [facility], [address], [place], [place] [address]    Special Requests   Final    BOTTLES DRAWN AEROBIC AND ANAEROBIC Blood Culture adequate volume Performed at [facility], [address], [place], [place] [address]    Culture  Setup Time   Final    GRAM POSITIVE COCCI IN CHAINS IN BOTH AEROBIC AND ANAEROBIC BOTTLES Gram Stain Report Called to,Read Back By and Verified With: BLACKWELL,S. ON 08/19/2023 AT 0929 BY FRATTO,A. CRITICAL VALUE NOTED.  VALUE IS CONSISTENT WITH PREVIOUSLY REPORTED AND CALLED VALUE.    Culture (A)  Final    ENTEROCOCCUS FAECALIS SUSCEPTIBILITIES PERFORMED ON PREVIOUS CULTURE WITHIN THE LAST 5 DAYS. Performed at [facility], [address], [place], [place] [address]    Report Status 08/21/2023 FINAL  Final  Blood Culture ID Panel (Reflexed)     Status: Abnormal   Collection Time: 08/18/23  7:09 PM  Result Value Ref Range Status   Enterococcus faecalis DETECTED (A) NOT DETECTED Final    Comment: CRITICAL RESULT CALLED TO, READ BACK BY AND VERIFIED WITH: PHARMD S HURTH [id number] AT 1412 BY CM    Enterococcus Faecium NOT DETECTED NOT DETECTED Final   Listeria monocytogenes NOT DETECTED NOT DETECTED Final   Staphylococcus species NOT DETECTED NOT DETECTED Final   Staphylococcus  aureus (BCID) NOT DETECTED NOT DETECTED Final   Staphylococcus epidermidis NOT DETECTED NOT DETECTED Final   Staphylococcus lugdunensis NOT DETECTED NOT DETECTED Final   Streptococcus species NOT DETECTED NOT DETECTED Final   Streptococcus agalactiae NOT DETECTED NOT DETECTED Final   Streptococcus pneumoniae NOT  DETECTED NOT DETECTED Final   Streptococcus pyogenes NOT DETECTED NOT DETECTED Final   A.calcoaceticus-baumannii NOT DETECTED NOT DETECTED Final   Bacteroides fragilis NOT DETECTED NOT DETECTED Final   Enterobacterales NOT DETECTED NOT DETECTED Final   Enterobacter cloacae complex NOT DETECTED NOT DETECTED Final   Escherichia coli NOT DETECTED NOT DETECTED Final   Klebsiella aerogenes NOT DETECTED NOT DETECTED Final   Klebsiella oxytoca NOT DETECTED NOT DETECTED Final   Klebsiella pneumoniae NOT DETECTED NOT DETECTED Final   Proteus species NOT DETECTED NOT DETECTED Final   Salmonella species NOT DETECTED NOT DETECTED Final   Serratia marcescens NOT DETECTED NOT DETECTED Final   Haemophilus influenzae NOT DETECTED NOT DETECTED Final   Neisseria meningitidis NOT DETECTED NOT DETECTED Final   Pseudomonas aeruginosa NOT DETECTED NOT DETECTED Final   Stenotrophomonas maltophilia NOT DETECTED NOT DETECTED Final   Candida albicans NOT DETECTED NOT DETECTED Final   Candida auris NOT DETECTED NOT DETECTED Final   Candida glabrata NOT DETECTED NOT DETECTED Final   Candida krusei NOT DETECTED NOT DETECTED Final   Candida parapsilosis NOT DETECTED NOT DETECTED Final   Candida tropicalis NOT DETECTED NOT DETECTED Final   Cryptococcus neoformans/gattii NOT DETECTED NOT DETECTED Final   Vancomycin resistance NOT DETECTED NOT DETECTED Final    Comment: Performed at [facility], [address] [place], [place] [address]  Culture, blood (Routine X 2) w Reflex to ID Panel     Status: None (Preliminary result)   Collection Time: 08/19/23 12:18 PM   Specimen: BLOOD  Result Value Ref Range Status   Specimen Description BLOOD LW  Final   Special Requests   Final    BOTTLES DRAWN AEROBIC AND ANAEROBIC Blood Culture adequate volume   Culture   Final    NO GROWTH 3 DAYS Performed at [facility], [address], [place], [place] [address]    Report Status PENDING  Incomplete  Culture, blood  (Routine X 2) w Reflex to ID Panel     Status: None (Preliminary result)   Collection Time: 08/19/23 12:18 PM   Specimen: BLOOD  Result Value Ref Range Status   Specimen Description BLOOD BLOOD RIGHT HAND  Final   Special Requests   Final    BOTTLES DRAWN AEROBIC AND ANAEROBIC Blood Culture adequate volume   Culture   Final    NO GROWTH 3 DAYS Performed at [facility], [address] [place], [place] [address]    Report Status PENDING  Incomplete  Gastrointestinal Panel by PCR , Stool     Status: None   Collection Time: 08/20/23 10:13 AM   Specimen: Stool  Result Value Ref Range Status   Campylobacter species NOT DETECTED NOT DETECTED Final   Plesimonas shigelloides NOT DETECTED NOT DETECTED Final   Salmonella species NOT DETECTED NOT DETECTED Final   Yersinia enterocolitica NOT DETECTED NOT DETECTED Final   Vibrio species NOT DETECTED NOT DETECTED Final   Vibrio cholerae NOT DETECTED NOT DETECTED Final   Enteroaggregative E coli (EAEC) NOT DETECTED NOT DETECTED Final   Enteropathogenic E coli (EPEC) NOT DETECTED NOT DETECTED Final   Enterotoxigenic E coli (ETEC) NOT DETECTED NOT DETECTED Final   Shiga like toxin  producing E coli (STEC) NOT DETECTED NOT DETECTED Final   Shigella/Enteroinvasive E coli (EIEC) NOT DETECTED NOT DETECTED Final   Cryptosporidium NOT DETECTED NOT DETECTED Final   Cyclospora cayetanensis NOT DETECTED NOT DETECTED Final   Entamoeba histolytica NOT DETECTED NOT DETECTED Final   Giardia lamblia NOT DETECTED NOT DETECTED Final   Adenovirus F40/41 NOT DETECTED NOT DETECTED Final   Astrovirus NOT DETECTED NOT DETECTED Final   Norovirus GI/GII NOT DETECTED NOT DETECTED Final   Rotavirus A NOT DETECTED NOT DETECTED Final   Sapovirus (I, II, IV, and V) NOT DETECTED NOT DETECTED Final    Comment: Performed at [facility], [address], [place], [place] [address]    Please note: You were cared for by a hospitalist during your hospital stay. Once  you are discharged, your primary care physician will handle any further medical issues. Please note that NO REFILLS for any discharge medications will be authorized once you are discharged, as it is imperative that you return to your primary care physician (or establish a relationship with a primary care physician if you do not have one) for your post hospital discharge needs so that they can reassess your need for medications and monitor your lab values.    Time coordinating discharge: 40 minutes  SIGNED:   Burnadette Pop, MD  [facility] 08/22/2023, 2:23 PM Pager [phone]  If 7PM-7AM, please contact night-coverage [email or web address] Password [email or web address]

## 2023-08-23 ENCOUNTER — Inpatient Hospital Stay (HOSPITAL_COMMUNITY): Payer: Medicare PPO

## 2023-08-23 DIAGNOSIS — A419 Sepsis, unspecified organism: Secondary | ICD-10-CM | POA: Diagnosis not present

## 2023-08-23 DIAGNOSIS — D649 Anemia, unspecified: Secondary | ICD-10-CM | POA: Diagnosis not present

## 2023-08-23 DIAGNOSIS — E871 Hypo-osmolality and hyponatremia: Secondary | ICD-10-CM | POA: Diagnosis not present

## 2023-08-23 DIAGNOSIS — I088 Other rheumatic multiple valve diseases: Secondary | ICD-10-CM | POA: Diagnosis not present

## 2023-08-23 DIAGNOSIS — I38 Endocarditis, valve unspecified: Secondary | ICD-10-CM | POA: Diagnosis not present

## 2023-08-23 DIAGNOSIS — G609 Hereditary and idiopathic neuropathy, unspecified: Secondary | ICD-10-CM | POA: Diagnosis not present

## 2023-08-23 DIAGNOSIS — I119 Hypertensive heart disease without heart failure: Secondary | ICD-10-CM | POA: Diagnosis not present

## 2023-08-23 DIAGNOSIS — I480 Paroxysmal atrial fibrillation: Secondary | ICD-10-CM | POA: Diagnosis not present

## 2023-08-23 DIAGNOSIS — H9190 Unspecified hearing loss, unspecified ear: Secondary | ICD-10-CM | POA: Diagnosis not present

## 2023-08-23 DIAGNOSIS — I2510 Atherosclerotic heart disease of native coronary artery without angina pectoris: Secondary | ICD-10-CM | POA: Diagnosis not present

## 2023-08-23 MED ORDER — HEPARIN SOD (PORK) LOCK FLUSH 100 UNIT/ML IV SOLN
250.0000 [IU] | INTRAVENOUS | Status: AC | PRN
  Administered 2023-08-23: 250 [IU]

## 2023-08-23 MED ORDER — SODIUM CHLORIDE 0.9% FLUSH: 10.0000 mL | INTRAVENOUS | Status: DC | PRN

## 2023-08-23 MED ORDER — SODIUM CHLORIDE 0.9 % IV SOLN: 2.0000 g | Freq: Two times a day (BID) | INTRAVENOUS | Status: DC

## 2023-08-23 NOTE — Progress Notes (Signed)
Patient seen and examined today.  Remains comfortable.  Diarrhea has slowed  down.  He is very eager to go home.  PICC line  been changed.  Medically stable for discharge.  No new change in the medical management.  Discharge order and summary have been already placed on 9/7

## 2023-08-23 NOTE — Care Management (Addendum)
Care team notified this morning that once PICC changed to double lumen with extension tubing by IV Team this morning, the plan for IV meds for today is that he will need noon dose of ampicillin then he can DC home. [facility] nurse will be at the home tomorrow between 4-5pm to teach the patient and sons how to hook up the continuous ampicillin, and how to administer the Rocephin. Please have patient ready to DC Sunday after noon ampicillin.   Frances Furbish and [employer] notified of DC order, and Frances Furbish is familiar with patient and home from prior utilization.

## 2023-08-23 NOTE — Progress Notes (Signed)
Peripherally Inserted Central Catheter Placement  The IV Nurse has discussed with the patient and/or persons authorized to consent for the patient, the purpose of this procedure and the potential benefits and risks involved with this procedure.  The benefits include less needle sticks, lab draws from the catheter, and the patient may be discharged home with the catheter. Risks include, but not limited to, infection, bleeding, blood clot (thrombus formation), and puncture of an artery; nerve damage and irregular heartbeat and possibility to perform a PICC exchange if needed/ordered by physician.  Alternatives to this procedure were also discussed.  Bard Power PICC patient education guide, fact sheet on infection prevention and patient information card has been provided to patient /or left at bedside.    PICC Placement Documentation  PICC Double Lumen 08/23/23 Right Brachial 41 cm 0 cm (Active)  Indication for Insertion or Continuance of Line Home intravenous therapies (PICC only) 08/23/23 0810  Exposed Catheter (cm) 0 cm 08/23/23 0810  Site Assessment Clean;Dry;Bruised 08/23/23 0810  Lumen #1 Status Saline locked;Blood return noted 08/23/23 0810  Lumen #2 Status Saline locked;Blood return noted 08/23/23 0810  Dressing Type Transparent;Securing device 08/23/23 0810  Dressing Status Antimicrobial disc in place;Clean, Dry, Intact 08/23/23 0810  Line Care Connections checked and tightened 08/23/23 0810  Line Adjustment (NICU/IV Team Only) No 08/23/23 0810  Dressing Intervention New dressing 08/23/23 0810  Dressing Change Due 08/30/23 08/23/23 0810       Elliot Dally 08/23/2023, 8:24 AM

## 2023-08-23 NOTE — Plan of Care (Signed)

## 2023-08-24 ENCOUNTER — Telehealth: Payer: Self-pay

## 2023-08-24 ENCOUNTER — Ambulatory Visit: Payer: Medicare PPO | Attending: Cardiology

## 2023-08-24 DIAGNOSIS — I10 Essential (primary) hypertension: Secondary | ICD-10-CM | POA: Diagnosis not present

## 2023-08-24 DIAGNOSIS — Z6823 Body mass index (BMI) 23.0-23.9, adult: Secondary | ICD-10-CM | POA: Diagnosis not present

## 2023-08-24 DIAGNOSIS — Z95810 Presence of automatic (implantable) cardiac defibrillator: Secondary | ICD-10-CM | POA: Diagnosis not present

## 2023-08-24 DIAGNOSIS — I5042 Chronic combined systolic (congestive) and diastolic (congestive) heart failure: Secondary | ICD-10-CM

## 2023-08-24 DIAGNOSIS — M8608 Acute hematogenous osteomyelitis, other sites: Secondary | ICD-10-CM | POA: Diagnosis not present

## 2023-08-24 LAB — CULTURE, BLOOD (ROUTINE X 2)
Culture: NO GROWTH
Culture: NO GROWTH
Special Requests: ADEQUATE
Special Requests: ADEQUATE

## 2023-08-24 NOTE — Telephone Encounter (Signed)
Remote ICM transmission received.  Attempted call to patient regarding ICM remote transmission and left message to return call   

## 2023-08-24 NOTE — Progress Notes (Signed)
EPIC Encounter for ICM Monitoring  Patient Name: Rodert Kennell is a 89 y.o. male Date: 08/24/2023 Primary Care Physican: Toma Deiters, MD Primary Cardiologist: Hochrein Electrophysiologist: Townsend Roger Pacing: 94%            05/14/2023 Weight: 155 lbs  07/22/2023 Weight: 154 lbs                                                            Attempted call to patient and unable to reach.  Left detailed message per DPR regarding transmission. Transmission reviewed.  Hospitalization 9/3-9/7 with dx of sepsis and Lisinopril, Spironolactone and Furosemide stopped at time of discharge.     CorVue thoracic impedance suggesting possible fluid accumulation starting 9/4 which correlates with hospitalization 9/3 (lasix stopped).   Prescribed:  Furosemide 20 mg take 1 tablet(s) (20 mg total) by mouth daily.   Per 9/7 hospital discharge instructions, stop lasix. Spironolactone 25 mg take 1 tablet daily.                                           Per 9/7 hospital discharge instructions, stop lasix.   Labs: 01/10/2022 Creatinine 1.89, BUN 31, Potassium 4.9, Sodium 139, GFR 34 A complete set of results can be found in Results Review.   Recommendations:  Unable to reach.     Follow-up plan: ICM clinic phone appointment on 08/31/2023 to recheck fluid levels.   91 day device clinic remote transmission 11/03/2023.     EP/Cardiology Office Visits:   10/14/2023 with Dr. Lalla Brothers.  Recall 07/18/2023 with Dr Antoine Poche.     Copy of ICM check sent to Dr. Lalla Brothers and Dr Antoine Poche.      3 month ICM trend: 08/24/2023.    12-14 Month ICM trend:     Karie Soda, RN 08/24/2023 11:23 AM

## 2023-08-25 DIAGNOSIS — A419 Sepsis, unspecified organism: Secondary | ICD-10-CM | POA: Diagnosis not present

## 2023-08-25 DIAGNOSIS — I088 Other rheumatic multiple valve diseases: Secondary | ICD-10-CM | POA: Diagnosis not present

## 2023-08-25 DIAGNOSIS — D649 Anemia, unspecified: Secondary | ICD-10-CM | POA: Diagnosis not present

## 2023-08-25 DIAGNOSIS — G609 Hereditary and idiopathic neuropathy, unspecified: Secondary | ICD-10-CM | POA: Diagnosis not present

## 2023-08-25 DIAGNOSIS — I2510 Atherosclerotic heart disease of native coronary artery without angina pectoris: Secondary | ICD-10-CM | POA: Diagnosis not present

## 2023-08-25 DIAGNOSIS — I38 Endocarditis, valve unspecified: Secondary | ICD-10-CM | POA: Diagnosis not present

## 2023-08-25 DIAGNOSIS — I119 Hypertensive heart disease without heart failure: Secondary | ICD-10-CM | POA: Diagnosis not present

## 2023-08-25 DIAGNOSIS — E871 Hypo-osmolality and hyponatremia: Secondary | ICD-10-CM | POA: Diagnosis not present

## 2023-08-25 DIAGNOSIS — I480 Paroxysmal atrial fibrillation: Secondary | ICD-10-CM | POA: Diagnosis not present

## 2023-08-25 DIAGNOSIS — H9190 Unspecified hearing loss, unspecified ear: Secondary | ICD-10-CM | POA: Diagnosis not present

## 2023-08-26 DIAGNOSIS — H9190 Unspecified hearing loss, unspecified ear: Secondary | ICD-10-CM | POA: Diagnosis not present

## 2023-08-26 DIAGNOSIS — D649 Anemia, unspecified: Secondary | ICD-10-CM | POA: Diagnosis not present

## 2023-08-26 DIAGNOSIS — I480 Paroxysmal atrial fibrillation: Secondary | ICD-10-CM | POA: Diagnosis not present

## 2023-08-26 DIAGNOSIS — A419 Sepsis, unspecified organism: Secondary | ICD-10-CM | POA: Diagnosis not present

## 2023-08-26 DIAGNOSIS — I2510 Atherosclerotic heart disease of native coronary artery without angina pectoris: Secondary | ICD-10-CM | POA: Diagnosis not present

## 2023-08-26 DIAGNOSIS — E871 Hypo-osmolality and hyponatremia: Secondary | ICD-10-CM | POA: Diagnosis not present

## 2023-08-26 DIAGNOSIS — G609 Hereditary and idiopathic neuropathy, unspecified: Secondary | ICD-10-CM | POA: Diagnosis not present

## 2023-08-26 DIAGNOSIS — I119 Hypertensive heart disease without heart failure: Secondary | ICD-10-CM | POA: Diagnosis not present

## 2023-08-26 DIAGNOSIS — I088 Other rheumatic multiple valve diseases: Secondary | ICD-10-CM | POA: Diagnosis not present

## 2023-08-31 ENCOUNTER — Ambulatory Visit: Payer: Medicare PPO | Attending: Cardiology

## 2023-08-31 DIAGNOSIS — I119 Hypertensive heart disease without heart failure: Secondary | ICD-10-CM | POA: Diagnosis not present

## 2023-08-31 DIAGNOSIS — I2510 Atherosclerotic heart disease of native coronary artery without angina pectoris: Secondary | ICD-10-CM | POA: Diagnosis not present

## 2023-08-31 DIAGNOSIS — A419 Sepsis, unspecified organism: Secondary | ICD-10-CM | POA: Diagnosis not present

## 2023-08-31 DIAGNOSIS — I5042 Chronic combined systolic (congestive) and diastolic (congestive) heart failure: Secondary | ICD-10-CM

## 2023-08-31 DIAGNOSIS — I088 Other rheumatic multiple valve diseases: Secondary | ICD-10-CM | POA: Diagnosis not present

## 2023-08-31 DIAGNOSIS — I480 Paroxysmal atrial fibrillation: Secondary | ICD-10-CM | POA: Diagnosis not present

## 2023-08-31 DIAGNOSIS — E871 Hypo-osmolality and hyponatremia: Secondary | ICD-10-CM | POA: Diagnosis not present

## 2023-08-31 DIAGNOSIS — G609 Hereditary and idiopathic neuropathy, unspecified: Secondary | ICD-10-CM | POA: Diagnosis not present

## 2023-08-31 DIAGNOSIS — D649 Anemia, unspecified: Secondary | ICD-10-CM | POA: Diagnosis not present

## 2023-08-31 DIAGNOSIS — H9190 Unspecified hearing loss, unspecified ear: Secondary | ICD-10-CM | POA: Diagnosis not present

## 2023-08-31 DIAGNOSIS — Z95810 Presence of automatic (implantable) cardiac defibrillator: Secondary | ICD-10-CM

## 2023-09-01 DIAGNOSIS — I38 Endocarditis, valve unspecified: Secondary | ICD-10-CM | POA: Diagnosis not present

## 2023-09-01 NOTE — Progress Notes (Signed)
EPIC Encounter for ICM Monitoring  Patient Name: Kevin Collins is a 89 y.o. male Date: 09/01/2023 Primary Care Physican: Toma Deiters, MD Primary Cardiologist: Hochrein Electrophysiologist: Townsend Roger Pacing: 95%            05/14/2023 Weight: 155 lbs  07/22/2023 Weight: 154 lbs                                                            Spoke with patient and he is waiting on PCP to call him back due to feeling dizzy.  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  He's feet are swollen and PCP told him to restart 20 mg daily.    He is receiving IV antibiotics at home.    CorVue thoracic impedance suggesting possible fluid accumulation starting 9/4 which correlates with hospitalization 9/3 (lasix stopped).   Prescribed:  Furosemide 20 mg take 1 tablet(s) (20 mg total) by mouth daily.  Pt reported 9/17 the PCP has instructed him to resume Lasix.  Spironolactone 25 mg take 1 tablet daily.                                              Labs: 01/10/2022 Creatinine 1.89, BUN 31, Potassium 4.9, Sodium 139, GFR 34 A complete set of results can be found in Results Review.   Recommendations:  Encouraged to call Dr Jenene Slicker office for post hospital follow up visit.     Follow-up plan: ICM clinic phone appointment on 09/28/2023.   91 day device clinic remote transmission 11/03/2023.     EP/Cardiology Office Visits:   10/14/2023 with Dr. Lalla Brothers.  Recall 07/18/2023 with Dr Antoine Poche.     Copy of ICM check sent to Dr. Lalla Brothers and Dr Antoine Poche.   3 month ICM trend: 08/31/2023.    12-14 Month ICM trend:     Karie Soda, RN 09/01/2023 3:03 PM

## 2023-09-02 ENCOUNTER — Emergency Department (HOSPITAL_COMMUNITY): Payer: Medicare PPO

## 2023-09-02 ENCOUNTER — Emergency Department (HOSPITAL_COMMUNITY)
Admission: EM | Admit: 2023-09-02 | Discharge: 2023-09-02 | Disposition: A | Discharge status: Home / Self Care | Payer: Medicare PPO | Attending: Emergency Medicine | Admitting: Emergency Medicine

## 2023-09-02 ENCOUNTER — Encounter (HOSPITAL_COMMUNITY): Payer: Self-pay

## 2023-09-02 ENCOUNTER — Other Ambulatory Visit: Payer: Self-pay

## 2023-09-02 DIAGNOSIS — R609 Edema, unspecified: Secondary | ICD-10-CM | POA: Diagnosis not present

## 2023-09-02 DIAGNOSIS — I509 Heart failure, unspecified: Secondary | ICD-10-CM | POA: Diagnosis not present

## 2023-09-02 DIAGNOSIS — R5381 Other malaise: Secondary | ICD-10-CM | POA: Diagnosis not present

## 2023-09-02 DIAGNOSIS — I110 Hypertensive heart disease with heart failure: Secondary | ICD-10-CM | POA: Insufficient documentation

## 2023-09-02 DIAGNOSIS — Z7901 Long term (current) use of anticoagulants: Secondary | ICD-10-CM | POA: Diagnosis not present

## 2023-09-02 DIAGNOSIS — E861 Hypovolemia: Secondary | ICD-10-CM | POA: Diagnosis not present

## 2023-09-02 DIAGNOSIS — J90 Pleural effusion, not elsewhere classified: Secondary | ICD-10-CM | POA: Diagnosis not present

## 2023-09-02 DIAGNOSIS — E876 Hypokalemia: Secondary | ICD-10-CM | POA: Diagnosis not present

## 2023-09-02 DIAGNOSIS — R600 Localized edema: Secondary | ICD-10-CM | POA: Diagnosis not present

## 2023-09-02 DIAGNOSIS — I517 Cardiomegaly: Secondary | ICD-10-CM | POA: Diagnosis not present

## 2023-09-02 DIAGNOSIS — I2510 Atherosclerotic heart disease of native coronary artery without angina pectoris: Secondary | ICD-10-CM | POA: Diagnosis not present

## 2023-09-02 DIAGNOSIS — Z950 Presence of cardiac pacemaker: Secondary | ICD-10-CM | POA: Diagnosis not present

## 2023-09-02 DIAGNOSIS — E8770 Fluid overload, unspecified: Secondary | ICD-10-CM

## 2023-09-02 DIAGNOSIS — R0602 Shortness of breath: Secondary | ICD-10-CM | POA: Diagnosis not present

## 2023-09-02 DIAGNOSIS — Z955 Presence of coronary angioplasty implant and graft: Secondary | ICD-10-CM | POA: Insufficient documentation

## 2023-09-02 LAB — CBC WITH DIFFERENTIAL/PLATELET
Abs Immature Granulocytes: 0.07 10*3/uL (ref 0.00–0.07)
Basophils Absolute: 0.1 10*3/uL (ref 0.0–0.1)
Basophils Relative: 1 %
Eosinophils Absolute: 0.2 10*3/uL (ref 0.0–0.5)
Eosinophils Relative: 2 %
HCT: 32.8 % — ABNORMAL LOW (ref 39.0–52.0)
Hemoglobin: 10 g/dL — ABNORMAL LOW (ref 13.0–17.0)
Immature Granulocytes: 1 %
Lymphocytes Relative: 17 %
Lymphs Abs: 1.5 10*3/uL (ref 0.7–4.0)
MCH: 30.5 pg (ref 26.0–34.0)
MCHC: 30.5 g/dL (ref 30.0–36.0)
MCV: 100 fL (ref 80.0–100.0)
Monocytes Absolute: 0.9 10*3/uL (ref 0.1–1.0)
Monocytes Relative: 10 %
Neutro Abs: 6.4 10*3/uL (ref 1.7–7.7)
Neutrophils Relative %: 69 %
Platelets: 153 10*3/uL (ref 150–400)
RBC: 3.28 MIL/uL — ABNORMAL LOW (ref 4.22–5.81)
RDW: 15 % (ref 11.5–15.5)
WBC: 9.1 10*3/uL (ref 4.0–10.5)
nRBC: 0 % (ref 0.0–0.2)

## 2023-09-02 LAB — COMPREHENSIVE METABOLIC PANEL WITH GFR
ALT: 23 U/L (ref 0–44)
AST: 28 U/L (ref 15–41)
Albumin: 3.4 g/dL — ABNORMAL LOW (ref 3.5–5.0)
Alkaline Phosphatase: 42 U/L (ref 38–126)
Anion gap: 13 (ref 5–15)
BUN: 10 mg/dL (ref 8–23)
CO2: 29 mmol/L (ref 22–32)
Calcium: 7.6 mg/dL — ABNORMAL LOW (ref 8.9–10.3)
Chloride: 99 mmol/L (ref 98–111)
Creatinine, Ser: 1.09 mg/dL (ref 0.61–1.24)
GFR, Estimated: >60 mL/min (ref >60)
Glucose, Bld: 92 mg/dL (ref 70–99)
Potassium: 2.8 mmol/L — ABNORMAL LOW (ref 3.5–5.1)
Sodium: 141 mmol/L (ref 135–145)
Total Bilirubin: 0.6 mg/dL (ref 0.3–1.2)
Total Protein: 6.3 g/dL — ABNORMAL LOW (ref 6.5–8.1)

## 2023-09-02 LAB — BRAIN NATRIURETIC PEPTIDE: B Natriuretic Peptide: 635 pg/mL — ABNORMAL HIGH (ref 0.0–100.0)

## 2023-09-02 MED ORDER — POTASSIUM CHLORIDE CRYS ER 20 MEQ PO TBCR
60.0000 meq | EXTENDED_RELEASE_TABLET | Freq: Once | ORAL | Status: AC
  Administered 2023-09-02: 60 meq via ORAL
  Filled 2023-09-02: qty 3

## 2023-09-02 MED ORDER — POTASSIUM CHLORIDE CRYS ER 20 MEQ PO TBCR: 20.0000 meq | EXTENDED_RELEASE_TABLET | Freq: Two times a day (BID) | ORAL | 0 refills | Status: DC

## 2023-09-02 NOTE — ED Notes (Signed)
This nurse went into patient's room to assess patient and discuss plan of care. Patient states he wishes to leave AMA because he does not want to lay in the hospital anymore and "can administer his medications and do all of this at home". Talked with patient about risks of leaving before completion of care and offered various comfort opportunities, but patient and family insists to leave AMA. MD aware.

## 2023-09-02 NOTE — ED Triage Notes (Signed)
Pt brought in by [facility] for generalized edema. Pt states this has been going on since his previous discharge on 08/23/2023 where they discontinued his furosemide, lisinopril, and spironolactone. Pt went to PCP and was restarted on his furosemide 20 mg. Pt states having SOB with swelling.

## 2023-09-02 NOTE — ED Provider Notes (Signed)
[place] EMERGENCY DEPARTMENT AT [facility] Provider Note   CSN: [id number] Arrival date & time: 09/02/23  1618     History  Chief Complaint  Patient presents with   Fluid Overload    Kevin Collins is a 89 y.o. male.  HPI    Pt comes in with cc shortness of breath, leg swelling.  Patient has history of CHF, A-fib, CAD status post CABG, hypertension, hyperlipidemia.  Patient recently was admitted to the hospital for cellulitis.  At the time of discharge, he is Lasix was discontinued due to low blood pressure.  Patient states that ever since his Lasix were discontinued, he started retaining fluid in his legs.  He it is difficult for him to walk.  At nighttime he is having shortness of breath when laying flat.   Home Medications Prior to Admission medications   Medication Sig Start Date End Date Taking? Authorizing Provider  potassium chloride SA (KLOR-CON M) 20 MEQ tablet Take 1 tablet (20 mEq total) by mouth 2 (two) times daily. 09/02/23  Yes Cynara Tatham, Janey Genta, MD  ampicillin IVPB Inject 8 g into the vein daily. As a continuous infusion Indication:  Endocarditis First Dose: Yes Last Day of Therapy:  09/30/2023 Labs - Once weekly:  CBC/D and BMP, Labs - Once weekly: ESR and CRP Method of administration: Ambulatory Pump (Continuous Infusion) Method of administration may be changed at the discretion of home infusion pharmacist based upon assessment of the patient and/or caregiver's ability to self-administer the medication ordered. 08/22/23 10/01/23  Burnadette Pop, MD  bimatoprost (LUMIGAN) 0.01 % SOLN Place 1 drop into both eyes at bedtime.    [provider]  cefTRIAXone (ROCEPHIN) IVPB Inject 2 g into the vein every 12 (twelve) hours. Indication:  Endocarditis First Dose: Yes Last Day of Therapy:  09/30/2023 Labs - Once weekly:  CBC/D and BMP, Labs - Once weekly: ESR and CRP Method of administration: IV Push Method of administration may be changed at the  discretion of home infusion pharmacist based upon assessment of the patient and/or caregiver's ability to self-administer the medication ordered. 08/22/23 10/01/23  Burnadette Pop, MD  cinacalcet (SENSIPAR) 90 MG tablet Take 90 mg by mouth daily. 10/31/20   [provider]  ELIQUIS 2.5 MG TABS tablet TAKE 1 TABLET TWICE A DAY 12/02/22   Rollene Rotunda, MD  ezetimibe (ZETIA) 10 MG tablet TAKE 1 TABLET DAILY (NEED APPOINTMENT FOR FUTURE REFILLS) 08/10/23   Rollene Rotunda, MD  gabapentin (NEURONTIN) 800 MG tablet Take 1 tablet (800 mg total) by mouth daily. 08/22/23   Burnadette Pop, MD  KRILL OIL PO Take 1 capsule by mouth daily.    [provider]  loperamide (IMODIUM) 2 MG capsule Take 1 capsule (2 mg total) by mouth every 6 (six) hours as needed for diarrhea or loose stools. 08/22/23   Burnadette Pop, MD  Multiple Vitamins-Minerals (PRESERVISION AREDS) CAPS Take 1 capsule by mouth 2 (two) times daily.    [provider]  oxyCODONE-acetaminophen (PERCOCET) 7.5-325 MG tablet Take 1 tablet by mouth 3 (three) times daily.    [provider]  simvastatin (ZOCOR) 80 MG tablet TAKE 1 TABLET DAILY 08/11/23   Rollene Rotunda, MD      Allergies    Patient has no known allergies.    Review of Systems   Review of Systems  All other systems reviewed and are negative.   Physical Exam Updated Vital Signs BP 124/80 (BP Location: Left Arm)   Pulse 62  Temp 97.9 F (36.6 C) (Oral)   Resp 15   Ht 5\' 10"  (1.778 m)   Wt 76.5 kg   SpO2 95%   BMI 24.20 kg/m  Physical Exam Vitals and nursing note reviewed.  Constitutional:      Appearance: He is well-developed.  HENT:     Head: Atraumatic.  Cardiovascular:     Rate and Rhythm: Normal rate.  Pulmonary:     Effort: Pulmonary effort is normal.     Comments: Bibasilar rales Musculoskeletal:     Cervical back: Neck supple.     Right lower leg: Edema present.     Left lower leg: Edema present.  Skin:    General:  Skin is warm.  Neurological:     Mental Status: He is alert and oriented to person, place, and time.     ED Results / Procedures / Treatments   Labs (all labs ordered are listed, but only abnormal results are displayed) Labs Reviewed  COMPREHENSIVE METABOLIC PANEL - Abnormal; Notable for the following components:      Result Value   Potassium 2.8 (*)    Calcium 7.6 (*)    Total Protein 6.3 (*)    Albumin 3.4 (*)    All other components within normal limits  CBC WITH DIFFERENTIAL/PLATELET - Abnormal; Notable for the following components:   RBC 3.28 (*)    Hemoglobin 10.0 (*)    HCT 32.8 (*)    All other components within normal limits  BRAIN NATRIURETIC PEPTIDE    EKG None  Radiology DG Chest Port 1 View  Result Date: 09/02/2023 CLINICAL DATA:  Shortness of breath EXAM: PORTABLE CHEST 1 VIEW COMPARISON:  08/23/2023 FINDINGS: Small right and trace left pleural effusions. No frank interstitial edema. Cardiomegaly. Left subclavian pacemaker. Postsurgical changes related to prior CABG. Median sternotomy. Right arm PICC terminates at the cavoatrial junction. IMPRESSION: Small right and trace left pleural effusions. Electronically Signed   By: Charline Bills M.D.   On: 09/02/2023 18:45    Procedures Procedures    Medications Ordered in ED Medications  potassium chloride SA (KLOR-CON M) CR tablet 60 mEq (has no administration in time range)    ED Course/ Medical Decision Making/ A&P                                 Medical Decision Making Amount and/or Complexity of Data Reviewed Labs: ordered. Radiology: ordered.  Risk Prescription drug management.  89 year old male comes in with chief complaint of increasing leg swelling, orthopnea and PND.  He has known history of CAD status post CABG and congestive heart failure, A-fib.  I have reviewed patient's recent discharge summary, it appears that the Lasix were discontinued because of low blood pressure.  Patient was  admitted at that time for cellulitis.  Differential diagnosis for him includes volume overload, acute on chronic CHF exacerbation, pulmonary edema, pleural effusion, PE and anasarca.  Basic labs ordered.  CBC reveals no acute anemia.  Patient does not have any new renal failure. BNP is slightly elevated.  Patient needed optimization on the fluid then. He was found to have hypokalemia on my evaluation.  Nursing staff came to me and stated that patient wants to leave AMA.  Patient wants to leave against medical advice. Patient understands that his actions will lead to inadequate medical workup, and that he is at risk of complications of missed diagnosis, which includes morbidity and  mortality.  Alternative options discussed -consider IV Lasix here to make sure he is responding or an overnight stay.  Discussed with him the low potassium as well that could be a concern. Opportunity to change mind given. Discussion witnessed by patient's son Patient is demonstrating good capacity to make decision. Patient understands that he needs to return to the ER immediately if his symptoms get worse.  We will discharge patient with potassium and Lasix.  Final Clinical Impression(s) / ED Diagnoses Final diagnoses:  Acute hypokalemia  Hypervolemia, unspecified hypervolemia type    Rx / DC Orders ED Discharge Orders          Ordered    potassium chloride SA (KLOR-CON M) 20 MEQ tablet  2 times daily        09/02/23 2004              Derwood Kaplan, MD 09/07/23 [phone]

## 2023-09-02 NOTE — Discharge Instructions (Signed)
Double the lasix for the next 5 days. Take potassium tablets as prescribed.  See your doctor as soon as possible. Return to the Er if there is any severe chest pain, shortness of breath.

## 2023-09-03 ENCOUNTER — Telehealth: Payer: Self-pay

## 2023-09-03 DIAGNOSIS — I38 Endocarditis, valve unspecified: Secondary | ICD-10-CM | POA: Diagnosis not present

## 2023-09-03 NOTE — Telephone Encounter (Addendum)
Dr. Elinor Parkinson patient.  Patient calling stating he is having a lot of lower leg swelling. He went to [facility] ED yesterday, but states he was unable to stay, so he left. Patient states he feels his legs are about to explode. I have also reached out to cardiology for the patient to see if he can be seen there.  Patient feels the swelling is coming from the IV abx.  I was able to get him a follow up at cardiology on 09/09/23 Please advise.

## 2023-09-03 NOTE — Telephone Encounter (Signed)
Could be, but he has no other good option. Agree cardiology visit can help manage volume overload

## 2023-09-05 DIAGNOSIS — I38 Endocarditis, valve unspecified: Secondary | ICD-10-CM | POA: Diagnosis not present

## 2023-09-07 DIAGNOSIS — A419 Sepsis, unspecified organism: Secondary | ICD-10-CM | POA: Diagnosis not present

## 2023-09-07 DIAGNOSIS — I2510 Atherosclerotic heart disease of native coronary artery without angina pectoris: Secondary | ICD-10-CM | POA: Diagnosis not present

## 2023-09-07 DIAGNOSIS — I088 Other rheumatic multiple valve diseases: Secondary | ICD-10-CM | POA: Diagnosis not present

## 2023-09-07 DIAGNOSIS — E871 Hypo-osmolality and hyponatremia: Secondary | ICD-10-CM | POA: Diagnosis not present

## 2023-09-07 DIAGNOSIS — G609 Hereditary and idiopathic neuropathy, unspecified: Secondary | ICD-10-CM | POA: Diagnosis not present

## 2023-09-07 DIAGNOSIS — H9190 Unspecified hearing loss, unspecified ear: Secondary | ICD-10-CM | POA: Diagnosis not present

## 2023-09-07 DIAGNOSIS — I480 Paroxysmal atrial fibrillation: Secondary | ICD-10-CM | POA: Diagnosis not present

## 2023-09-07 DIAGNOSIS — D649 Anemia, unspecified: Secondary | ICD-10-CM | POA: Diagnosis not present

## 2023-09-07 DIAGNOSIS — I119 Hypertensive heart disease without heart failure: Secondary | ICD-10-CM | POA: Diagnosis not present

## 2023-09-08 DIAGNOSIS — I38 Endocarditis, valve unspecified: Secondary | ICD-10-CM | POA: Diagnosis not present

## 2023-09-08 NOTE — Telephone Encounter (Signed)
Patient reports he does not feel he can continue the IV abx. He still is having increased swelling, fatigue and does not feel he can come to the appointment tomorrow.  Patient also reports SOB. Patient would like to end IV abx early.   Patient does not wish to go to the ED at this time.  Kevin Collins Jonathon Resides, CMA

## 2023-09-09 ENCOUNTER — Encounter (HOSPITAL_COMMUNITY): Payer: Self-pay

## 2023-09-09 ENCOUNTER — Emergency Department (HOSPITAL_COMMUNITY): Payer: Medicare PPO

## 2023-09-09 ENCOUNTER — Inpatient Hospital Stay: Payer: Medicare PPO | Admitting: Infectious Diseases

## 2023-09-09 ENCOUNTER — Ambulatory Visit: Payer: Medicare PPO | Admitting: Physician Assistant

## 2023-09-09 ENCOUNTER — Inpatient Hospital Stay (HOSPITAL_COMMUNITY)
Admission: EM | Admit: 2023-09-09 | Discharge: 2023-09-14 | DRG: 291 | Disposition: A | Discharge status: Home Health | Payer: Medicare PPO | Attending: Internal Medicine | Admitting: Internal Medicine

## 2023-09-09 DIAGNOSIS — R918 Other nonspecific abnormal finding of lung field: Secondary | ICD-10-CM | POA: Diagnosis not present

## 2023-09-09 DIAGNOSIS — I4819 Other persistent atrial fibrillation: Secondary | ICD-10-CM | POA: Diagnosis present

## 2023-09-09 DIAGNOSIS — Z66 Do not resuscitate: Secondary | ICD-10-CM | POA: Diagnosis present

## 2023-09-09 DIAGNOSIS — I509 Heart failure, unspecified: Secondary | ICD-10-CM | POA: Diagnosis not present

## 2023-09-09 DIAGNOSIS — R188 Other ascites: Secondary | ICD-10-CM | POA: Diagnosis not present

## 2023-09-09 DIAGNOSIS — I255 Ischemic cardiomyopathy: Secondary | ICD-10-CM | POA: Diagnosis present

## 2023-09-09 DIAGNOSIS — R7989 Other specified abnormal findings of blood chemistry: Secondary | ICD-10-CM

## 2023-09-09 DIAGNOSIS — G6289 Other specified polyneuropathies: Secondary | ICD-10-CM | POA: Diagnosis not present

## 2023-09-09 DIAGNOSIS — I10 Essential (primary) hypertension: Secondary | ICD-10-CM | POA: Diagnosis present

## 2023-09-09 DIAGNOSIS — R7401 Elevation of levels of liver transaminase levels: Secondary | ICD-10-CM | POA: Diagnosis not present

## 2023-09-09 DIAGNOSIS — N1832 Chronic kidney disease, stage 3b: Secondary | ICD-10-CM | POA: Diagnosis present

## 2023-09-09 DIAGNOSIS — I130 Hypertensive heart and chronic kidney disease with heart failure and stage 1 through stage 4 chronic kidney disease, or unspecified chronic kidney disease: Secondary | ICD-10-CM | POA: Diagnosis not present

## 2023-09-09 DIAGNOSIS — I5043 Acute on chronic combined systolic (congestive) and diastolic (congestive) heart failure: Principal | ICD-10-CM | POA: Diagnosis present

## 2023-09-09 DIAGNOSIS — R7881 Bacteremia: Secondary | ICD-10-CM | POA: Diagnosis not present

## 2023-09-09 DIAGNOSIS — E876 Hypokalemia: Secondary | ICD-10-CM | POA: Diagnosis not present

## 2023-09-09 DIAGNOSIS — Z79899 Other long term (current) drug therapy: Secondary | ICD-10-CM

## 2023-09-09 DIAGNOSIS — K8020 Calculus of gallbladder without cholecystitis without obstruction: Secondary | ICD-10-CM | POA: Diagnosis not present

## 2023-09-09 DIAGNOSIS — J918 Pleural effusion in other conditions classified elsewhere: Secondary | ICD-10-CM | POA: Diagnosis present

## 2023-09-09 DIAGNOSIS — R509 Fever, unspecified: Secondary | ICD-10-CM | POA: Diagnosis not present

## 2023-09-09 DIAGNOSIS — Z951 Presence of aortocoronary bypass graft: Secondary | ICD-10-CM

## 2023-09-09 DIAGNOSIS — I5023 Acute on chronic systolic (congestive) heart failure: Secondary | ICD-10-CM | POA: Diagnosis not present

## 2023-09-09 DIAGNOSIS — R110 Nausea: Secondary | ICD-10-CM | POA: Diagnosis present

## 2023-09-09 DIAGNOSIS — Z87891 Personal history of nicotine dependence: Secondary | ICD-10-CM

## 2023-09-09 DIAGNOSIS — R001 Bradycardia, unspecified: Secondary | ICD-10-CM | POA: Diagnosis present

## 2023-09-09 DIAGNOSIS — R0602 Shortness of breath: Secondary | ICD-10-CM | POA: Diagnosis not present

## 2023-09-09 DIAGNOSIS — N179 Acute kidney failure, unspecified: Secondary | ICD-10-CM | POA: Diagnosis not present

## 2023-09-09 DIAGNOSIS — R0989 Other specified symptoms and signs involving the circulatory and respiratory systems: Secondary | ICD-10-CM | POA: Diagnosis not present

## 2023-09-09 DIAGNOSIS — K761 Chronic passive congestion of liver: Secondary | ICD-10-CM | POA: Diagnosis present

## 2023-09-09 DIAGNOSIS — Z9889 Other specified postprocedural states: Secondary | ICD-10-CM | POA: Diagnosis not present

## 2023-09-09 DIAGNOSIS — R197 Diarrhea, unspecified: Secondary | ICD-10-CM | POA: Diagnosis present

## 2023-09-09 DIAGNOSIS — G629 Polyneuropathy, unspecified: Secondary | ICD-10-CM | POA: Diagnosis not present

## 2023-09-09 DIAGNOSIS — I480 Paroxysmal atrial fibrillation: Secondary | ICD-10-CM | POA: Diagnosis not present

## 2023-09-09 DIAGNOSIS — K760 Fatty (change of) liver, not elsewhere classified: Secondary | ICD-10-CM | POA: Diagnosis not present

## 2023-09-09 DIAGNOSIS — Z950 Presence of cardiac pacemaker: Secondary | ICD-10-CM

## 2023-09-09 DIAGNOSIS — I2510 Atherosclerotic heart disease of native coronary artery without angina pectoris: Secondary | ICD-10-CM | POA: Diagnosis present

## 2023-09-09 DIAGNOSIS — I5020 Unspecified systolic (congestive) heart failure: Secondary | ICD-10-CM | POA: Diagnosis not present

## 2023-09-09 DIAGNOSIS — E785 Hyperlipidemia, unspecified: Secondary | ICD-10-CM | POA: Diagnosis not present

## 2023-09-09 DIAGNOSIS — B952 Enterococcus as the cause of diseases classified elsewhere: Secondary | ICD-10-CM | POA: Diagnosis present

## 2023-09-09 DIAGNOSIS — I38 Endocarditis, valve unspecified: Secondary | ICD-10-CM | POA: Diagnosis not present

## 2023-09-09 DIAGNOSIS — R601 Generalized edema: Secondary | ICD-10-CM

## 2023-09-09 DIAGNOSIS — Z7901 Long term (current) use of anticoagulants: Secondary | ICD-10-CM

## 2023-09-09 DIAGNOSIS — Z823 Family history of stroke: Secondary | ICD-10-CM | POA: Diagnosis not present

## 2023-09-09 DIAGNOSIS — R531 Weakness: Secondary | ICD-10-CM | POA: Diagnosis not present

## 2023-09-09 DIAGNOSIS — I110 Hypertensive heart disease with heart failure: Secondary | ICD-10-CM | POA: Diagnosis not present

## 2023-09-09 DIAGNOSIS — R778 Other specified abnormalities of plasma proteins: Secondary | ICD-10-CM | POA: Diagnosis not present

## 2023-09-09 DIAGNOSIS — J90 Pleural effusion, not elsewhere classified: Secondary | ICD-10-CM | POA: Diagnosis not present

## 2023-09-09 LAB — BRAIN NATRIURETIC PEPTIDE: B Natriuretic Peptide: 932 pg/mL — ABNORMAL HIGH (ref 0.0–100.0)

## 2023-09-09 LAB — CBC
HCT: 34.5 % — ABNORMAL LOW (ref 39.0–52.0)
Hemoglobin: 10.7 g/dL — ABNORMAL LOW (ref 13.0–17.0)
MCH: 30.7 pg (ref 26.0–34.0)
MCHC: 31 g/dL (ref 30.0–36.0)
MCV: 98.9 fL (ref 80.0–100.0)
Platelets: 200 10*3/uL (ref 150–400)
RBC: 3.49 MIL/uL — ABNORMAL LOW (ref 4.22–5.81)
RDW: 15.1 % (ref 11.5–15.5)
WBC: 10.3 10*3/uL (ref 4.0–10.5)
nRBC: 0 % (ref 0.0–0.2)

## 2023-09-09 LAB — COMPREHENSIVE METABOLIC PANEL
ALT: 205 U/L — ABNORMAL HIGH (ref 0–44)
AST: 311 U/L — ABNORMAL HIGH (ref 15–41)
Albumin: 3.5 g/dL (ref 3.5–5.0)
Alkaline Phosphatase: 53 U/L (ref 38–126)
Anion gap: 15 (ref 5–15)
BUN: 14 mg/dL (ref 8–23)
CO2: 25 mmol/L (ref 22–32)
Calcium: 7.8 mg/dL — ABNORMAL LOW (ref 8.9–10.3)
Chloride: 97 mmol/L — ABNORMAL LOW (ref 98–111)
Creatinine, Ser: 1.47 mg/dL — ABNORMAL HIGH (ref 0.61–1.24)
GFR, Estimated: 45 mL/min — ABNORMAL LOW (ref >60)
Glucose, Bld: 107 mg/dL — ABNORMAL HIGH (ref 70–99)
Potassium: 4.4 mmol/L (ref 3.5–5.1)
Sodium: 137 mmol/L (ref 135–145)
Total Bilirubin: 1 mg/dL (ref 0.3–1.2)
Total Protein: 6.3 g/dL — ABNORMAL LOW (ref 6.5–8.1)

## 2023-09-09 LAB — TROPONIN I (HIGH SENSITIVITY): Troponin I (High Sensitivity): 30 ng/L — ABNORMAL HIGH (ref <18)

## 2023-09-09 MED ORDER — FUROSEMIDE 10 MG/ML IJ SOLN
40.0000 mg | Freq: Once | INTRAMUSCULAR | Status: AC
  Administered 2023-09-09: 40 mg via INTRAVENOUS
  Filled 2023-09-09: qty 4

## 2023-09-09 MED ORDER — SODIUM CHLORIDE 0.9 % IV SOLN
2.0000 g | Freq: Four times a day (QID) | INTRAVENOUS | Status: DC
  Administered 2023-09-10 – 2023-09-14 (×18): 2 g via INTRAVENOUS
  Filled 2023-09-09: qty 2
  Filled 2023-09-09 (×20): qty 2000
  Filled 2023-09-09: qty 2
  Filled 2023-09-09 (×3): qty 2000

## 2023-09-09 MED ORDER — GABAPENTIN 400 MG PO CAPS
400.0000 mg | ORAL_CAPSULE | Freq: Every day | ORAL | Status: DC
  Administered 2023-09-10 – 2023-09-14 (×5): 400 mg via ORAL
  Filled 2023-09-09 (×5): qty 1

## 2023-09-09 MED ORDER — CEFTRIAXONE IV (FOR PTA / DISCHARGE USE ONLY): 2.0000 g | Freq: Two times a day (BID) | INTRAVENOUS | Status: DC

## 2023-09-09 MED ORDER — FUROSEMIDE 10 MG/ML IJ SOLN
60.0000 mg | Freq: Two times a day (BID) | INTRAMUSCULAR | Status: DC
  Administered 2023-09-10 – 2023-09-13 (×8): 60 mg via INTRAVENOUS
  Filled 2023-09-09 (×9): qty 6

## 2023-09-09 MED ORDER — SODIUM CHLORIDE 0.9 % IV SOLN
2.0000 g | Freq: Two times a day (BID) | INTRAVENOUS | Status: DC
  Administered 2023-09-09 – 2023-09-14 (×10): 2 g via INTRAVENOUS
  Filled 2023-09-09 (×10): qty 20

## 2023-09-09 MED ORDER — OXYCODONE HCL 5 MG PO TABS
5.0000 mg | ORAL_TABLET | ORAL | Status: DC | PRN
  Administered 2023-09-12 – 2023-09-14 (×2): 5 mg via ORAL
  Filled 2023-09-09 (×2): qty 1

## 2023-09-09 MED ORDER — TRAMADOL HCL 50 MG PO TABS: 50.0000 mg | ORAL_TABLET | Freq: Three times a day (TID) | ORAL | Status: DC | PRN

## 2023-09-09 MED ORDER — ONDANSETRON HCL 4 MG PO TABS: 4.0000 mg | ORAL_TABLET | Freq: Four times a day (QID) | ORAL | Status: DC | PRN

## 2023-09-09 MED ORDER — CINACALCET HCL 30 MG PO TABS
90.0000 mg | ORAL_TABLET | Freq: Every day | ORAL | Status: DC
  Administered 2023-09-10 – 2023-09-14 (×5): 90 mg via ORAL
  Filled 2023-09-09 (×5): qty 3

## 2023-09-09 MED ORDER — APIXABAN 2.5 MG PO TABS
2.5000 mg | ORAL_TABLET | Freq: Two times a day (BID) | ORAL | Status: DC
  Administered 2023-09-09 – 2023-09-14 (×10): 2.5 mg via ORAL
  Filled 2023-09-09 (×10): qty 1

## 2023-09-09 MED ORDER — EZETIMIBE 10 MG PO TABS
10.0000 mg | ORAL_TABLET | Freq: Every day | ORAL | Status: DC
  Administered 2023-09-10 – 2023-09-14 (×5): 10 mg via ORAL
  Filled 2023-09-09 (×5): qty 1

## 2023-09-09 MED ORDER — AMPICILLIN IV (FOR PTA / DISCHARGE USE ONLY): 8.0000 g | INTRAVENOUS | Status: DC

## 2023-09-09 MED ORDER — ONDANSETRON HCL 4 MG/2ML IJ SOLN: 4.0000 mg | Freq: Four times a day (QID) | INTRAMUSCULAR | Status: DC | PRN

## 2023-09-09 NOTE — ED Provider Notes (Signed)
[place] EMERGENCY DEPARTMENT AT [facility] Provider Note   CSN: [id number] Arrival date & time: 09/09/23  1644     History  Chief Complaint  Patient presents with   Shortness of Breath   Dizziness   Weakness    Kevin Collins is a 89 y.o. male.  Pt c/o general weakness, and feels dehydrated. Indicates recent admission and discharge 9/7 with left foot infection/sepsis - has been on iv abx via picc line at home.  Pt indicates poor po intake in past day, with nausea. No abd pain or vomiting. Has loose stools, 1-2 times per day. No abd distension. No fever, chills or sweats. No new pain, swelling or redness to foot. No redness or pain to picc site. Denies headache. No chest pain or discomfort. No sob, although if lies flat at night will note some sob. Also has noted persistent bil foot/ankle and scrotal swelling. No new change in meds, except did not take abx dose today, and has recently restarted lasix that was previously held.   The history is provided by the patient, a relative and medical records.  Shortness of Breath Associated symptoms: no abdominal pain, no chest pain, no cough, no fever, no headaches, no neck pain, no rash, no sore throat and no vomiting   Dizziness Associated symptoms: diarrhea, shortness of breath and weakness   Associated symptoms: no chest pain, no headaches and no vomiting   Weakness Associated symptoms: diarrhea, dizziness and shortness of breath   Associated symptoms: no abdominal pain, no chest pain, no cough, no dysuria, no fever, no headaches and no vomiting        Home Medications Prior to Admission medications   Medication Sig Start Date End Date Taking? Authorizing Provider  ampicillin IVPB Inject 8 g into the vein daily. As a continuous infusion Indication:  Endocarditis First Dose: Yes Last Day of Therapy:  09/30/2023 Labs - Once weekly:  CBC/D and BMP, Labs - Once weekly: ESR and CRP Method of administration: Ambulatory Pump  (Continuous Infusion) Method of administration may be changed at the discretion of home infusion pharmacist based upon assessment of the patient and/or caregiver's ability to self-administer the medication ordered. 08/22/23 10/01/23  Burnadette Pop, MD  bimatoprost (LUMIGAN) 0.01 % SOLN Place 1 drop into both eyes at bedtime.    [provider]  cefTRIAXone (ROCEPHIN) IVPB Inject 2 g into the vein every 12 (twelve) hours. Indication:  Endocarditis First Dose: Yes Last Day of Therapy:  09/30/2023 Labs - Once weekly:  CBC/D and BMP, Labs - Once weekly: ESR and CRP Method of administration: IV Push Method of administration may be changed at the discretion of home infusion pharmacist based upon assessment of the patient and/or caregiver's ability to self-administer the medication ordered. 08/22/23 10/01/23  Burnadette Pop, MD  cinacalcet (SENSIPAR) 90 MG tablet Take 90 mg by mouth daily. 10/31/20   [provider]  ELIQUIS 2.5 MG TABS tablet TAKE 1 TABLET TWICE A DAY 12/02/22   Rollene Rotunda, MD  ezetimibe (ZETIA) 10 MG tablet TAKE 1 TABLET DAILY (NEED APPOINTMENT FOR FUTURE REFILLS) 08/10/23   Rollene Rotunda, MD  gabapentin (NEURONTIN) 800 MG tablet Take 1 tablet (800 mg total) by mouth daily. 08/22/23   Burnadette Pop, MD  KRILL OIL PO Take 1 capsule by mouth daily.    [provider]  loperamide (IMODIUM) 2 MG capsule Take 1 capsule (2 mg total) by mouth every 6 (six) hours as needed for diarrhea or loose stools.  08/22/23   Burnadette Pop, MD  Multiple Vitamins-Minerals (PRESERVISION AREDS) CAPS Take 1 capsule by mouth 2 (two) times daily.    [provider]  oxyCODONE-acetaminophen (PERCOCET) 7.5-325 MG tablet Take 1 tablet by mouth 3 (three) times daily.    [provider]  potassium chloride SA (KLOR-CON M) 20 MEQ tablet Take 1 tablet (20 mEq total) by mouth 2 (two) times daily. 09/02/23   Derwood Kaplan, MD  simvastatin (ZOCOR) 80 MG tablet TAKE 1  TABLET DAILY 08/11/23   Rollene Rotunda, MD      Allergies    Patient has no known allergies.    Review of Systems   Review of Systems  Constitutional:  Negative for chills and fever.  HENT:  Negative for sore throat.   Eyes:  Negative for redness.  Respiratory:  Positive for shortness of breath. Negative for cough.   Cardiovascular:  Negative for chest pain.  Gastrointestinal:  Positive for diarrhea. Negative for abdominal pain and vomiting.  Genitourinary:  Negative for decreased urine volume, difficulty urinating, dysuria and flank pain.  Musculoskeletal:  Negative for back pain and neck pain.  Skin:  Negative for rash.  Neurological:  Positive for dizziness and weakness. Negative for headaches.  Psychiatric/Behavioral:  Negative for confusion.     Physical Exam Updated Vital Signs BP 125/73   Pulse 73   Temp 98.2 F (36.8 C) (Oral)   Resp 18   Ht 1.778 m (5\' 10" )   Wt 76.2 kg   SpO2 95%   BMI 24.11 kg/m  Physical Exam Vitals and nursing note reviewed.  Constitutional:      Appearance: Normal appearance. He is well-developed.  HENT:     Head: Atraumatic.     Nose: Nose normal.     Mouth/Throat:     Mouth: Mucous membranes are moist.     Pharynx: Oropharynx is clear.  Eyes:     General: No scleral icterus.    Conjunctiva/sclera: Conjunctivae normal.     Pupils: Pupils are equal, round, and reactive to light.  Neck:     Trachea: No tracheal deviation.  Cardiovascular:     Rate and Rhythm: Normal rate. Rhythm irregular.     Pulses: Normal pulses.     Heart sounds: Normal heart sounds. No murmur heard.    No friction rub. No gallop.  Pulmonary:     Effort: Pulmonary effort is normal. No accessory muscle usage or respiratory distress.     Breath sounds: Normal breath sounds.  Abdominal:     General: Bowel sounds are normal. There is no distension.     Palpations: Abdomen is soft. There is no mass.     Tenderness: There is no abdominal tenderness.   Genitourinary:    Comments: No cva tenderness. No scrotal edema, no infection noted.  Musculoskeletal:        General: No swelling.     Cervical back: Normal range of motion and neck supple. No rigidity.     Comments: RUE picc site without sign of infection. Mild bil foot/ankle swelling, symmetric. No acute foot infection noted.   Skin:    General: Skin is warm and dry.     Findings: No rash.  Neurological:     Mental Status: He is alert.     Comments: Alert, speech clear. Motor/sens grossly intact bil.   Psychiatric:        Mood and Affect: Mood normal.     ED Results / Procedures / Treatments  Labs (all labs ordered are listed, but only abnormal results are displayed) Results for orders placed or performed during the hospital encounter of 09/09/23  Comprehensive metabolic panel  Result Value Ref Range   Sodium 137 135 - 145 mmol/L   Potassium 4.4 3.5 - 5.1 mmol/L   Chloride 97 (L) 98 - 111 mmol/L   CO2 25 22 - 32 mmol/L   Glucose, Bld 107 (H) 70 - 99 mg/dL   BUN 14 8 - 23 mg/dL   Creatinine, Ser 3.66 (H) 0.61 - 1.24 mg/dL   Calcium 7.8 (L) 8.9 - 10.3 mg/dL   Total Protein 6.3 (L) 6.5 - 8.1 g/dL   Albumin 3.5 3.5 - 5.0 g/dL   AST 440 (H) 15 - 41 U/L   ALT 205 (H) 0 - 44 U/L   Alkaline Phosphatase 53 38 - 126 U/L   Total Bilirubin 1.0 0.3 - 1.2 mg/dL   GFR, Estimated 45 (L) >60 mL/min   Anion gap 15 5 - 15  CBC  Result Value Ref Range   WBC 10.3 4.0 - 10.5 K/uL   RBC 3.49 (L) 4.22 - 5.81 MIL/uL   Hemoglobin 10.7 (L) 13.0 - 17.0 g/dL   HCT 34.7 (L) 42.5 - 95.6 %   MCV 98.9 80.0 - 100.0 fL   MCH 30.7 26.0 - 34.0 pg   MCHC 31.0 30.0 - 36.0 g/dL   RDW 38.7 56.4 - 33.2 %   Platelets 200 150 - 400 K/uL   nRBC 0.0 0.0 - 0.2 %  Brain natriuretic peptide  Result Value Ref Range   B Natriuretic Peptide 932.0 (H) 0.0 - 100.0 pg/mL  Troponin I (High Sensitivity)  Result Value Ref Range   Troponin I (High Sensitivity) 30 (H) <18 ng/L   DG Chest Port 1 View  Result  Date: 09/09/2023 CLINICAL DATA:  Weakness EXAM: PORTABLE CHEST 1 VIEW COMPARISON:  Chest x-ray 09/02/2023 FINDINGS: Right upper extremity PICC terminates in the SVC. Left-sided ICD and sternotomy wires are again seen. The heart is enlarged. There central pulmonary vascular congestion. There central interstitial opacities as well likely related to edema. There are small bilateral pleural effusions, right greater than left. There are patchy opacities in the left lung base. There is no pneumothorax. IMPRESSION: 1. Cardiomegaly with central pulmonary vascular congestion and central interstitial opacities likely related to edema. 2. Small bilateral pleural effusions, right greater than left. 3. Patchy opacities in the left lung base, likely atelectasis. Electronically Signed   By: Darliss Cheney M.D.   On: 09/09/2023 19:51   DG Chest Port 1 View  Result Date: 09/02/2023 CLINICAL DATA:  Shortness of breath EXAM: PORTABLE CHEST 1 VIEW COMPARISON:  08/23/2023 FINDINGS: Small right and trace left pleural effusions. No frank interstitial edema. Cardiomegaly. Left subclavian pacemaker. Postsurgical changes related to prior CABG. Median sternotomy. Right arm PICC terminates at the cavoatrial junction. IMPRESSION: Small right and trace left pleural effusions. Electronically Signed   By: Charline Bills M.D.   On: 09/02/2023 18:45   DG CHEST PORT 1 VIEW  Result Date: 08/23/2023 CLINICAL DATA:  Status post PICC placement EXAM: PORTABLE CHEST 1 VIEW COMPARISON:  CXR 08/22/23 FINDINGS: Left-sided dual lead cardiac device unchanged lead positioning. Status post median sternotomy CABG. Right arm PICC with tip terminating the cavoatrial junction. Unchanged cardiac and mediastinal contours. No pleural effusion. No pneumothorax. Bibasilar atelectasis. No radiographically apparent displaced rib fractures. Visualized upper abdomen is unremarkable. IMPRESSION: Right arm PICC with tip terminating at the cavoatrial junction.  Electronically Signed   By: Lorenza Cambridge M.D.   On: 08/23/2023 09:18   [place] EKG SITE RITE  Result Date: 08/22/2023 If Site Rite image not attached, placement could not be confirmed due to current cardiac rhythm.  DG CHEST PORT 1 VIEW  Result Date: 08/22/2023 CLINICAL DATA:  S/P PICC central line placement EXAM: PORTABLE CHEST 1 VIEW COMPARISON:  CXR 08/18/23 FINDINGS: Left-sided dual lead cardiac device with unchanged lead positioning. Status post median sternotomy and CABG. Likely small left-sided pleural effusion. Cardiomegaly. Hazy bibasilar airspace opacities, left-greater-than-right, could represent atelectasis or infection. No radiographically apparent displaced rib fractures. Visualized upper abdomen is unremarkable. Right arm PICC with the tip projecting over the lower SVC. IMPRESSION: 1.  Right arm PICC with the tip projecting over the lower SVC. 2. Bibasilar airspace opacities, left-greater-than-right, could represent atelectasis or infection. Electronically Signed   By: Lorenza Cambridge M.D.   On: 08/22/2023 09:46   [place] EKG SITE RITE  Result Date: 08/22/2023 If Site Rite image not attached, placement could not be confirmed due to current cardiac rhythm.  ECHOCARDIOGRAM COMPLETE  Result Date: 08/20/2023    ECHOCARDIOGRAM REPORT   Patient Name:   KEMO FINKEL Date of Exam: 08/20/2023 Medical Rec #:  [id number]   Height:       70.0 in Accession #:    [id number]  Weight:       163.8 lb Date of Birth:  1929-12-31   BSA:          1.918 m Patient Age:    89 years    BP:           113/54 mmHg Patient Gender: M           HR:           63 bpm. Exam Location:  Inpatient Procedure: 2D Echo, Cardiac Doppler, Color Doppler and Intracardiac            Opacification Agent Indications:    Bacteremia R78.81  History:        Patient has prior history of Echocardiogram examinations, most                 recent 01/14/2022. CAD; Risk Factors:Dyslipidemia and                 Hypertension.  Sonographer:    Harriette Bouillon RDCS  Referring Phys: [id number] [facility] IMPRESSIONS  1. Left ventricular ejection fraction, by estimation, is 50 to 55%. The left ventricle has low normal function. The left ventricle has no regional wall motion abnormalities. The left ventricular internal cavity size was mildly dilated. Left ventricular diastolic parameters are indeterminate.  2. Right ventricular systolic function is normal. The right ventricular size is mildly enlarged. There is moderately elevated pulmonary artery systolic pressure.  3. Left atrial size was severely dilated.  4. Right atrial size was severely dilated.  5. The mitral valve is normal in structure. Mild mitral valve regurgitation. No evidence of mitral stenosis.  6. The aortic valve is tricuspid. Aortic valve regurgitation is mild. No aortic stenosis is present.  7. The inferior vena cava is dilated in size with <50% respiratory variability, suggesting right atrial pressure of 15 mmHg. FINDINGS  Left Ventricle: Left ventricular ejection fraction, by estimation, is 50 to 55%. The left ventricle has low normal function. The left ventricle has no regional wall motion abnormalities. Definity contrast agent was given IV to delineate the left ventricular endocardial borders. The left ventricular internal cavity size was  mildly dilated. There is no left ventricular hypertrophy. Left ventricular diastolic parameters are indeterminate. Right Ventricle: The right ventricular size is mildly enlarged. Right ventricular systolic function is normal. There is moderately elevated pulmonary artery systolic pressure. The tricuspid regurgitant velocity is 2.88 m/s, and with an assumed right atrial pressure of 15 mmHg, the estimated right ventricular systolic pressure is 48.2 mmHg. Left Atrium: Left atrial size was severely dilated. Right Atrium: Right atrial size was severely dilated. Pericardium: There is no evidence of pericardial effusion. Mitral Valve: The mitral valve is normal in structure.  Mild mitral valve regurgitation. No evidence of mitral valve stenosis. Tricuspid Valve: The tricuspid valve is normal in structure. Tricuspid valve regurgitation is mild . No evidence of tricuspid stenosis. Aortic Valve: The aortic valve is tricuspid. Aortic valve regurgitation is mild. No aortic stenosis is present. Pulmonic Valve: The pulmonic valve was normal in structure. Pulmonic valve regurgitation is trivial. No evidence of pulmonic stenosis. Aorta: The aortic root is normal in size and structure. Venous: The inferior vena cava is dilated in size with less than 50% respiratory variability, suggesting right atrial pressure of 15 mmHg. IAS/Shunts: No atrial level shunt detected by color flow Doppler. Additional Comments: A device lead is visualized.  LEFT VENTRICLE PLAX 2D LVIDd:         6.00 cm   Diastology LVIDs:         4.30 cm   LV e' medial:    9.25 cm/s LV PW:         0.70 cm   LV E/e' medial:  11.6 LV IVS:        0.70 cm   LV e' lateral:   6.53 cm/s LVOT diam:     2.00 cm   LV E/e' lateral: 16.4 LV SV:         64 LV SV Index:   34 LVOT Area:     3.14 cm  RIGHT VENTRICLE            IVC RV S prime:     7.72 cm/s  IVC diam: 2.60 cm TAPSE (M-mode): 1.8 cm LEFT ATRIUM            Index        RIGHT ATRIUM           Index LA diam:      4.90 cm  2.56 cm/m   RA Area:     26.00 cm LA Vol (A4C): 103.0 ml 53.72 ml/m  RA Volume:   82.60 ml  43.08 ml/m  AORTIC VALVE             PULMONIC VALVE LVOT Vmax:   112.00 cm/s PV Vmax:       0.83 m/s LVOT Vmean:  78.300 cm/s PV Peak grad:  2.7 mmHg LVOT VTI:    0.205 m  AORTA Ao Root diam: 3.70 cm Ao Asc diam:  3.60 cm MITRAL VALVE                TRICUSPID VALVE MV Area (PHT): 4.40 cm     TR Peak grad:   33.2 mmHg MV E velocity: 107.00 cm/s  TR Vmax:        288.00 cm/s MV A velocity: 39.80 cm/s MV E/A ratio:  2.69         SHUNTS                             Systemic  VTI:  0.20 m                             Systemic Diam: 2.00 cm Olga Millers MD Electronically signed by  Olga Millers MD Signature Date/Time: 08/20/2023/2:49:12 PM    Final    DG Foot Complete Left  Result Date: 08/20/2023 CLINICAL DATA:  Osteomyelitis EXAM: LEFT FOOT - COMPLETE 3+ VIEW COMPARISON:  None Available. FINDINGS: There is fragmentation of the distal phalanx of the fourth toe. There is no other evidence of acute osseous abnormality in the foot. There is no other osseous erosion or destruction. Bony alignment is normal. There is mild degenerative change about the midfoot and mild inferior calcaneal spurring. There is no soft tissue gas or radiopaque foreign body. IMPRESSION: Fragmentation of the fourth distal phalanx may reflect fracture, osteomyelitis, or combination. Correlate with physical exam and consider MRI as indicated. Electronically Signed   By: Lesia Hausen M.D.   On: 08/20/2023 13:31   DG Chest Port 1 View  Result Date: 08/18/2023 CLINICAL DATA:  Questionable sepsis - evaluate for abnormality. 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 Prior CABG. Heart and mediastinal contours within normal limits. Aortic atherosclerosis. No confluent opacities or effusions. No acute bony abnormality. IMPRESSION: No active disease. Electronically Signed   By: Charlett Nose M.D.   On: 08/18/2023 21:23     EKG EKG Interpretation Date/Time:  Wednesday September 09 2023 18:52:37 EDT Ventricular Rate:  68 PR Interval:    QRS Duration:  148 QT Interval:  511 QTC Calculation: 524 R Axis:   207  Text Interpretation: Electronic ventricular pacemaker Confirmed by Cathren Laine ([id number]) on 09/09/2023 7:15:09 PM  Radiology DG Chest Port 1 View  Result Date: 09/09/2023 CLINICAL DATA:  Weakness EXAM: PORTABLE CHEST 1 VIEW COMPARISON:  Chest x-ray 09/02/2023 FINDINGS: Right upper extremity PICC terminates in the SVC. Left-sided ICD and sternotomy wires are again seen. The heart is enlarged. There central pulmonary vascular  congestion. There central interstitial opacities as well likely related to edema. There are small bilateral pleural effusions, right greater than left. There are patchy opacities in the left lung base. There is no pneumothorax. IMPRESSION: 1. Cardiomegaly with central pulmonary vascular congestion and central interstitial opacities likely related to edema. 2. Small bilateral pleural effusions, right greater than left. 3. Patchy opacities in the left lung base, likely atelectasis. Electronically Signed   By: Darliss Cheney M.D.   On: 09/09/2023 19:51    Procedures Procedures    Medications Ordered in ED Medications  furosemide (LASIX) injection 40 mg (has no administration in time range)    ED Course/ Medical Decision Making/ A&P                                 Medical Decision Making Problems Addressed: Acute on chronic combined systolic and diastolic CHF (congestive heart failure) (HCC): acute illness or injury that poses a threat to life or bodily functions Elevated brain natriuretic peptide (BNP) level: acute illness or injury Elevated LFTs: acute illness or injury Elevated troponin: acute illness or injury Generalized edema: acute illness or injury with systemic symptoms that poses a threat to life or bodily functions  Amount and/or Complexity of Data Reviewed Independent Historian:     Details: Family, hx External Data Reviewed: labs, radiology and notes. Labs:  ordered. Decision-making details documented in ED Course. Radiology: ordered and independent interpretation performed. Decision-making details documented in ED Course. ECG/medicine tests: ordered and independent interpretation performed. Decision-making details documented in ED Course.  Risk Prescription drug management. Decision regarding hospitalization.  Iv ns. Continuous pulse ox and cardiac monitoring. Labs ordered/sent. Imaging ordered.   Differential diagnosis includes dehydration, uti, aki, etc. Dispo decision  including potential need for admission considered - will get labs and imaging and reassess.   Reviewed nursing notes and prior charts for additional history. External reports reviewed. Additional history from: family.   Cardiac monitor: sinus rhythm, rate 90.  Labs reviewed/interpreted by me - bnp high. Trop mildly high. Denies chest pain. Lfts elevated, newly - denies abd pain, no abd tenderness. Is on chol med therapy, trend lfts.   Xrays reviewed/interpreted by me - vascular congestion, chf.   Lasix iv.   Hospitalists consulted for admission.  CRITICAL CARE RE: acute on chronic chf w dyspnea/edema, parenteral diuretic therapy Performed by: Suzi Roots Total critical care time: 40 minutes Critical care time was exclusive of separately billable procedures and treating other patients. Critical care was necessary to treat or prevent imminent or life-threatening deterioration. Critical care was time spent personally by me on the following activities: development of treatment plan with patient and/or surrogate as well as nursing, discussions with consultants, evaluation of patient's response to treatment, examination of patient, obtaining history from patient or surrogate, ordering and performing treatments and interventions, ordering and review of laboratory studies, ordering and review of radiographic studies, pulse oximetry and re-evaluation of patient's condition.           Final Clinical Impression(s) / ED Diagnoses Final diagnoses:  Acute on chronic combined systolic and diastolic CHF (congestive heart failure) (HCC)  Elevated brain natriuretic peptide (BNP) level  Generalized edema  Elevated LFTs  Elevated troponin    Rx / DC Orders ED Discharge Orders     None         Cathren Laine, MD 09/09/23 2051

## 2023-09-09 NOTE — ED Notes (Signed)
Pt ambulated to restroom. 

## 2023-09-09 NOTE — ED Triage Notes (Signed)
Per EMS, Pt, from home, c/o SOB, weakness, dizziness, SOB, urinary retention, and generalized edema x2 weeks.  Denies pain.   Pt reports infection in L foot and receives IV Ampicillin and Rocephin through PICC line at home.    Pt has been seen recently for same and left AMA.  Pt's family is concerned about dehydration.  Pt reports he has not been drinking d/t increasing edema.

## 2023-09-09 NOTE — ED Notes (Signed)
Pt aware we need a UA.

## 2023-09-10 ENCOUNTER — Other Ambulatory Visit (HOSPITAL_COMMUNITY): Payer: Self-pay | Admitting: *Deleted

## 2023-09-10 ENCOUNTER — Inpatient Hospital Stay (HOSPITAL_COMMUNITY): Payer: Medicare PPO

## 2023-09-10 DIAGNOSIS — I10 Essential (primary) hypertension: Secondary | ICD-10-CM

## 2023-09-10 DIAGNOSIS — R7881 Bacteremia: Secondary | ICD-10-CM | POA: Insufficient documentation

## 2023-09-10 DIAGNOSIS — E785 Hyperlipidemia, unspecified: Secondary | ICD-10-CM

## 2023-09-10 DIAGNOSIS — N179 Acute kidney failure, unspecified: Secondary | ICD-10-CM | POA: Diagnosis not present

## 2023-09-10 DIAGNOSIS — I5043 Acute on chronic combined systolic (congestive) and diastolic (congestive) heart failure: Secondary | ICD-10-CM | POA: Diagnosis not present

## 2023-09-10 DIAGNOSIS — G6289 Other specified polyneuropathies: Secondary | ICD-10-CM

## 2023-09-10 DIAGNOSIS — I509 Heart failure, unspecified: Secondary | ICD-10-CM | POA: Diagnosis not present

## 2023-09-10 DIAGNOSIS — R7401 Elevation of levels of liver transaminase levels: Secondary | ICD-10-CM | POA: Insufficient documentation

## 2023-09-10 DIAGNOSIS — I480 Paroxysmal atrial fibrillation: Secondary | ICD-10-CM | POA: Diagnosis not present

## 2023-09-10 LAB — URINALYSIS, ROUTINE W REFLEX MICROSCOPIC
Bacteria, UA: NONE SEEN
Bilirubin Urine: NEGATIVE
Glucose, UA: NEGATIVE mg/dL
Ketones, ur: NEGATIVE mg/dL
Leukocytes,Ua: NEGATIVE
Nitrite: NEGATIVE
Protein, ur: 100 mg/dL — AB
Specific Gravity, Urine: 1.011 (ref 1.005–1.030)
pH: 6 (ref 5.0–8.0)

## 2023-09-10 LAB — CBC WITH DIFFERENTIAL/PLATELET
Abs Immature Granulocytes: 0.08 10*3/uL — ABNORMAL HIGH (ref 0.00–0.07)
Basophils Absolute: 0.1 10*3/uL (ref 0.0–0.1)
Basophils Relative: 1 %
Eosinophils Absolute: 0 10*3/uL (ref 0.0–0.5)
Eosinophils Relative: 0 %
HCT: 34.7 % — ABNORMAL LOW (ref 39.0–52.0)
Hemoglobin: 10.4 g/dL — ABNORMAL LOW (ref 13.0–17.0)
Immature Granulocytes: 1 %
Lymphocytes Relative: 15 %
Lymphs Abs: 1.6 10*3/uL (ref 0.7–4.0)
MCH: 29.6 pg (ref 26.0–34.0)
MCHC: 30 g/dL (ref 30.0–36.0)
MCV: 98.9 fL (ref 80.0–100.0)
Monocytes Absolute: 1.4 10*3/uL — ABNORMAL HIGH (ref 0.1–1.0)
Monocytes Relative: 13 %
Neutro Abs: 7.1 10*3/uL (ref 1.7–7.7)
Neutrophils Relative %: 70 %
Platelets: 173 10*3/uL (ref 150–400)
RBC: 3.51 MIL/uL — ABNORMAL LOW (ref 4.22–5.81)
RDW: 15 % (ref 11.5–15.5)
WBC: 10.1 10*3/uL (ref 4.0–10.5)
nRBC: 0 % (ref 0.0–0.2)

## 2023-09-10 LAB — HEPATITIS PANEL, ACUTE
HCV Ab: NONREACTIVE
Hep A IgM: NONREACTIVE
Hep B C IgM: NONREACTIVE
Hepatitis B Surface Ag: NONREACTIVE

## 2023-09-10 LAB — ECHOCARDIOGRAM COMPLETE
AR max vel: 3.4 cm2
AV Area VTI: 2.9 cm2
AV Area mean vel: 3.08 cm2
AV Mean grad: 2.1 mmHg
AV Peak grad: 4.6 mmHg
Ao pk vel: 1.07 m/s
Area-P 1/2: 6.71 cm2
Calc EF: 45.4 %
Height: 70 in
MV M vel: 3.93 m/s
MV Peak grad: 61.8 mmHg
P 1/2 time: 726 msec
S' Lateral: 4.5 cm
Single Plane A2C EF: 34.2 %
Single Plane A4C EF: 53.7 %
Weight: 2687.85 oz

## 2023-09-10 LAB — COMPREHENSIVE METABOLIC PANEL
ALT: 761 U/L — ABNORMAL HIGH (ref 0–44)
AST: 559 U/L — ABNORMAL HIGH (ref 15–41)
Albumin: 3.3 g/dL — ABNORMAL LOW (ref 3.5–5.0)
Alkaline Phosphatase: 51 U/L (ref 38–126)
Anion gap: 16 — ABNORMAL HIGH (ref 5–15)
BUN: 16 mg/dL (ref 8–23)
CO2: 25 mmol/L (ref 22–32)
Calcium: 7.8 mg/dL — ABNORMAL LOW (ref 8.9–10.3)
Chloride: 97 mmol/L — ABNORMAL LOW (ref 98–111)
Creatinine, Ser: 1.37 mg/dL — ABNORMAL HIGH (ref 0.61–1.24)
GFR, Estimated: 49 mL/min — ABNORMAL LOW (ref >60)
Glucose, Bld: 93 mg/dL (ref 70–99)
Potassium: 3.8 mmol/L (ref 3.5–5.1)
Sodium: 138 mmol/L (ref 135–145)
Total Bilirubin: 0.7 mg/dL (ref 0.3–1.2)
Total Protein: 6.1 g/dL — ABNORMAL LOW (ref 6.5–8.1)

## 2023-09-10 LAB — TSH: TSH: 4.006 u[IU]/mL (ref 0.350–4.500)

## 2023-09-10 LAB — TROPONIN I (HIGH SENSITIVITY): Troponin I (High Sensitivity): 53 ng/L — ABNORMAL HIGH (ref <18)

## 2023-09-10 LAB — MAGNESIUM: Magnesium: 1.3 mg/dL — ABNORMAL LOW (ref 1.7–2.4)

## 2023-09-10 LAB — SEDIMENTATION RATE: Sed Rate: 2 mm/hr (ref 0–16)

## 2023-09-10 LAB — C-REACTIVE PROTEIN: CRP: 0.6 mg/dL (ref <1.0)

## 2023-09-10 MED ORDER — SODIUM CHLORIDE 0.9 % IV SOLN: INTRAVENOUS | Status: DC | PRN

## 2023-09-10 MED ORDER — LOPERAMIDE HCL 2 MG PO CAPS
2.0000 mg | ORAL_CAPSULE | Freq: Once | ORAL | Status: AC
  Administered 2023-09-10: 2 mg via ORAL
  Filled 2023-09-10: qty 1

## 2023-09-10 MED ORDER — CHLORHEXIDINE GLUCONATE CLOTH 2 % EX PADS
6.0000 | MEDICATED_PAD | Freq: Every day | CUTANEOUS | Status: DC
  Administered 2023-09-10 – 2023-09-14 (×5): 6 via TOPICAL

## 2023-09-10 MED ORDER — MAGNESIUM SULFATE 2 GM/50ML IV SOLN
2.0000 g | Freq: Once | INTRAVENOUS | Status: AC
  Administered 2023-09-10: 2 g via INTRAVENOUS
  Filled 2023-09-10: qty 50

## 2023-09-10 MED ORDER — ALPRAZOLAM 0.25 MG PO TABS
0.2500 mg | ORAL_TABLET | Freq: Three times a day (TID) | ORAL | Status: DC | PRN
  Administered 2023-09-10 – 2023-09-13 (×3): 0.25 mg via ORAL
  Filled 2023-09-10 (×3): qty 1

## 2023-09-10 NOTE — Assessment & Plan Note (Signed)
-   At last hospitalization was diagnosed with Enterococcus faecalis bacteremia - Was discharged on 6 weeks of ampicillin and Rocephin - Continue ampicillin and Rocephin - Given that he did have bacteremia recently and now that he is in acute heart failure, will repeat echo even though 1 was just done earlier this month to evaluate valves, signs for endocarditis - Continue to monitor

## 2023-09-10 NOTE — Care Management Important Message (Signed)
Important Message  Patient Details  Name: Kevin Collins MRN: [id number] Date of Birth: August 03, 1930   Important Message Given:  N/A - LOS <3 / Initial given by admissions     Corey Harold 09/10/2023, 11:35 AM

## 2023-09-10 NOTE — Plan of Care (Signed)
Problem: Fluid Volume: Goal: Hemodynamic stability will improve Outcome: Not Progressing   Problem: Clinical Measurements: Goal: Diagnostic test results will improve Outcome: Not Progressing Goal: Signs and symptoms of infection will decrease Outcome: Not Progressing   Problem: Respiratory: Goal: Ability to maintain adequate ventilation will improve Outcome: Not Progressing

## 2023-09-10 NOTE — Assessment & Plan Note (Signed)
-   Blood pressure currently controlled at 125/73-138/84 - Continue to monitor

## 2023-09-10 NOTE — H&P (Signed)
History and Physical    Patient: Kevin Collins NGE:[id number] DOB: Nov 25, 1930 DOA: 09/09/2023 DOS: the patient was seen and examined on 09/10/2023 PCP: Toma Deiters, MD  Patient coming from: Home  Chief Complaint:  Chief Complaint  Patient presents with   Shortness of Breath   Dizziness   Weakness   HPI: Kevin Collins is a 89 y.o. male with medical history significant of coronary artery disease, hyperlipidemia, hypertension, peripheral neuropathy, recent admission for bacteremia, pacemaker in place, and more presents the ED with a chief complaint of orthopnea.  Patient reports that he wakes up in the middle of the night feeling dizzy and like he might pass out and very short of breath.  He reports that this feeling can last throughout the day until 430 or 5 PM.  He reports it has been going on since his Lasix was discontinued at his last hospitalization.  Leaning back at all makes it worse.  Sitting straight up makes it better.  Patient denies chest pain.  He admits to nausea but no vomiting.  He admits to 1 night of palpitations, but cannot remember which night it was.  Patient reports that his main concern is his feeling that he might pass out when he wakes up.  He does not get this feeling when he goes from sitting to standing.  He feels like he has a lot of fluid retention.  He reports that since taking Lasix in the ED he has had good urine output.  Patient has no other complaints at this time.  Patient does not smoke and does not drink.  Patient is DNR DNI.  Upon chart review patient is supposed to be on the ampicillin/Rocephin combo until October 17. Review of Systems: As mentioned in the history of present illness. All other systems reviewed and are negative. Past Medical History:  Diagnosis Date   Bradycardia, unspecified    CAD (coronary artery disease)    NATIVE WITHOUT ANGINA   Hereditary and idiopathic neuropathy, unspecified    History of shingles    Hyperlipidemia     Hypertension    ESSENTIAL PRIMARY   Peripheral neuropathy    Past Surgical History:  Procedure Laterality Date   BIV PACEMAKER INSERTION CRT-P N/A 01/24/2022   Procedure: BIV PACEMAKER INSERTION CRT-P;  Surgeon: Lanier Prude, MD;  Location: [facility] INVASIVE CV LAB;  Service: Cardiovascular;  Laterality: N/A;   CARDIAC CATHETERIZATION Left 09/1999   NORMAL LEFT MAIN, OCCLUDED LAD, OCCLUDED MID CFX, OCCLUDED PROXIMAL RCA, 60% STENOSIS PROXIMAL DIAG 1, RIGHT TO LEFT COLLATERAL, LEFT TO RIGHT COLLATERAL; LVEF OF 48% DOCUMENTED VIA NUCLEAR STUDY ON 07/12/2009.   CARDIOVERSION N/A 10/05/2019   Procedure: CARDIOVERSION;  Surgeon: Thurmon Fair, MD;  Location: [facility] ENDOSCOPY;  Service: Cardiovascular;  Laterality: N/A;   CATARACT EXTRACTION     CORONARY ARTERY BYPASS GRAFT     w/ LIMA TO LAD, SVG TO dx, SVG TO OM, SVG TO AM-PD-PL 10/12/99 [facility]   REMOVAL OF ANKLE PLATE     Social History:  reports that he has quit smoking. He has never used smokeless tobacco. He reports that he does not drink alcohol and does not use drugs.  No Known Allergies  Family History  Problem Relation Age of Onset   Other Mother        MALIGNANT TUMOR OF OVARY   CVA Father     Prior to Admission medications   Medication Sig Start Date End Date Taking? Authorizing Provider  ampicillin IVPB Inject 8  g into the vein daily. As a continuous infusion Indication:  Endocarditis First Dose: Yes Last Day of Therapy:  09/30/2023 Labs - Once weekly:  CBC/D and BMP, Labs - Once weekly: ESR and CRP Method of administration: Ambulatory Pump (Continuous Infusion) Method of administration may be changed at the discretion of home infusion pharmacist based upon assessment of the patient and/or caregiver's ability to self-administer the medication ordered. 08/22/23 10/01/23 Yes Adhikari, Amrit, MD  bimatoprost (LUMIGAN) 0.01 % SOLN Place 1 drop into both eyes at bedtime.   Yes [provider]  cefTRIAXone (ROCEPHIN)  IVPB Inject 2 g into the vein every 12 (twelve) hours. Indication:  Endocarditis First Dose: Yes Last Day of Therapy:  09/30/2023 Labs - Once weekly:  CBC/D and BMP, Labs - Once weekly: ESR and CRP Method of administration: IV Push Method of administration may be changed at the discretion of home infusion pharmacist based upon assessment of the patient and/or caregiver's ability to self-administer the medication ordered. 08/22/23 10/01/23 Yes Burnadette Pop, MD  cinacalcet (SENSIPAR) 90 MG tablet Take 90 mg by mouth daily. 10/31/20  Yes [provider]  ELIQUIS 2.5 MG TABS tablet TAKE 1 TABLET TWICE A DAY 12/02/22  Yes Hochrein, Fayrene Fearing, MD  ezetimibe (ZETIA) 10 MG tablet TAKE 1 TABLET DAILY (NEED APPOINTMENT FOR FUTURE REFILLS) 08/10/23  Yes Rollene Rotunda, MD  gabapentin (NEURONTIN) 800 MG tablet Take 1 tablet (800 mg total) by mouth daily. Patient taking differently: Take 800 mg by mouth 3 (three) times daily. 08/22/23  Yes Adhikari, Willia Craze, MD  KRILL OIL PO Take 1 capsule by mouth daily.   Yes [provider]  loperamide (IMODIUM) 2 MG capsule Take 1 capsule (2 mg total) by mouth every 6 (six) hours as needed for diarrhea or loose stools. 08/22/23  Yes Burnadette Pop, MD  Multiple Vitamins-Minerals (PRESERVISION AREDS) CAPS Take 1 capsule by mouth 2 (two) times daily.   Yes [provider]  oxyCODONE-acetaminophen (PERCOCET) 7.5-325 MG tablet Take 1 tablet by mouth every 8 (eight) hours as needed for severe pain.   Yes [provider]  potassium chloride SA (KLOR-CON M) 20 MEQ tablet Take 1 tablet (20 mEq total) by mouth 2 (two) times daily. 09/02/23  Yes Derwood Kaplan, MD  simvastatin (ZOCOR) 80 MG tablet TAKE 1 TABLET DAILY 08/11/23  Yes Rollene Rotunda, MD    Physical Exam: Vitals:   09/09/23 1856 09/09/23 2212 09/10/23 0349 09/10/23 0554  BP:  (!) 142/87  (!) 153/73  Pulse: 73 75  66  Resp: 18 20  20   Temp:  98.2 F (36.8 C)  98 F (36.7 C)  TempSrc:   Oral    SpO2: 95% 95%  95%  Weight:  76.2 kg 76.2 kg   Height:       1.  General: Patient lying supine in bed,  no acute distress   2. Psychiatric: Alert and oriented x 3, mood and behavior normal for situation, pleasant and cooperative with exam   3. Neurologic: Speech and language are normal, face is symmetric, moves all 4 extremities voluntarily, at baseline without acute deficits on limited exam   4. HEENMT:  Head is atraumatic, normocephalic, pupils reactive to light, neck is supple, trachea is midline, mucous membranes are moist   5. Respiratory : Crackles on exam without wheezing, no cyanosis, no increase in work of breathing or accessory muscle use   6. Cardiovascular : Heart rate normal, rhythm is irregular, no murmurs, rubs or gallops, significant peripheral  edema, peripheral pulses palpated   7. Gastrointestinal:  Abdomen is soft, nondistended, nontender to palpation bowel sounds active, no masses or organomegaly palpated   8. Skin:  Skin is warm, dry and intact without rashes, acute lesions, or ulcers on limited exam   9.Musculoskeletal:  No acute deformities or trauma, no asymmetry in tone, significant peripheral edema, peripheral pulses palpated, no tenderness to palpation in the extremities  Data Reviewed: In the ED Temp 98.2, heart rate 67-94, respiratory rate 17-18, blood pressure 125/73-138/84, satting 92-95% No leukocytosis with a white blood cell count of 10.3, hemoglobin 10.7, platelets 200 Chemistry reveals an AKI with a creatinine of 1.47 up from 1.09 - Transaminitis with AST 311, ALT 205 - BNP elevated at 932 - Trope 30, repeat pending Chest x-ray shows cardiomegaly, pulmonary vascular congestion, pulmonary edema, bilateral pleural effusions EKG shows a heart rate of 68, ventricular pacemaker, QTc 524 Lasix 40 mg IV given in the ED Admission requested for CHF exacerbation  Assessment and Plan: * CHF (congestive heart failure) (HCC) -  Cardiomegaly, pulmonary vascular congestion, pulmonary edema, bilateral pleural effusions on chest x-ray - Dyspnea on exertion - Orthopnea - Lasix was discontinued at last hospitalization - Crackles on exam - Diuresis initiated in the ED with 40 mg IV Lasix - Continue 60 mg IV Lasix twice daily - Monitor intake and output - Fluid restrictions - Continue to monitor  Paroxysmal atrial fibrillation (HCC) - Continue Eliquis  Dyslipidemia - Holding Zocor in the setting of transaminitis  AKI (acute kidney injury) (HCC) - Creatinine up to 1.47 from 1.09 - Likely related to cardiorenal syndrome - Hold nephrotoxic agents when possible - Continue diuresis - Trend in the a.m.  Bacteremia - At last hospitalization was diagnosed with Enterococcus faecalis bacteremia - Was discharged on 6 weeks of ampicillin and Rocephin - Continue ampicillin and Rocephin - Given that he did have bacteremia recently and now that he is in acute heart failure, will repeat echo even though 1 was just done earlier this month to evaluate valves, signs for endocarditis - Continue to monitor  Transaminitis - AST up to 311 from 28 - ALT up to 205 from 23 - At this time most likely related to vascular congestion - Right upper quadrant ultrasound for completeness - Hepatitis panel for completeness - Hold Zocor - Trend in the a.m.  Peripheral neuropathy - Continue gabapentin at a reduced dose given AKI  Essential hypertension - Blood pressure currently controlled at 125/73-138/84 - Continue to monitor      Advance Care Planning:   Code Status: Limited: Do not attempt resuscitation (DNR) -DNR-LIMITED -Do Not Intubate/DNI   Consults: None at this time  Family Communication: No family at bedside  Severity of Illness: The appropriate patient status for this patient is INPATIENT. Inpatient status is judged to be reasonable and necessary in order to provide the required intensity of service to ensure the  patient's safety. The patient's presenting symptoms, physical exam findings, and initial radiographic and laboratory data in the context of their chronic comorbidities is felt to place them at high risk for further clinical deterioration. Furthermore, it is not anticipated that the patient will be medically stable for discharge from the hospital within 2 midnights of admission.   * I certify that at the point of admission it is my clinical judgment that the patient will require inpatient hospital care spanning beyond 2 midnights from the point of admission due to high intensity of service, high risk for further deterioration  and high frequency of surveillance required.*  Author: Lilyan Gilford, DO 09/10/2023 5:56 AM  For on call review [email or web address].

## 2023-09-10 NOTE — Assessment & Plan Note (Signed)
-   Creatinine up to 1.47 from 1.09 - Likely related to cardiorenal syndrome - Hold nephrotoxic agents when possible - Continue diuresis - Trend in the a.m.

## 2023-09-10 NOTE — Progress Notes (Signed)
PROGRESS NOTE    Kevin Collins  ZOX:[id number] DOB: [birth year] DOA: 09/09/2023 PCP: Toma Deiters, MD   Brief Narrative:    Kevin Collins is a 89 y.o. male with medical history significant of coronary artery disease, hyperlipidemia, hypertension, peripheral neuropathy, recent admission for bacteremia, pacemaker in place, and more presents the ED with a chief complaint of orthopnea.  Patient reports that he wakes up in the middle of the night feeling dizzy and like he might pass out and very short of breath.  He reports that this feeling can last throughout the day until 430 or 5 PM.  He reports it has been going on since his Lasix was discontinued at his last hospitalization.  Patient has been admitted with acute HFpEF.  Assessment & Plan:   Principal Problem:   CHF (congestive heart failure) (HCC) Active Problems:   Dyslipidemia   Paroxysmal atrial fibrillation (HCC)   Essential hypertension   Peripheral neuropathy   Transaminitis   Bacteremia   AKI (acute kidney injury) (HCC)  Assessment and Plan:  Acute HFpEF - Cardiomegaly, pulmonary vascular congestion, pulmonary edema, bilateral pleural effusions on chest x-ray - Dyspnea on exertion - Orthopnea - Lasix was discontinued at last hospitalization - Crackles on exam - Diuresis initiated in the ED with 40 mg IV Lasix - Continue 60 mg IV Lasix twice daily - Monitor intake and output - Fluid restrictions - Continue to monitor   Paroxysmal atrial fibrillation (HCC) - Continue Eliquis   Dyslipidemia - Holding Zocor in the setting of transaminitis   AKI (acute kidney injury) (HCC)-improving - Likely related to cardiorenal syndrome - Hold nephrotoxic agents when possible - Continue diuresis - Trend in the a.m.  Hypomagnesemia -Replete and reevaluate in a.m.   Bacteremia - At last hospitalization was diagnosed with Enterococcus faecalis bacteremia - Was discharged on 6 weeks of ampicillin and Rocephin - Continue  ampicillin and Rocephin - Given that he did have bacteremia recently and now that he is in acute heart failure, will repeat echo even though 1 was just done earlier this month to evaluate valves, signs for endocarditis - Continue to monitor -Will require treatment through 10/17   Transaminitis-worsening - Upward trend noted - At this time most likely related to vascular congestion - Right upper quadrant ultrasound for completeness pending - Hepatitis panel for completeness still pending - Hold Zocor - Trend in the a.m.   Peripheral neuropathy - Continue gabapentin at a reduced dose given AKI   Essential hypertension - Blood pressure currently controlled at 125/73-138/84 - Continue to monitor    DVT prophylaxis:apixaban Code Status: DNR Family Communication: None at bedside Disposition Plan:  Status is: Inpatient Remains inpatient appropriate because: Continues to require IV diuresis   Consultants:  None  Procedures:  None  Antimicrobials:  Anti-infectives (From admission, onward)    Start     Dose/Rate Route Frequency Ordered Stop   09/10/23 0000  ampicillin (OMNIPEN) 2 g in sodium chloride 0.9 % 100 mL IVPB        2 g 300 mL/hr over 20 Minutes Intravenous Every 6 hours 09/09/23 2319     09/09/23 2315  ampicillin  Status:  Discontinued       Note to Pharmacy: As a continuous infusion Indication:  Endocarditis First Dose: Yes Last Day of Therapy:  09/30/2023 Labs - Once weekly:  CBC/D and BMP, Labs - Once weekly: ESR and CRP   8 g Intravenous Every 24 hours 09/09/23 2216 09/09/23 2319   09/09/23  2315  cefTRIAXone (ROCEPHIN) IVPB  Status:  Discontinued       Note to Pharmacy: Indication:  Endocarditis First Dose: Yes Last Day of Therapy:  09/30/2023 Labs - Once weekly:  CBC/D and BMP, Labs - Once weekly: ESR and CRP   2 g Intravenous Every 12 hours 09/09/23 2216 09/09/23 2220   09/09/23 2300  cefTRIAXone (ROCEPHIN) 2 g in sodium chloride 0.9 % 100 mL IVPB         2 g 200 mL/hr over 30 Minutes Intravenous Every 12 hours 09/09/23 2220         Subjective: Patient seen and evaluated today with no new acute complaints or concerns. No acute concerns or events noted overnight.  He continues to have significant shortness of breath and would like a Foley catheter placed to so that he does not have to run to the bathroom all the time.  Objective: Vitals:   09/09/23 2212 09/10/23 0349 09/10/23 0554 09/10/23 0801  BP: (!) 142/87  (!) 153/73 (!) 144/76  Pulse: 75  66 75  Resp: 20  20 (!) 23  Temp: 98.2 F (36.8 C)  98 F (36.7 C)   TempSrc: Oral     SpO2: 95%  95% 97%  Weight: 76.2 kg 76.2 kg    Height:        Intake/Output Summary (Last 24 hours) at 09/10/2023 1041 Last data filed at 09/10/2023 0944 Gross per 24 hour  Intake 320 ml  Output 1600 ml  Net -1280 ml   Filed Weights   09/09/23 1804 09/09/23 2212 09/10/23 0349  Weight: 76.2 kg 76.2 kg 76.2 kg    Examination:  General exam: Appears calm and comfortable  Respiratory system: Crackles bilaterally.  Respiratory effort normal. Cardiovascular system: S1 & S2 heard, RRR.  Gastrointestinal system: Abdomen is soft Central nervous system: Alert and awake Extremities: No edema Skin: No significant lesions noted Psychiatry: Flat affect.    Data Reviewed: I have personally reviewed following labs and imaging studies  CBC: Recent Labs  Lab 09/09/23 1849 09/10/23 0412  WBC 10.3 10.1  NEUTROABS  --  7.1  HGB 10.7* 10.4*  HCT 34.5* 34.7*  MCV 98.9 98.9  PLT 200 173   Basic Metabolic Panel: Recent Labs  Lab 09/09/23 1849 09/10/23 0412  NA 137 138  K 4.4 3.8  CL 97* 97*  CO2 25 25  GLUCOSE 107* 93  BUN 14 16  CREATININE 1.47* 1.37*  CALCIUM 7.8* 7.8*  MG  --  1.3*   GFR: Estimated Creatinine Clearance: 36.3 mL/min (A) (by C-G formula based on SCr of 1.37 mg/dL (H)). Liver Function Tests: Recent Labs  Lab 09/09/23 1849 09/10/23 0412  AST 311* 559*  ALT 205* 761*   ALKPHOS 53 51  BILITOT 1.0 0.7  PROT 6.3* 6.1*  ALBUMIN 3.5 3.3*   No results for input(s): "LIPASE", "AMYLASE" in the last 168 hours. No results for input(s): "AMMONIA" in the last 168 hours. Coagulation Profile: No results for input(s): "INR", "PROTIME" in the last 168 hours. Cardiac Enzymes: No results for input(s): "CKTOTAL", "CKMB", "CKMBINDEX", "TROPONINI" in the last 168 hours. BNP (last 3 results) No results for input(s): "PROBNP" in the last 8760 hours. HbA1C: No results for input(s): "HGBA1C" in the last 72 hours. CBG: No results for input(s): "GLUCAP" in the last 168 hours. Lipid Profile: No results for input(s): "CHOL", "HDL", "LDLCALC", "TRIG", "CHOLHDL", "LDLDIRECT" in the last 72 hours. Thyroid Function Tests: Recent Labs    09/10/23 [phone]  TSH 4.006   Anemia Panel: No results for input(s): "VITAMINB12", "FOLATE", "FERRITIN", "TIBC", "IRON", "RETICCTPCT" in the last 72 hours. Sepsis Labs: No results for input(s): "PROCALCITON", "LATICACIDVEN" in the last 168 hours.  No results found for this or any previous visit (from the past 240 hour(s)).       Radiology Studies: DG Chest Port 1 View  Result Date: 09/09/2023 CLINICAL DATA:  Weakness EXAM: PORTABLE CHEST 1 VIEW COMPARISON:  Chest x-ray 09/02/2023 FINDINGS: Right upper extremity PICC terminates in the SVC. Left-sided ICD and sternotomy wires are again seen. The heart is enlarged. There central pulmonary vascular congestion. There central interstitial opacities as well likely related to edema. There are small bilateral pleural effusions, right greater than left. There are patchy opacities in the left lung base. There is no pneumothorax. IMPRESSION: 1. Cardiomegaly with central pulmonary vascular congestion and central interstitial opacities likely related to edema. 2. Small bilateral pleural effusions, right greater than left. 3. Patchy opacities in the left lung base, likely atelectasis. Electronically Signed    By: Darliss Cheney M.D.   On: 09/09/2023 19:51        Scheduled Meds:  apixaban  2.5 mg Oral BID   Chlorhexidine Gluconate Cloth  6 each Topical Q0600   cinacalcet  90 mg Oral Q breakfast   ezetimibe  10 mg Oral Daily   furosemide  60 mg Intravenous BID   gabapentin  400 mg Oral Daily   Continuous Infusions:  ampicillin (OMNIPEN) IV 2 g (09/10/23 4098)   cefTRIAXone (ROCEPHIN)  IV 2 g (09/09/23 2251)   magnesium sulfate bolus IVPB 2 g (09/10/23 0944)     LOS: 1 day    Time spent: 35 minutes    Elonna Mcfarlane D Sherryll Burger, DO [facility]  If 7PM-7AM, please contact night-coverage [email or web address] 09/10/2023, 10:41 AM

## 2023-09-10 NOTE — Progress Notes (Signed)
Deferred Screening flowsheet due to patient condition. Patient very short of breath making talking difficult and increasing his work of breathing. Screening to be completed when this improves.

## 2023-09-10 NOTE — Assessment & Plan Note (Signed)
-   Holding Zocor in the setting of transaminitis

## 2023-09-10 NOTE — Progress Notes (Signed)
*  PRELIMINARY RESULTS* Echocardiogram 2D Echocardiogram has been performed.  Stacey Drain 09/10/2023, 10:29 AM

## 2023-09-10 NOTE — Assessment & Plan Note (Signed)
-   Continue Eliquis 

## 2023-09-10 NOTE — Assessment & Plan Note (Signed)
-   AST up to 311 from 28 - ALT up to 205 from 23 - At this time most likely related to vascular congestion - Right upper quadrant ultrasound for completeness - Hepatitis panel for completeness - Hold Zocor - Trend in the a.m.

## 2023-09-10 NOTE — TOC Initial Note (Signed)
Transition of Care [facility]) - Initial/Assessment Note    Patient Details  Name: Kevin Collins MRN: [id number] Date of Birth: 19-Apr-1930  Transition of Care [facility]) CM/SW Contact:    Karn Cassis, LCSW Phone Number: 09/10/2023, 10:12 AM  Clinical Narrative: Pt admitted due to CHF. TOC received consult for CHF screening. Pt lives with 2 of his sons and his 3rd son is also very involved. He has been active with [facility] for the past 2 weeks for IV antibiotics. Cory with [facility] and Pam with [facility] notified of admission. Will need resumption HHRN orders at d/c. Pt is fairly independent with ADLs at baseline. Plan is to return home when medically stable. TOC will follow. Pt's son, Casimiro Needle indicates pt was diagnosed with CHF many years ago. He weighs himself most days and follows a heart healthy diet fairly well. His appetite has decreased since being on IV antibiotics. Pt takes medications as prescribed. CHF education added to AVS.                   Barriers to Discharge: Continued Medical Work up   Patient Goals and CMS Choice Patient states their goals for this hospitalization and ongoing recovery are:: return home          Expected Discharge Plan and Services In-house Referral: Clinical Social Work     Living arrangements for the past 2 months: Single Family Home                           HH Arranged: RN HH Agency: [facility] Date [facility] Agency Contacted: 09/10/23 Time HH Agency Contacted: 1012 Representative spoke with at [facility] Agency: Kandee Keen and Elita Quick  Prior Living Arrangements/Services Living arrangements for the past 2 months: Single Family Home Lives with:: Adult Children Patient language and need for interpreter reviewed:: Yes Do you feel safe going back to the place where you live?: Yes      Need for Family Participation in Patient Care: Yes (Comment) Care giver support system in place?: Yes (comment) Current home services: Home RN Criminal  Activity/Legal Involvement Pertinent to Current Situation/Hospitalization: No - Comment as needed  Activities of Daily Living      Permission Sought/Granted                  Emotional Assessment         Alcohol / Substance Use: Not Applicable Psych Involvement: No (comment)  Admission diagnosis:  CHF (congestive heart failure) (HCC) [I50.9] Generalized edema [R60.1] Elevated troponin [R79.89] Elevated LFTs [R79.89] Elevated brain natriuretic peptide (BNP) level [R79.89] Acute on chronic combined systolic and diastolic CHF (congestive heart failure) (HCC) [I50.43] Patient Active Problem List   Diagnosis Date Noted   Transaminitis 09/10/2023   Bacteremia 09/10/2023   AKI (acute kidney injury) (HCC) 09/10/2023   CHF (congestive heart failure) (HCC) 09/09/2023   Pain in left foot 08/21/2023   Peripheral neuropathy 08/19/2023   Paroxysmal atrial fibrillation (HCC) 08/19/2023   Sepsis due to undetermined organism (HCC) 08/18/2023   Symptomatic bradycardia 01/24/2022   Bradycardia 05/07/2020   Nonrheumatic aortic valve insufficiency 05/07/2020   Acute on chronic combined systolic and diastolic HF (heart failure) (HCC) 05/07/2020   Alcoholic cardiomyopathy (HCC) 82/95/6213   Acute heart failure (HCC) 08/31/2019   Medication management 08/31/2019   Atrial fibrillation (HCC) 08/31/2019   Coronary artery disease involving native coronary artery of native heart without angina pectoris 05/25/2019   Essential hypertension 05/25/2019  Dyslipidemia 05/25/2019   Educated about COVID-19 virus infection 05/25/2019   PCP:  Toma Deiters, MD Pharmacy:   [facility] - [place], [place] - [address] [place] [address] Phone: [phone] Fax: [phone]  [facility] - [place], [place] - [address] [place] [address] Phone: [phone] Fax: [phone]     Social Determinants of Health  (SDOH) Social History: SDOH Screenings   Food Insecurity: No Food Insecurity (08/18/2023)  Housing: Low Risk  (08/18/2023)  Transportation Needs: No Transportation Needs (08/18/2023)  Utilities: Not At Risk (08/18/2023)  Tobacco Use: Medium Risk (09/09/2023)   SDOH Interventions:     Readmission Risk Interventions    08/21/2023    2:54 PM  Readmission Risk Prevention Plan  Transportation Screening Complete  PCP or Specialist Appt within 5-7 Days Complete  Home Care Screening Complete  Medication Review (RN CM) Complete

## 2023-09-10 NOTE — Assessment & Plan Note (Signed)
-   Continue gabapentin at a reduced dose given AKI

## 2023-09-10 NOTE — Assessment & Plan Note (Signed)
-   Cardiomegaly, pulmonary vascular congestion, pulmonary edema, bilateral pleural effusions on chest x-ray - Dyspnea on exertion - Orthopnea - Lasix was discontinued at last hospitalization - Crackles on exam - Diuresis initiated in the ED with 40 mg IV Lasix - Continue 60 mg IV Lasix twice daily - Monitor intake and output - Fluid restrictions - Continue to monitor

## 2023-09-10 NOTE — Progress Notes (Signed)
   09/10/23 0800  ReDS Vest / Clip  Station Marker C  Ruler Value 30  ReDS Value Range 36 - 40  ReDS Actual Value 40

## 2023-09-11 ENCOUNTER — Inpatient Hospital Stay (HOSPITAL_COMMUNITY): Payer: Medicare PPO

## 2023-09-11 ENCOUNTER — Other Ambulatory Visit: Payer: Self-pay

## 2023-09-11 DIAGNOSIS — I5023 Acute on chronic systolic (congestive) heart failure: Secondary | ICD-10-CM

## 2023-09-11 DIAGNOSIS — I38 Endocarditis, valve unspecified: Secondary | ICD-10-CM | POA: Diagnosis not present

## 2023-09-11 LAB — COMPREHENSIVE METABOLIC PANEL
ALT: 280 U/L — ABNORMAL HIGH (ref 0–44)
AST: 264 U/L — ABNORMAL HIGH (ref 15–41)
Albumin: 3.2 g/dL — ABNORMAL LOW (ref 3.5–5.0)
Alkaline Phosphatase: 51 U/L (ref 38–126)
Anion gap: 16 — ABNORMAL HIGH (ref 5–15)
BUN: 19 mg/dL (ref 8–23)
CO2: 28 mmol/L (ref 22–32)
Calcium: 8 mg/dL — ABNORMAL LOW (ref 8.9–10.3)
Chloride: 94 mmol/L — ABNORMAL LOW (ref 98–111)
Creatinine, Ser: 1.48 mg/dL — ABNORMAL HIGH (ref 0.61–1.24)
GFR, Estimated: 44 mL/min — ABNORMAL LOW (ref >60)
Glucose, Bld: 95 mg/dL (ref 70–99)
Potassium: 3.4 mmol/L — ABNORMAL LOW (ref 3.5–5.1)
Sodium: 138 mmol/L (ref 135–145)
Total Bilirubin: 0.8 mg/dL (ref 0.3–1.2)
Total Protein: 5.8 g/dL — ABNORMAL LOW (ref 6.5–8.1)

## 2023-09-11 LAB — CBC
HCT: 34.8 % — ABNORMAL LOW (ref 39.0–52.0)
Hemoglobin: 10.7 g/dL — ABNORMAL LOW (ref 13.0–17.0)
MCH: 30.1 pg (ref 26.0–34.0)
MCHC: 30.7 g/dL (ref 30.0–36.0)
MCV: 98 fL (ref 80.0–100.0)
Platelets: 166 10*3/uL (ref 150–400)
RBC: 3.55 MIL/uL — ABNORMAL LOW (ref 4.22–5.81)
RDW: 14.9 % (ref 11.5–15.5)
WBC: 10.6 10*3/uL — ABNORMAL HIGH (ref 4.0–10.5)
nRBC: 0 % (ref 0.0–0.2)

## 2023-09-11 LAB — GLUCOSE, CAPILLARY: Glucose-Capillary: 100 mg/dL — ABNORMAL HIGH (ref 70–99)

## 2023-09-11 LAB — MAGNESIUM: Magnesium: 1.6 mg/dL — ABNORMAL LOW (ref 1.7–2.4)

## 2023-09-11 MED ORDER — MAGNESIUM SULFATE 2 GM/50ML IV SOLN
2.0000 g | Freq: Once | INTRAVENOUS | Status: AC
  Administered 2023-09-11: 2 g via INTRAVENOUS
  Filled 2023-09-11: qty 50

## 2023-09-11 MED ORDER — LIDOCAINE HCL (PF) 2 % IJ SOLN
INTRAMUSCULAR | Status: AC
  Filled 2023-09-11: qty 10

## 2023-09-11 MED ORDER — SPIRONOLACTONE 12.5 MG HALF TABLET
12.5000 mg | ORAL_TABLET | Freq: Every day | ORAL | Status: DC
  Administered 2023-09-11 – 2023-09-14 (×4): 12.5 mg via ORAL
  Filled 2023-09-11 (×4): qty 1

## 2023-09-11 MED ORDER — ENSURE ENLIVE PO LIQD
237.0000 mL | Freq: Two times a day (BID) | ORAL | Status: DC
  Administered 2023-09-12 – 2023-09-14 (×5): 237 mL via ORAL

## 2023-09-11 MED ORDER — MELATONIN 3 MG PO TABS
6.0000 mg | ORAL_TABLET | Freq: Every day | ORAL | Status: DC
  Administered 2023-09-11 – 2023-09-13 (×4): 6 mg via ORAL
  Filled 2023-09-11 (×4): qty 2

## 2023-09-11 MED ORDER — LOSARTAN POTASSIUM 25 MG PO TABS
12.5000 mg | ORAL_TABLET | Freq: Every day | ORAL | Status: DC
  Administered 2023-09-11 – 2023-09-14 (×4): 12.5 mg via ORAL
  Filled 2023-09-11 (×4): qty 1

## 2023-09-11 MED ORDER — POTASSIUM CHLORIDE CRYS ER 20 MEQ PO TBCR
40.0000 meq | EXTENDED_RELEASE_TABLET | Freq: Two times a day (BID) | ORAL | Status: AC
  Administered 2023-09-11 (×2): 40 meq via ORAL
  Filled 2023-09-11 (×2): qty 2

## 2023-09-11 NOTE — Progress Notes (Signed)
MD made aware pt had 17 beat run of Vtach at 1035 and 6 beat run of Vtach at 1320, pt denies pain or discomfort and vitals WNLs, no new orders received.

## 2023-09-11 NOTE — Progress Notes (Signed)
PROGRESS NOTE    Kevin Collins  OFB:[id number] DOB: 1930-09-26 DOA: 09/09/2023 PCP: Toma Deiters, MD   Brief Narrative:    Kevin Collins is a 89 y.o. male with medical history significant of coronary artery disease, hyperlipidemia, hypertension, peripheral neuropathy, recent admission for bacteremia, pacemaker in place, and more presents the ED with a chief complaint of orthopnea.  Patient reports that he wakes up in the middle of the night feeling dizzy and like he might pass out and very short of breath.  He reports that this feeling can last throughout the day until 430 or 5 PM.  He reports it has been going on since his Lasix was discontinued at his last hospitalization.  Patient has been admitted with acute HFrEF.  Assessment & Plan:   Principal Problem:   CHF (congestive heart failure) (HCC) Active Problems:   Dyslipidemia   Paroxysmal atrial fibrillation (HCC)   Essential hypertension   Peripheral neuropathy   Transaminitis   Bacteremia   AKI (acute kidney injury) (HCC)  Assessment and Plan:  Acute HFrEF - Appreciate cardiology recommendations with spironolactone 12.5 mg daily added today -2D echocardiogram with EF 35-40% noted - Continue 60 mg IV Lasix twice daily - Monitor intake and output - Fluid restrictions - Continue to monitor -Right-sided thoracentesis performed today with 700 mL fluid output   Paroxysmal atrial fibrillation (HCC) - Continue Eliquis   Dyslipidemia - Holding Zocor in the setting of transaminitis   AKI (acute kidney injury) (HCC)-improving - Likely related to cardiorenal syndrome - Hold nephrotoxic agents when possible - Continue diuresis - Trend in the a.m.  Hypokalemia/Hypomagnesemia -Replete and reevaluate in a.m.   Bacteremia - At last hospitalization was diagnosed with Enterococcus faecalis bacteremia - Was discharged on 6 weeks of ampicillin and Rocephin - Continue ampicillin and Rocephin - Given that he did have bacteremia  recently and now that he is in acute heart failure, will repeat echo even though 1 was just done earlier this month to evaluate valves, signs for endocarditis - Continue to monitor -Will require treatment through 10/17   Transaminitis-improving - Upward trend noted - At this time most likely related to vascular congestion - Right upper quadrant ultrasound with findings of cirrhosis and will need outpatient GI evaluation - Hepatitis panel negative - Hold Zocor - Trend in the a.m.   Peripheral neuropathy - Continue gabapentin at a reduced dose given AKI   Essential hypertension - Currently controlled, continue to monitor    DVT prophylaxis:apixaban Code Status: DNR Family Communication: None at bedside Disposition Plan:  Status is: Inpatient Remains inpatient appropriate because: Continues to require IV diuresis   Consultants:  Cardiology  Procedures:  Right thoracentesis 9/27 with 700 mL fluid output  Antimicrobials:  Anti-infectives (From admission, onward)    Start     Dose/Rate Route Frequency Ordered Stop   09/10/23 0000  ampicillin (OMNIPEN) 2 g in sodium chloride 0.9 % 100 mL IVPB        2 g 300 mL/hr over 20 Minutes Intravenous Every 6 hours 09/09/23 2319     09/09/23 2315  ampicillin  Status:  Discontinued       Note to Pharmacy: As a continuous infusion Indication:  Endocarditis First Dose: Yes Last Day of Therapy:  09/30/2023 Labs - Once weekly:  CBC/D and BMP, Labs - Once weekly: ESR and CRP   8 g Intravenous Every 24 hours 09/09/23 2216 09/09/23 2319   09/09/23 2315  cefTRIAXone (ROCEPHIN) IVPB  Status:  Discontinued  LDLCALC", "TRIG", "CHOLHDL", "LDLDIRECT" in the last 72 hours. Thyroid Function Tests: Recent Labs    09/10/23 0412  TSH 4.006   Anemia Panel: No results for input(s): "VITAMINB12", "FOLATE", "FERRITIN", "TIBC", "IRON", "RETICCTPCT" in the last 72 hours. Sepsis Labs: No results for input(s): "PROCALCITON", "LATICACIDVEN" in the last 168 hours.  No results found for this or any previous visit (from the past 240 hour(s)).       Radiology Studies: DG Chest 1 View  Result Date: 09/11/2023 CLINICAL DATA:  Status post right thoracentesis. EXAM: CHEST  1 VIEW COMPARISON:  09/09/2023 FINDINGS: Slightly improved aeration at the right lung base compatible with recent thoracentesis. Negative for pneumothorax. Right arm PICC line. PICC line tip is difficult evaluate but appears to be near the cavoatrial junction. Stable appearance of the left cardiac device. Heart size is stable. Prominent vascular markings with hazy lung densities bilaterally. Findings could be related to vascular congestion or pulmonary edema. Persistent hazy densities at the left lung base. IMPRESSION: 1. Slightly improved  aeration at the right lung base. Negative for pneumothorax. 2. Persistent hazy lung densities bilaterally. Findings could be related to vascular congestion or pulmonary edema. Electronically Signed   By: Richarda Overlie M.D.   On: 09/11/2023 12:28   US THORACENTESIS ASP PLEURAL SPACE W/IMG GUIDE  Result Date: 09/11/2023 INDICATION: Patient with history of CHF, new onset pleural effusion. Request for therapeutic right thoracentesis. EXAM: ULTRASOUND GUIDED RIGHT THORACENTESIS MEDICATIONS: 6 mL 1% lidocaine COMPLICATIONS: None immediate. PROCEDURE: An ultrasound guided thoracentesis was thoroughly discussed with the patient and questions answered. The benefits, risks, alternatives and complications were also discussed. The patient understands and wishes to proceed with the procedure. Written consent was obtained. Ultrasound was performed to localize and mark an adequate pocket of fluid in the right chest. The area was then prepped and draped in the normal sterile fashion. 1% Lidocaine was used for local anesthesia. Under ultrasound guidance a 6 Fr Safe-T-Centesis catheter was introduced. Thoracentesis was performed. The catheter was removed and a dressing applied. FINDINGS: A total of approximately 700 mL of clear yellow fluid was removed. IMPRESSION: Successful ultrasound guided right thoracentesis yielding 700 mL of pleural fluid. Performed by Lynnette Caffey, PA-C Electronically Signed   By: Richarda Overlie M.D.   On: 09/11/2023 12:05   US Abdomen Limited RUQ (LIVER/GB)  Result Date: 09/10/2023 CLINICAL DATA:  Transaminitis EXAM: ULTRASOUND ABDOMEN LIMITED RIGHT UPPER QUADRANT COMPARISON:  None Available. FINDINGS: Gallbladder: Gallstones. No gallbladder wall thickening. No sonographic Murphy sign noted by sonographer. Common bile duct: Diameter: 0.57 Liver: No focal lesion identified. Mildly coarse contour of the liver. Increased parenchymal echogenicity. Portal vein is patent on color Doppler imaging with normal  direction of blood flow towards the liver. Other: Small volume ascites.  Small right pleural IMPRESSION: 1. Increased hepatic parenchymal echogenicity and mildly coarse contour of the liver, consistent hepatic steatosis and suspicious for cirrhosis. 2. Small volume ascites. 3. Small right pleural effusion. 4. Cholelithiasis without evidence of acute cholecystitis. Electronically Signed   By: Jearld Lesch M.D.   On: 09/10/2023 11:46   ECHOCARDIOGRAM COMPLETE  Result Date: 09/10/2023    ECHOCARDIOGRAM REPORT   Patient Name:   MASAO JUNKER Date of Exam: 09/10/2023 Medical Rec #:  [id number]   Height:       70.0 in Accession #:    [id number]  Weight:       168.0 lb Date of Birth:  1930/07/20   BSA:  LDLCALC", "TRIG", "CHOLHDL", "LDLDIRECT" in the last 72 hours. Thyroid Function Tests: Recent Labs    09/10/23 0412  TSH 4.006   Anemia Panel: No results for input(s): "VITAMINB12", "FOLATE", "FERRITIN", "TIBC", "IRON", "RETICCTPCT" in the last 72 hours. Sepsis Labs: No results for input(s): "PROCALCITON", "LATICACIDVEN" in the last 168 hours.  No results found for this or any previous visit (from the past 240 hour(s)).       Radiology Studies: DG Chest 1 View  Result Date: 09/11/2023 CLINICAL DATA:  Status post right thoracentesis. EXAM: CHEST  1 VIEW COMPARISON:  09/09/2023 FINDINGS: Slightly improved aeration at the right lung base compatible with recent thoracentesis. Negative for pneumothorax. Right arm PICC line. PICC line tip is difficult evaluate but appears to be near the cavoatrial junction. Stable appearance of the left cardiac device. Heart size is stable. Prominent vascular markings with hazy lung densities bilaterally. Findings could be related to vascular congestion or pulmonary edema. Persistent hazy densities at the left lung base. IMPRESSION: 1. Slightly improved  aeration at the right lung base. Negative for pneumothorax. 2. Persistent hazy lung densities bilaterally. Findings could be related to vascular congestion or pulmonary edema. Electronically Signed   By: Richarda Overlie M.D.   On: 09/11/2023 12:28   US THORACENTESIS ASP PLEURAL SPACE W/IMG GUIDE  Result Date: 09/11/2023 INDICATION: Patient with history of CHF, new onset pleural effusion. Request for therapeutic right thoracentesis. EXAM: ULTRASOUND GUIDED RIGHT THORACENTESIS MEDICATIONS: 6 mL 1% lidocaine COMPLICATIONS: None immediate. PROCEDURE: An ultrasound guided thoracentesis was thoroughly discussed with the patient and questions answered. The benefits, risks, alternatives and complications were also discussed. The patient understands and wishes to proceed with the procedure. Written consent was obtained. Ultrasound was performed to localize and mark an adequate pocket of fluid in the right chest. The area was then prepped and draped in the normal sterile fashion. 1% Lidocaine was used for local anesthesia. Under ultrasound guidance a 6 Fr Safe-T-Centesis catheter was introduced. Thoracentesis was performed. The catheter was removed and a dressing applied. FINDINGS: A total of approximately 700 mL of clear yellow fluid was removed. IMPRESSION: Successful ultrasound guided right thoracentesis yielding 700 mL of pleural fluid. Performed by Lynnette Caffey, PA-C Electronically Signed   By: Richarda Overlie M.D.   On: 09/11/2023 12:05   US Abdomen Limited RUQ (LIVER/GB)  Result Date: 09/10/2023 CLINICAL DATA:  Transaminitis EXAM: ULTRASOUND ABDOMEN LIMITED RIGHT UPPER QUADRANT COMPARISON:  None Available. FINDINGS: Gallbladder: Gallstones. No gallbladder wall thickening. No sonographic Murphy sign noted by sonographer. Common bile duct: Diameter: 0.57 Liver: No focal lesion identified. Mildly coarse contour of the liver. Increased parenchymal echogenicity. Portal vein is patent on color Doppler imaging with normal  direction of blood flow towards the liver. Other: Small volume ascites.  Small right pleural IMPRESSION: 1. Increased hepatic parenchymal echogenicity and mildly coarse contour of the liver, consistent hepatic steatosis and suspicious for cirrhosis. 2. Small volume ascites. 3. Small right pleural effusion. 4. Cholelithiasis without evidence of acute cholecystitis. Electronically Signed   By: Jearld Lesch M.D.   On: 09/10/2023 11:46   ECHOCARDIOGRAM COMPLETE  Result Date: 09/10/2023    ECHOCARDIOGRAM REPORT   Patient Name:   MASAO JUNKER Date of Exam: 09/10/2023 Medical Rec #:  [id number]   Height:       70.0 in Accession #:    [id number]  Weight:       168.0 lb Date of Birth:  1930/07/20   BSA:  LDLCALC", "TRIG", "CHOLHDL", "LDLDIRECT" in the last 72 hours. Thyroid Function Tests: Recent Labs    09/10/23 0412  TSH 4.006   Anemia Panel: No results for input(s): "VITAMINB12", "FOLATE", "FERRITIN", "TIBC", "IRON", "RETICCTPCT" in the last 72 hours. Sepsis Labs: No results for input(s): "PROCALCITON", "LATICACIDVEN" in the last 168 hours.  No results found for this or any previous visit (from the past 240 hour(s)).       Radiology Studies: DG Chest 1 View  Result Date: 09/11/2023 CLINICAL DATA:  Status post right thoracentesis. EXAM: CHEST  1 VIEW COMPARISON:  09/09/2023 FINDINGS: Slightly improved aeration at the right lung base compatible with recent thoracentesis. Negative for pneumothorax. Right arm PICC line. PICC line tip is difficult evaluate but appears to be near the cavoatrial junction. Stable appearance of the left cardiac device. Heart size is stable. Prominent vascular markings with hazy lung densities bilaterally. Findings could be related to vascular congestion or pulmonary edema. Persistent hazy densities at the left lung base. IMPRESSION: 1. Slightly improved  aeration at the right lung base. Negative for pneumothorax. 2. Persistent hazy lung densities bilaterally. Findings could be related to vascular congestion or pulmonary edema. Electronically Signed   By: Richarda Overlie M.D.   On: 09/11/2023 12:28   US THORACENTESIS ASP PLEURAL SPACE W/IMG GUIDE  Result Date: 09/11/2023 INDICATION: Patient with history of CHF, new onset pleural effusion. Request for therapeutic right thoracentesis. EXAM: ULTRASOUND GUIDED RIGHT THORACENTESIS MEDICATIONS: 6 mL 1% lidocaine COMPLICATIONS: None immediate. PROCEDURE: An ultrasound guided thoracentesis was thoroughly discussed with the patient and questions answered. The benefits, risks, alternatives and complications were also discussed. The patient understands and wishes to proceed with the procedure. Written consent was obtained. Ultrasound was performed to localize and mark an adequate pocket of fluid in the right chest. The area was then prepped and draped in the normal sterile fashion. 1% Lidocaine was used for local anesthesia. Under ultrasound guidance a 6 Fr Safe-T-Centesis catheter was introduced. Thoracentesis was performed. The catheter was removed and a dressing applied. FINDINGS: A total of approximately 700 mL of clear yellow fluid was removed. IMPRESSION: Successful ultrasound guided right thoracentesis yielding 700 mL of pleural fluid. Performed by Lynnette Caffey, PA-C Electronically Signed   By: Richarda Overlie M.D.   On: 09/11/2023 12:05   US Abdomen Limited RUQ (LIVER/GB)  Result Date: 09/10/2023 CLINICAL DATA:  Transaminitis EXAM: ULTRASOUND ABDOMEN LIMITED RIGHT UPPER QUADRANT COMPARISON:  None Available. FINDINGS: Gallbladder: Gallstones. No gallbladder wall thickening. No sonographic Murphy sign noted by sonographer. Common bile duct: Diameter: 0.57 Liver: No focal lesion identified. Mildly coarse contour of the liver. Increased parenchymal echogenicity. Portal vein is patent on color Doppler imaging with normal  direction of blood flow towards the liver. Other: Small volume ascites.  Small right pleural IMPRESSION: 1. Increased hepatic parenchymal echogenicity and mildly coarse contour of the liver, consistent hepatic steatosis and suspicious for cirrhosis. 2. Small volume ascites. 3. Small right pleural effusion. 4. Cholelithiasis without evidence of acute cholecystitis. Electronically Signed   By: Jearld Lesch M.D.   On: 09/10/2023 11:46   ECHOCARDIOGRAM COMPLETE  Result Date: 09/10/2023    ECHOCARDIOGRAM REPORT   Patient Name:   MASAO JUNKER Date of Exam: 09/10/2023 Medical Rec #:  [id number]   Height:       70.0 in Accession #:    [id number]  Weight:       168.0 lb Date of Birth:  1930/07/20   BSA:  LDLCALC", "TRIG", "CHOLHDL", "LDLDIRECT" in the last 72 hours. Thyroid Function Tests: Recent Labs    09/10/23 0412  TSH 4.006   Anemia Panel: No results for input(s): "VITAMINB12", "FOLATE", "FERRITIN", "TIBC", "IRON", "RETICCTPCT" in the last 72 hours. Sepsis Labs: No results for input(s): "PROCALCITON", "LATICACIDVEN" in the last 168 hours.  No results found for this or any previous visit (from the past 240 hour(s)).       Radiology Studies: DG Chest 1 View  Result Date: 09/11/2023 CLINICAL DATA:  Status post right thoracentesis. EXAM: CHEST  1 VIEW COMPARISON:  09/09/2023 FINDINGS: Slightly improved aeration at the right lung base compatible with recent thoracentesis. Negative for pneumothorax. Right arm PICC line. PICC line tip is difficult evaluate but appears to be near the cavoatrial junction. Stable appearance of the left cardiac device. Heart size is stable. Prominent vascular markings with hazy lung densities bilaterally. Findings could be related to vascular congestion or pulmonary edema. Persistent hazy densities at the left lung base. IMPRESSION: 1. Slightly improved  aeration at the right lung base. Negative for pneumothorax. 2. Persistent hazy lung densities bilaterally. Findings could be related to vascular congestion or pulmonary edema. Electronically Signed   By: Richarda Overlie M.D.   On: 09/11/2023 12:28   US THORACENTESIS ASP PLEURAL SPACE W/IMG GUIDE  Result Date: 09/11/2023 INDICATION: Patient with history of CHF, new onset pleural effusion. Request for therapeutic right thoracentesis. EXAM: ULTRASOUND GUIDED RIGHT THORACENTESIS MEDICATIONS: 6 mL 1% lidocaine COMPLICATIONS: None immediate. PROCEDURE: An ultrasound guided thoracentesis was thoroughly discussed with the patient and questions answered. The benefits, risks, alternatives and complications were also discussed. The patient understands and wishes to proceed with the procedure. Written consent was obtained. Ultrasound was performed to localize and mark an adequate pocket of fluid in the right chest. The area was then prepped and draped in the normal sterile fashion. 1% Lidocaine was used for local anesthesia. Under ultrasound guidance a 6 Fr Safe-T-Centesis catheter was introduced. Thoracentesis was performed. The catheter was removed and a dressing applied. FINDINGS: A total of approximately 700 mL of clear yellow fluid was removed. IMPRESSION: Successful ultrasound guided right thoracentesis yielding 700 mL of pleural fluid. Performed by Lynnette Caffey, PA-C Electronically Signed   By: Richarda Overlie M.D.   On: 09/11/2023 12:05   US Abdomen Limited RUQ (LIVER/GB)  Result Date: 09/10/2023 CLINICAL DATA:  Transaminitis EXAM: ULTRASOUND ABDOMEN LIMITED RIGHT UPPER QUADRANT COMPARISON:  None Available. FINDINGS: Gallbladder: Gallstones. No gallbladder wall thickening. No sonographic Murphy sign noted by sonographer. Common bile duct: Diameter: 0.57 Liver: No focal lesion identified. Mildly coarse contour of the liver. Increased parenchymal echogenicity. Portal vein is patent on color Doppler imaging with normal  direction of blood flow towards the liver. Other: Small volume ascites.  Small right pleural IMPRESSION: 1. Increased hepatic parenchymal echogenicity and mildly coarse contour of the liver, consistent hepatic steatosis and suspicious for cirrhosis. 2. Small volume ascites. 3. Small right pleural effusion. 4. Cholelithiasis without evidence of acute cholecystitis. Electronically Signed   By: Jearld Lesch M.D.   On: 09/10/2023 11:46   ECHOCARDIOGRAM COMPLETE  Result Date: 09/10/2023    ECHOCARDIOGRAM REPORT   Patient Name:   MASAO JUNKER Date of Exam: 09/10/2023 Medical Rec #:  [id number]   Height:       70.0 in Accession #:    [id number]  Weight:       168.0 lb Date of Birth:  1930/07/20   BSA:  PROGRESS NOTE    Kevin Collins  OFB:[id number] DOB: 1930-09-26 DOA: 09/09/2023 PCP: Toma Deiters, MD   Brief Narrative:    Kevin Collins is a 89 y.o. male with medical history significant of coronary artery disease, hyperlipidemia, hypertension, peripheral neuropathy, recent admission for bacteremia, pacemaker in place, and more presents the ED with a chief complaint of orthopnea.  Patient reports that he wakes up in the middle of the night feeling dizzy and like he might pass out and very short of breath.  He reports that this feeling can last throughout the day until 430 or 5 PM.  He reports it has been going on since his Lasix was discontinued at his last hospitalization.  Patient has been admitted with acute HFrEF.  Assessment & Plan:   Principal Problem:   CHF (congestive heart failure) (HCC) Active Problems:   Dyslipidemia   Paroxysmal atrial fibrillation (HCC)   Essential hypertension   Peripheral neuropathy   Transaminitis   Bacteremia   AKI (acute kidney injury) (HCC)  Assessment and Plan:  Acute HFrEF - Appreciate cardiology recommendations with spironolactone 12.5 mg daily added today -2D echocardiogram with EF 35-40% noted - Continue 60 mg IV Lasix twice daily - Monitor intake and output - Fluid restrictions - Continue to monitor -Right-sided thoracentesis performed today with 700 mL fluid output   Paroxysmal atrial fibrillation (HCC) - Continue Eliquis   Dyslipidemia - Holding Zocor in the setting of transaminitis   AKI (acute kidney injury) (HCC)-improving - Likely related to cardiorenal syndrome - Hold nephrotoxic agents when possible - Continue diuresis - Trend in the a.m.  Hypokalemia/Hypomagnesemia -Replete and reevaluate in a.m.   Bacteremia - At last hospitalization was diagnosed with Enterococcus faecalis bacteremia - Was discharged on 6 weeks of ampicillin and Rocephin - Continue ampicillin and Rocephin - Given that he did have bacteremia  recently and now that he is in acute heart failure, will repeat echo even though 1 was just done earlier this month to evaluate valves, signs for endocarditis - Continue to monitor -Will require treatment through 10/17   Transaminitis-improving - Upward trend noted - At this time most likely related to vascular congestion - Right upper quadrant ultrasound with findings of cirrhosis and will need outpatient GI evaluation - Hepatitis panel negative - Hold Zocor - Trend in the a.m.   Peripheral neuropathy - Continue gabapentin at a reduced dose given AKI   Essential hypertension - Currently controlled, continue to monitor    DVT prophylaxis:apixaban Code Status: DNR Family Communication: None at bedside Disposition Plan:  Status is: Inpatient Remains inpatient appropriate because: Continues to require IV diuresis   Consultants:  Cardiology  Procedures:  Right thoracentesis 9/27 with 700 mL fluid output  Antimicrobials:  Anti-infectives (From admission, onward)    Start     Dose/Rate Route Frequency Ordered Stop   09/10/23 0000  ampicillin (OMNIPEN) 2 g in sodium chloride 0.9 % 100 mL IVPB        2 g 300 mL/hr over 20 Minutes Intravenous Every 6 hours 09/09/23 2319     09/09/23 2315  ampicillin  Status:  Discontinued       Note to Pharmacy: As a continuous infusion Indication:  Endocarditis First Dose: Yes Last Day of Therapy:  09/30/2023 Labs - Once weekly:  CBC/D and BMP, Labs - Once weekly: ESR and CRP   8 g Intravenous Every 24 hours 09/09/23 2216 09/09/23 2319   09/09/23 2315  cefTRIAXone (ROCEPHIN) IVPB  Status:  Discontinued

## 2023-09-11 NOTE — Care Management Important Message (Signed)
Important Message  Patient Details  Name: Kevin Collins MRN: [id number] Date of Birth: May 27, 1930   Important Message Given:  Yes - Medicare IM     Corey Harold 09/11/2023, 11:20 AM

## 2023-09-11 NOTE — Progress Notes (Signed)
Patient restless throughout the night, not able to get good sleep. Patient given melatonin but not affective. Patient had no complaints of pain.

## 2023-09-11 NOTE — Progress Notes (Signed)
PHARMACY CONSULT NOTE FOR:  OUTPATIENT  PARENTERAL ANTIBIOTIC THERAPY (OPAT)  Indication: E faecalis IE Regimen: Ampicillin 8g IV daily as a continuous infusion and Rocephin 2g IV every 12 hours End date: 09/30/23  IV antibiotic discharge orders are pended. To discharging provider:  please sign these orders via discharge navigator,  Select New Orders & click on the button choice - Manage This Unsigned Work.     Thank you for allowing pharmacy to be a part of this patient's care.  Georgina Pillion, PharmD, BCPS, BCIDP Infectious Diseases Clinical Pharmacist 09/11/2023 10:59 AM   **Pharmacist phone directory can now be found on [email or web address] ([email or web address]).  Listed under [facility].

## 2023-09-11 NOTE — Progress Notes (Incomplete)
Attending note  Patient seen and discussed with PA Iran Ouch, I agree with her documentation. 89 yo male history of CAD with prior CABG 10/1999 LIMA-LAD, SVG-OM, SVG-diagonal, SVG-PDA/PL, afib, HTN, bradycardia, chronic HFrEF, CRT-P BiV, admitted with SOB and weakness   K 4.4 Cr 1.47 BUN 14 WBC 10.3 Plt 200 Hgb 10.7 BNP 932 Trop 30-->53 CXR: +pulm edema, small bilateral effusions R>L.  EKG vpaced 08/2023 echo: LVE 35-40%, grade III dd, RV pressure overload, mild RV dysfunction, severe BAE, mod MR    1.Acute on chronic HFrEF - Jan 2023 echo LVEF 40-45% - 08/20/23 echo LVEF 50-55% - 09/10/23 echo LVEF 35-40%  - recurrent drop in LVEF, in setting of bacteremia. Perhaps a stress induced CM - advanced age, active infection, patient preference would not plan for invasive procedures. Treat medically - volume overloaded. Negative 3.7 L yesterday and 4.7 L since admission, he is on IV lasix 60mg  bid. Renal function is stable. Remains significantly volume overloaded by exam, continue IV diuresis  - renal dysfunction and based on notes at least some prior issues with low bps in the past.  - start toprol 25mg  daily when more euvolemic. GFR 44 and SBP 132, will try adding losartan 12.5mg  daily. With active bacterial infection would be reluctant to start SGLT2i.   2.CAD - no acute issues   3. Afib - continue current meds including eliquis for stroke prevention.   Dina Rich MD

## 2023-09-11 NOTE — Consult Note (Addendum)
Cardiology Consultation   Patient ID: Kevin Collins MRN: [id number]; DOB: [birth year]  Admit date: 09/09/2023 Date of Consult: 09/11/2023  PCP:  Toma Deiters, MD   [facility] Providers Cardiologist:  Rollene Rotunda, MD  Electrophysiologist:  Lanier Prude, MD       Patient Profile:   Kevin Collins is a 89 y.o. male with a hx of CAD (s/p CABG in 10/1999 with LIMA-LAD, SVG-OM, SVG-D1 and SVG-PDA/PL), persistent atrial fibrillation, HTN, HLD, Stage 3 CKD, chronic HFmrEF (EF 40-45% in 2020 and 12/2021, at 50-55% by echo in 08/2023) and symptomatic bradycardia (s/p CRT-P BiV in 01/2022) who is being seen 09/11/2023 for the evaluation of worsening cardiomyopathy at the request of Dr. Sherryll Burger.  History of Present Illness:   Mr. Amarante was recently admitted to [facility] from 9/3 - 08/22/2023 for Enterococcus faecalis bacteremia which was felt to possibly be due to his left foot cellulitis/possible osteomyelitis. A transthoracic echocardiogram was performed that showed his EF had slightly improved to 50 to 55% with no regional wall motion abnormalities. He did have mild MR but no evidence of vegetations. EP was consulted to discuss TEE and possible device removal but given his advanced age and given that he would not be a candidate for CT surgical backup, it was recommended to treat with antibiotics alone given his advanced age and comorbidities. The patient and his family were in agreement with conservative approach to therapy.  He presented back to the ED on 09/02/2023 for evaluation of worsening lower extremity edema but decided to leave AMA. Presented back to the ED on 09/09/2023 for similar symptoms. Reported poor oral intake over the past several days with associated nausea along with intermittent diarrhea. Also reported worsening dyspnea on exertion, orthopnea and lower extremity edema.   Initial labs showed WBC 10.3, Hgb 10.7, platelets 200, Na+ 137, K+ 4.4 and creatinine 1.47  (previously variable from 1.1 - 1.9 during admission earlier in the month). AST 311 and ALT 205. BNP elevated to 932. ESR and CRP normal. Initial and repeat Hs Troponin values mildly elevated at 30 and 53. EKG showing V-paced rhythm, HR 68.   He received IV Lasix 40 mg while in the ED and has been started on 60 mg twice daily. Repeat labs today show that K+ is low at 3.4 and creatinine remains stable at 1.48. LFT's were elevated yesterday with AST at 559 and ALT at 761 but these have improved to 264 and 280 respectively today.  In talking the patient today, he reports he is very tired from being in the hospital and wishes to go home as soon as possible  Says that he received significant amounts of fluid during his last admission and Lasix was stopped at the time of discharge but he did resume this several days later at the instruction of his PCP but he feels like it was too late at that time as he had already retained a significant amount of fluid. Reports lower extremity edema and abdominal distention.  Also notes orthopnea, PND and dyspnea on exertion.  He reports frustration with his PICC line as he was initially informed home health would help with this but his sons have been managing the PICC.    Past Medical History:  Diagnosis Date   Bradycardia, unspecified    CAD (coronary artery disease)    NATIVE WITHOUT ANGINA   Hereditary and idiopathic neuropathy, unspecified    History of shingles    Hyperlipidemia    Hypertension  ESSENTIAL PRIMARY   Peripheral neuropathy     Past Surgical History:  Procedure Laterality Date   BIV PACEMAKER INSERTION CRT-P N/A 01/24/2022   Procedure: BIV PACEMAKER INSERTION CRT-P;  Surgeon: Lanier Prude, MD;  Location: [facility] INVASIVE CV LAB;  Service: Cardiovascular;  Laterality: N/A;   CARDIAC CATHETERIZATION Left 09/1999   NORMAL LEFT MAIN, OCCLUDED LAD, OCCLUDED MID CFX, OCCLUDED PROXIMAL RCA, 60% STENOSIS PROXIMAL DIAG 1, RIGHT TO LEFT COLLATERAL,  LEFT TO RIGHT COLLATERAL; LVEF OF 48% DOCUMENTED VIA NUCLEAR STUDY ON 07/12/2009.   CARDIOVERSION N/A 10/05/2019   Procedure: CARDIOVERSION;  Surgeon: Thurmon Fair, MD;  Location: [facility] ENDOSCOPY;  Service: Cardiovascular;  Laterality: N/A;   CATARACT EXTRACTION     CORONARY ARTERY BYPASS GRAFT     w/ LIMA TO LAD, SVG TO dx, SVG TO OM, SVG TO AM-PD-PL 10/12/99 [facility]   REMOVAL OF ANKLE PLATE       Home Medications:  Prior to Admission medications   Medication Sig Start Date End Date Taking? Authorizing Provider  ampicillin IVPB Inject 8 g into the vein daily. As a continuous infusion Indication:  Endocarditis First Dose: Yes Last Day of Therapy:  09/30/2023 Labs - Once weekly:  CBC/D and BMP, Labs - Once weekly: ESR and CRP Method of administration: Ambulatory Pump (Continuous Infusion) Method of administration may be changed at the discretion of home infusion pharmacist based upon assessment of the patient and/or caregiver's ability to self-administer the medication ordered. 08/22/23 10/01/23 Yes Adhikari, Amrit, MD  bimatoprost (LUMIGAN) 0.01 % SOLN Place 1 drop into both eyes at bedtime.   Yes [provider]  cefTRIAXone (ROCEPHIN) IVPB Inject 2 g into the vein every 12 (twelve) hours. Indication:  Endocarditis First Dose: Yes Last Day of Therapy:  09/30/2023 Labs - Once weekly:  CBC/D and BMP, Labs - Once weekly: ESR and CRP Method of administration: IV Push Method of administration may be changed at the discretion of home infusion pharmacist based upon assessment of the patient and/or caregiver's ability to self-administer the medication ordered. 08/22/23 10/01/23 Yes Burnadette Pop, MD  cinacalcet (SENSIPAR) 90 MG tablet Take 90 mg by mouth daily. 10/31/20  Yes [provider]  ELIQUIS 2.5 MG TABS tablet TAKE 1 TABLET TWICE A DAY 12/02/22  Yes Hochrein, Fayrene Fearing, MD  ezetimibe (ZETIA) 10 MG tablet TAKE 1 TABLET DAILY (NEED APPOINTMENT FOR FUTURE REFILLS) 08/10/23   Yes Rollene Rotunda, MD  gabapentin (NEURONTIN) 800 MG tablet Take 1 tablet (800 mg total) by mouth daily. Patient taking differently: Take 800 mg by mouth 3 (three) times daily. 08/22/23  Yes Adhikari, Willia Craze, MD  KRILL OIL PO Take 1 capsule by mouth daily.   Yes [provider]  loperamide (IMODIUM) 2 MG capsule Take 1 capsule (2 mg total) by mouth every 6 (six) hours as needed for diarrhea or loose stools. 08/22/23  Yes Burnadette Pop, MD  Multiple Vitamins-Minerals (PRESERVISION AREDS) CAPS Take 1 capsule by mouth 2 (two) times daily.   Yes [provider]  oxyCODONE-acetaminophen (PERCOCET) 7.5-325 MG tablet Take 1 tablet by mouth every 8 (eight) hours as needed for severe pain.   Yes [provider]  potassium chloride SA (KLOR-CON M) 20 MEQ tablet Take 1 tablet (20 mEq total) by mouth 2 (two) times daily. 09/02/23  Yes Derwood Kaplan, MD  simvastatin (ZOCOR) 80 MG tablet TAKE 1 TABLET DAILY 08/11/23  Yes Rollene Rotunda, MD    Inpatient Medications: Scheduled Meds:  apixaban  2.5 mg Oral BID  Chlorhexidine Gluconate Cloth  6 each Topical Q0600   cinacalcet  90 mg Oral Q breakfast   ezetimibe  10 mg Oral Daily   furosemide  60 mg Intravenous BID   gabapentin  400 mg Oral Daily   melatonin  6 mg Oral QHS   potassium chloride  40 mEq Oral BID   Continuous Infusions:  sodium chloride Stopped (09/11/23 0643)   ampicillin (OMNIPEN) IV Stopped (09/11/23 1096)   cefTRIAXone (ROCEPHIN)  IV Stopped (09/10/23 2347)   PRN Meds: sodium chloride, ALPRAZolam, ondansetron **OR** ondansetron (ZOFRAN) IV, oxyCODONE, traMADol  Allergies:   No Known Allergies  Social History:   Social History   Socioeconomic History   Marital status: Divorced    Spouse name: Not on file   Number of children: 1   Years of education: Not on file   Highest education level: Not on file  Occupational History   Occupation: RETIRED  Tobacco Use   Smoking status: Former   Smokeless  tobacco: Never  Advertising account planner   Vaping status: Never Used  Substance and Sexual Activity   Alcohol use: Never   Drug use: Never   Sexual activity: Not on file  Other Topics Concern   Not on file  Social History Narrative   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     Social Determinants of Health   Financial Resource Strain: Not on file  Food Insecurity: No Food Insecurity (08/18/2023)   Hunger Vital Sign    Worried About Running Out of Food in the Last Year: Never true    Ran Out of Food in the Last Year: Never true  Transportation Needs: No Transportation Needs (08/18/2023)   PRAPARE - Administrator, Civil Service (Medical): No    Lack of Transportation (Non-Medical): No  Physical Activity: Not on file  Stress: Not on file  Social Connections: Not on file  Intimate Partner Violence: Not At Risk (08/18/2023)   Humiliation, Afraid, Rape, and Kick questionnaire    Fear of Current or Ex-Partner: No    Emotionally Abused: No    Physically Abused: No    Sexually Abused: No    Family History:    Family History  Problem Relation Age of Onset   Other Mother        MALIGNANT TUMOR OF OVARY   CVA Father      ROS:  Please see the history of present illness.   All other ROS reviewed and negative.     Physical Exam/Data:   Vitals:   09/10/23 0801 09/10/23 1331 09/10/23 1944 09/11/23 0439  BP: (!) 144/76 106/86 137/66 132/88  Pulse: 75 74 67 71  Resp: (!) 23 20 20 20   Temp:  98.2 F (36.8 C) 97.8 F (36.6 C) 98 F (36.7 C)  TempSrc:   Oral Oral  SpO2: 97% 96% 97% 97%  Weight:    75.4 kg  Height:        Intake/Output Summary (Last 24 hours) at 09/11/2023 1119 Last data filed at 09/11/2023 0800 Gross per 24 hour  Intake 440 ml  Output 3700 ml  Net -3260 ml      09/11/2023    4:39 AM 09/10/2023    3:49 AM 09/09/2023   10:12 PM  Last 3 Weights  Weight (lbs) 166 lb 3.6 oz 167 lb 15.9 oz 167 lb 15.9 oz  Weight (kg) 75.4 kg 76.2 kg 76.2 kg     Body  mass index  is 23.85 kg/m.  General: Pleasant, elderly male appearing in no acute distress HEENT: normal Neck: no JVD Vascular: No carotid bruits; Distal pulses 2+ bilaterally Cardiac:  normal S1, S2; Irregularly irregular. Lungs: Rales along bases bilaterally.  Abd: soft, nontender, no hepatomegaly  Ext: no edema Musculoskeletal:  No deformities, BUE and BLE strength normal and equal Skin: warm and dry  Neuro:  CNs 2-12 intact, no focal abnormalities noted Psych:  Normal affect   EKG:  The EKG was personally reviewed and demonstrates: V-paced rhythm, HR 68.  Telemetry:  Telemetry was personally reviewed and demonstrates: Rate controlled atrial fibrillation with intermittent V pacing.  Relevant CV Studies:  Echocardiogram: 08/20/2023 IMPRESSIONS     1. Left ventricular ejection fraction, by estimation, is 50 to 55%. The  left ventricle has low normal function. The left ventricle has no regional  wall motion abnormalities. The left ventricular internal cavity size was  mildly dilated. Left ventricular  diastolic parameters are indeterminate.   2. Right ventricular systolic function is normal. The right ventricular  size is mildly enlarged. There is moderately elevated pulmonary artery  systolic pressure.   3. Left atrial size was severely dilated.   4. Right atrial size was severely dilated.   5. The mitral valve is normal in structure. Mild mitral valve  regurgitation. No evidence of mitral stenosis.   6. The aortic valve is tricuspid. Aortic valve regurgitation is mild. No  aortic stenosis is present.   7. The inferior vena cava is dilated in size with <50% respiratory  variability, suggesting right atrial pressure of 15 mmHg.    Echocardiogram: 09/10/2023 IMPRESSIONS     1. Left ventricular ejection fraction, by estimation, is 35 to 40%. The  left ventricle has moderately decreased function. The left ventricle  demonstrates global hypokinesis. Left ventricular diastolic  parameters are  consistent with Grade III diastolic  dysfunction (restrictive). Elevated left atrial pressure. There is the  interventricular septum is flattened in systole, consistent with right  ventricular pressure overload.   2. Right ventricular systolic function is mildly reduced. The right  ventricular size is mild to moderately enlarged. There is moderately  elevated pulmonary artery systolic pressure.   3. Left atrial size was severely dilated.   4. Right atrial size was severely dilated.   5. The mitral valve is abnormal. Moderate mitral valve regurgitation. No  evidence of mitral stenosis.   6. The tricuspid valve is abnormal. Tricuspid valve regurgitation is mild  to moderate.   7. The aortic valve is tricuspid. Aortic valve regurgitation is mild. No  aortic stenosis is present.   8. The inferior vena cava is dilated in size with <50% respiratory  variability, suggesting right atrial pressure of 15 mmHg.    Laboratory Data:  High Sensitivity Troponin:   Recent Labs  Lab 09/09/23 1849 09/10/23 0412  TROPONINIHS 30* 53*     Chemistry Recent Labs  Lab 09/09/23 1849 09/10/23 0412 09/11/23 0416  NA 137 138 138  K 4.4 3.8 3.4*  CL 97* 97* 94*  CO2 25 25 28   GLUCOSE 107* 93 95  BUN 14 16 19   CREATININE 1.47* 1.37* 1.48*  CALCIUM 7.8* 7.8* 8.0*  MG  --  1.3* 1.6*  GFRNONAA 45* 49* 44*  ANIONGAP 15 16* 16*    Recent Labs  Lab 09/09/23 1849 09/10/23 0412 09/11/23 0416  PROT 6.3* 6.1* 5.8*  ALBUMIN 3.5 3.3* 3.2*  AST 311* 559* 264*  ALT 205* 761* 280*  ALKPHOS 53 51  51  BILITOT 1.0 0.7 0.8   Lipids No results for input(s): "CHOL", "TRIG", "HDL", "LABVLDL", "LDLCALC", "CHOLHDL" in the last 168 hours.  Hematology Recent Labs  Lab 09/09/23 1849 09/10/23 0412 09/11/23 0416  WBC 10.3 10.1 10.6*  RBC 3.49* 3.51* 3.55*  HGB 10.7* 10.4* 10.7*  HCT 34.5* 34.7* 34.8*  MCV 98.9 98.9 98.0  MCH 30.7 29.6 30.1  MCHC 31.0 30.0 30.7  RDW 15.1 15.0 14.9  PLT  200 173 166   Thyroid  Recent Labs  Lab 09/10/23 0412  TSH 4.006    BNP Recent Labs  Lab 09/09/23 1849  BNP 932.0*    DDimer No results for input(s): "DDIMER" in the last 168 hours.   Radiology/Studies:  US Abdomen Limited RUQ (LIVER/GB)  Result Date: 09/10/2023 CLINICAL DATA:  Transaminitis EXAM: ULTRASOUND ABDOMEN LIMITED RIGHT UPPER QUADRANT COMPARISON:  None Available. FINDINGS: Gallbladder: Gallstones. No gallbladder wall thickening. No sonographic Murphy sign noted by sonographer. Common bile duct: Diameter: 0.57 Liver: No focal lesion identified. Mildly coarse contour of the liver. Increased parenchymal echogenicity. Portal vein is patent on color Doppler imaging with normal direction of blood flow towards the liver. Other: Small volume ascites.  Small right pleural IMPRESSION: 1. Increased hepatic parenchymal echogenicity and mildly coarse contour of the liver, consistent hepatic steatosis and suspicious for cirrhosis. 2. Small volume ascites. 3. Small right pleural effusion. 4. Cholelithiasis without evidence of acute cholecystitis. Electronically Signed   By: Jearld Lesch M.D.   On: 09/10/2023 11:46    DG Chest Port 1 View  Result Date: 09/09/2023 CLINICAL DATA:  Weakness EXAM: PORTABLE CHEST 1 VIEW COMPARISON:  Chest x-ray 09/02/2023 FINDINGS: Right upper extremity PICC terminates in the SVC. Left-sided ICD and sternotomy wires are again seen. The heart is enlarged. There central pulmonary vascular congestion. There central interstitial opacities as well likely related to edema. There are small bilateral pleural effusions, right greater than left. There are patchy opacities in the left lung base. There is no pneumothorax. IMPRESSION: 1. Cardiomegaly with central pulmonary vascular congestion and central interstitial opacities likely related to edema. 2. Small bilateral pleural effusions, right greater than left. 3. Patchy opacities in the left lung base, likely atelectasis.  Electronically Signed   By: Darliss Cheney M.D.   On: 09/09/2023 19:51     Assessment and Plan:   1. Acute HFrEF - Echo this admission shows his EF is at 35 to 40% which is overall similar to prior imaging in 2020 and 12/2021 but his EF had improved by repeat imaging earlier this month. It is possible this has further declined given his other multiple medical issues at this time. Could also be at risk for worsening ischemic cardiomyopathy given his history of CABG. He is very clear in his decision that he would not wish to pursue aggressive workup given his age. We also discussed Palliative Care again as this was discussed during his last admission and this is something he would be interested in. - At this time, would focus on medical therapy. He is currently receiving IV Lasix 60 mg twice daily (net output of -4.5 L) and was taking Lasix 20 mg daily at home. Would at least require 40 mg daily at the time of discharge. Would avoid an SGLT2 inhibitor given his current foot infection. Can consider the use of a beta-blocker now that he has a PPM or low-dose Losartan or Spironolactone. Doubt BP would allow for Entresto given his SBP in the low 100's at times.  2. CAD - He is s/p CABG in 10/1999 with LIMA-LAD, SVG-OM, SVG-D1 and SVG-PDA/PL. He reports dyspnea on exertion but denies any recent chest pain. Enzymes have been flat at 30 and 53 which is not consistent with ACS. - He is not on ASA given the need for anticoagulation and statin therapy is currently held given elevated LFT's. He does remain on Zetia 10 mg daily.  3. Persistent Atrial Fibrillation - Rates are well-controlled this admission. He was previously not on AV nodal blocking agents given bradycardia but now has a PPM in place. Could consider the addition of this given his cardiomyopathy as outlined above. - No reports of active bleeding. He remains on Eliquis 2.5 mg twice daily for anticoagulation and may require dose adjustment depending on  his renal function as he is 89 yo.   4. Symptomatic Bradycardia - He is s/p CRT-P BiV in 01/2022. Followed by Dr. Lalla Brothers as an outpatient.   5. Stage 3 CKD - Previously variable from 1.1 - 1.9 during admission earlier in the month and at 1.47 this admission. Stable at 1.48 today.   6. Elevated LFT's - Likely due to hepatic congestion. AST 559 and ALT 761 yesterday, improved to 264 and 280 today.    Risk Assessment/Risk Scores:    New York Heart Association (NYHA) Functional Class NYHA Class IV   CHA2DS2-VASc Score = 5   This indicates a 7.2% annual risk of stroke. The patient's score is based upon: CHF History: 1 HTN History: 1 Diabetes History: 0 Stroke History: 0 Vascular Disease History: 1 Age Score: 2 Gender Score: 0     For questions or updates, please contact [facility] Please consult [email or web address] for contact info under    Signed, Ellsworth Lennox, PA-C  09/11/2023 11:19 AM  Attending note   Patient seen and discussed with PA Iran Ouch, I agree with her documentation. 89 yo male history of CAD with prior CABG 10/1999 LIMA-LAD, SVG-OM, SVG-diagonal, SVG-PDA/PL, afib, HTN, bradycardia, chronic HFrEF, CRT-P BiV, admitted with SOB and weakness     K 4.4 Cr 1.47 BUN 14 WBC 10.3 Plt 200 Hgb 10.7 BNP 932 Trop 30-->53 CXR: +pulm edema, small bilateral effusions R>L.  EKG vpaced 08/2023 echo: LVE 35-40%, grade III dd, RV pressure overload, mild RV dysfunction, severe BAE, mod MR       1.Acute on chronic HFrEF - Jan 2023 echo LVEF 40-45% - 08/20/23 echo LVEF 50-55% - 09/10/23 echo LVEF 35-40%   - recurrent drop in LVEF, in setting of bacteremia. Perhaps a stress induced CM - advanced age, active infection, patient preference would not plan for invasive procedures. Treat medically - volume overloaded. Negative 3.7 L yesterday and 4.7 L since admission, he is on IV lasix 60mg  bid. Renal function is stable. Remains significantly volume overloaded by exam,  continue IV diuresis   - renal dysfunction and based on notes at least some prior issues with low bps in the past.  - start toprol 25mg  daily when more euvolemic. GFR 44 and SBP 132, will try adding losartan 12.5mg  daily. With active bacterial infection would be reluctant to start SGLT2i. Start aldactone 12.5mg  daily   2.CAD - no acute issues     3. Afib - continue current meds including eliquis for stroke prevention.  - if Cr remains <1.5 would need to change eliquis to 5mg  bid.    Dina Rich MD

## 2023-09-11 NOTE — Procedures (Signed)
PROCEDURE SUMMARY:  Successful image-guided right thoracentesis. Yielded 700 milliliters of clear yellow fluid. Patient tolerated procedure well. EBL < 1 mL No immediate complications.  No labs requested by ordering provider.  Post procedure CXR shows no pneumothorax.  Please see imaging section of Epic for full dictation.  Villa Herb PA-C 09/11/2023 11:54 AM

## 2023-09-12 DIAGNOSIS — I5023 Acute on chronic systolic (congestive) heart failure: Secondary | ICD-10-CM | POA: Diagnosis not present

## 2023-09-12 LAB — CBC
HCT: 33.4 % — ABNORMAL LOW (ref 39.0–52.0)
Hemoglobin: 10.1 g/dL — ABNORMAL LOW (ref 13.0–17.0)
MCH: 29.6 pg (ref 26.0–34.0)
MCHC: 30.2 g/dL (ref 30.0–36.0)
MCV: 97.9 fL (ref 80.0–100.0)
Platelets: 158 10*3/uL (ref 150–400)
RBC: 3.41 MIL/uL — ABNORMAL LOW (ref 4.22–5.81)
RDW: 15.2 % (ref 11.5–15.5)
WBC: 9.8 10*3/uL (ref 4.0–10.5)
nRBC: 0 % (ref 0.0–0.2)

## 2023-09-12 LAB — MAGNESIUM: Magnesium: 1.6 mg/dL — ABNORMAL LOW (ref 1.7–2.4)

## 2023-09-12 LAB — COMPREHENSIVE METABOLIC PANEL
ALT: 228 U/L — ABNORMAL HIGH (ref 0–44)
AST: 161 U/L — ABNORMAL HIGH (ref 15–41)
Albumin: 3.2 g/dL — ABNORMAL LOW (ref 3.5–5.0)
Alkaline Phosphatase: 50 U/L (ref 38–126)
Anion gap: 15 (ref 5–15)
BUN: 21 mg/dL (ref 8–23)
CO2: 30 mmol/L (ref 22–32)
Calcium: 7.9 mg/dL — ABNORMAL LOW (ref 8.9–10.3)
Chloride: 92 mmol/L — ABNORMAL LOW (ref 98–111)
Creatinine, Ser: 1.39 mg/dL — ABNORMAL HIGH (ref 0.61–1.24)
GFR, Estimated: 48 mL/min — ABNORMAL LOW (ref >60)
Glucose, Bld: 89 mg/dL (ref 70–99)
Potassium: 3.3 mmol/L — ABNORMAL LOW (ref 3.5–5.1)
Sodium: 137 mmol/L (ref 135–145)
Total Bilirubin: 0.6 mg/dL (ref 0.3–1.2)
Total Protein: 5.7 g/dL — ABNORMAL LOW (ref 6.5–8.1)

## 2023-09-12 MED ORDER — POTASSIUM CHLORIDE CRYS ER 20 MEQ PO TBCR
40.0000 meq | EXTENDED_RELEASE_TABLET | Freq: Two times a day (BID) | ORAL | Status: AC
  Administered 2023-09-12 (×2): 40 meq via ORAL
  Filled 2023-09-12 (×2): qty 2

## 2023-09-12 MED ORDER — MAGNESIUM SULFATE 2 GM/50ML IV SOLN
2.0000 g | Freq: Once | INTRAVENOUS | Status: AC
  Administered 2023-09-12: 2 g via INTRAVENOUS
  Filled 2023-09-12: qty 50

## 2023-09-12 NOTE — Progress Notes (Signed)
PROGRESS NOTE    Kevin Collins  QVZ:[id number] DOB: 1930-06-03 DOA: 09/09/2023 PCP: Toma Deiters, MD   Brief Narrative:    Kevin Collins is a 89 y.o. male with medical history significant of coronary artery disease, hyperlipidemia, hypertension, peripheral neuropathy, recent admission for bacteremia, pacemaker in place, and more presents the ED with a chief complaint of orthopnea.  Patient reports that he wakes up in the middle of the night feeling dizzy and like he might pass out and very short of breath.  He reports that this feeling can last throughout the day until 430 or 5 PM.  He reports it has been going on since his Lasix was discontinued at his last hospitalization.  Patient has been admitted with acute HFrEF and continues to have slow improvement with ongoing diuresis.  Assessment & Plan:   Principal Problem:   CHF (congestive heart failure) (HCC) Active Problems:   Dyslipidemia   Paroxysmal atrial fibrillation (HCC)   Essential hypertension   Peripheral neuropathy   Transaminitis   Bacteremia   AKI (acute kidney injury) (HCC)  Assessment and Plan:  Acute HFrEF - Appreciate cardiology recommendations with spironolactone 12.5 mg daily added today -2D echocardiogram with EF 35-40% noted - Continue 60 mg IV Lasix twice daily - Monitor intake and output - Fluid restrictions - Continue to monitor -Right-sided thoracentesis performed 9/27 with 700 mL fluid output   Paroxysmal atrial fibrillation (HCC) - Continue Eliquis   Dyslipidemia - Holding Zocor in the setting of transaminitis which is improving   AKI (acute kidney injury) (HCC)-improving - Likely related to cardiorenal syndrome - Hold nephrotoxic agents when possible - Continue diuresis - Trend in the a.m.  Hypokalemia/Hypomagnesemia -Replete and reevaluate in a.m.   Bacteremia - At last hospitalization was diagnosed with Enterococcus faecalis bacteremia - Was discharged on 6 weeks of ampicillin and  Rocephin - Continue ampicillin and Rocephin - Given that he did have bacteremia recently and now that he is in acute heart failure, will repeat echo even though 1 was just done earlier this month to evaluate valves, signs for endocarditis - Continue to monitor -Will require treatment through 10/17   Transaminitis-improving - Upward trend noted - At this time most likely related to vascular congestion - Right upper quadrant ultrasound with findings of cirrhosis and will need outpatient GI evaluation - Hepatitis panel negative - Hold Zocor - Trend in the a.m.   Peripheral neuropathy - Continue gabapentin at a reduced dose given AKI   Essential hypertension - Currently controlled, continue to monitor    DVT prophylaxis:apixaban Code Status: DNR Family Communication: None at bedside Disposition Plan:  Status is: Inpatient Remains inpatient appropriate because: Continues to require IV diuresis   Consultants:  Cardiology  Procedures:  Right thoracentesis 9/27 with 700 mL fluid output  Antimicrobials:  Anti-infectives (From admission, onward)    Start     Dose/Rate Route Frequency Ordered Stop   09/10/23 0000  ampicillin (OMNIPEN) 2 g in sodium chloride 0.9 % 100 mL IVPB        2 g 300 mL/hr over 20 Minutes Intravenous Every 6 hours 09/09/23 2319     09/09/23 2315  ampicillin  Status:  Discontinued       Note to Pharmacy: As a continuous infusion Indication:  Endocarditis First Dose: Yes Last Day of Therapy:  09/30/2023 Labs - Once weekly:  CBC/D and BMP, Labs - Once weekly: ESR and CRP   8 g Intravenous Every 24 hours 09/09/23 2216 09/09/23 2319  09/09/23 2315  cefTRIAXone (ROCEPHIN) IVPB  Status:  Discontinued       Note to Pharmacy: Indication:  Endocarditis First Dose: Yes Last Day of Therapy:  09/30/2023 Labs - Once weekly:  CBC/D and BMP, Labs - Once weekly: ESR and CRP   2 g Intravenous Every 12 hours 09/09/23 2216 09/09/23 2220   09/09/23 2300  cefTRIAXone  (ROCEPHIN) 2 g in sodium chloride 0.9 % 100 mL IVPB        2 g 200 mL/hr over 30 Minutes Intravenous Every 12 hours 09/09/23 2220         Subjective: Patient seen and evaluated today with no new acute complaints or concerns. No acute concerns or events noted overnight.  He states that he feels worn out and does not like the beds here.  Denies any chest pain.  Shortness of breath is slowly improving.  Objective: Vitals:   09/11/23 1329 09/11/23 1942 09/12/23 0437 09/12/23 0500  BP: (!) 128/50 136/81 126/63   Pulse: 72 75 62   Resp: 18 20 20    Temp: 97.9 F (36.6 C) 97.6 F (36.4 C) 97.9 F (36.6 C)   TempSrc: Oral     SpO2: 99% 93% 93%   Weight:    72.9 kg  Height:        Intake/Output Summary (Last 24 hours) at 09/12/2023 1306 Last data filed at 09/12/2023 0900 Gross per 24 hour  Intake 610 ml  Output 2751 ml  Net -2141 ml   Filed Weights   09/10/23 0349 09/11/23 0439 09/12/23 0500  Weight: 76.2 kg 75.4 kg 72.9 kg    Examination:  General exam: Appears calm and comfortable  Respiratory system: Crackles bilaterally.  Respiratory effort normal. Cardiovascular system: S1 & S2 heard, RRR.  Gastrointestinal system: Abdomen is soft, mildly distended Central nervous system: Alert and awake Extremities: Bilateral edema Skin: No significant lesions noted Psychiatry: Flat affect. Foley with clear, yellow urine output    Data Reviewed: I have personally reviewed following labs and imaging studies  CBC: Recent Labs  Lab 09/09/23 1849 09/10/23 0412 09/11/23 0416 09/12/23 0409  WBC 10.3 10.1 10.6* 9.8  NEUTROABS  --  7.1  --   --   HGB 10.7* 10.4* 10.7* 10.1*  HCT 34.5* 34.7* 34.8* 33.4*  MCV 98.9 98.9 98.0 97.9  PLT 200 173 166 158   Basic Metabolic Panel: Recent Labs  Lab 09/09/23 1849 09/10/23 0412 09/11/23 0416 09/12/23 0409  NA 137 138 138 137  K 4.4 3.8 3.4* 3.3*  CL 97* 97* 94* 92*  CO2 25 25 28 30   GLUCOSE 107* 93 95 89  BUN 14 16 19 21    CREATININE 1.47* 1.37* 1.48* 1.39*  CALCIUM 7.8* 7.8* 8.0* 7.9*  MG  --  1.3* 1.6* 1.6*   GFR: Estimated Creatinine Clearance: 35.7 mL/min (A) (by C-G formula based on SCr of 1.39 mg/dL (H)). Liver Function Tests: Recent Labs  Lab 09/09/23 1849 09/10/23 0412 09/11/23 0416 09/12/23 0409  AST 311* 559* 264* 161*  ALT 205* 761* 280* 228*  ALKPHOS 53 51 51 50  BILITOT 1.0 0.7 0.8 0.6  PROT 6.3* 6.1* 5.8* 5.7*  ALBUMIN 3.5 3.3* 3.2* 3.2*   No results for input(s): "LIPASE", "AMYLASE" in the last 168 hours. No results for input(s): "AMMONIA" in the last 168 hours. Coagulation Profile: No results for input(s): "INR", "PROTIME" in the last 168 hours. Cardiac Enzymes: No results for input(s): "CKTOTAL", "CKMB", "CKMBINDEX", "TROPONINI" in the last 168 hours. BNP (  last 3 results) No results for input(s): "PROBNP" in the last 8760 hours. HbA1C: No results for input(s): "HGBA1C" in the last 72 hours. CBG: Recent Labs  Lab 09/11/23 1701  GLUCAP 100*   Lipid Profile: No results for input(s): "CHOL", "HDL", "LDLCALC", "TRIG", "CHOLHDL", "LDLDIRECT" in the last 72 hours. Thyroid Function Tests: Recent Labs    09/10/23 0412  TSH 4.006   Anemia Panel: No results for input(s): "VITAMINB12", "FOLATE", "FERRITIN", "TIBC", "IRON", "RETICCTPCT" in the last 72 hours. Sepsis Labs: No results for input(s): "PROCALCITON", "LATICACIDVEN" in the last 168 hours.  No results found for this or any previous visit (from the past 240 hour(s)).       Radiology Studies: DG Chest 1 View  Result Date: 09/11/2023 CLINICAL DATA:  Status post right thoracentesis. EXAM: CHEST  1 VIEW COMPARISON:  09/09/2023 FINDINGS: Slightly improved aeration at the right lung base compatible with recent thoracentesis. Negative for pneumothorax. Right arm PICC line. PICC line tip is difficult evaluate but appears to be near the cavoatrial junction. Stable appearance of the left cardiac device. Heart size is stable.  Prominent vascular markings with hazy lung densities bilaterally. Findings could be related to vascular congestion or pulmonary edema. Persistent hazy densities at the left lung base. IMPRESSION: 1. Slightly improved aeration at the right lung base. Negative for pneumothorax. 2. Persistent hazy lung densities bilaterally. Findings could be related to vascular congestion or pulmonary edema. Electronically Signed   By: Richarda Overlie M.D.   On: 09/11/2023 12:28   US THORACENTESIS ASP PLEURAL SPACE W/IMG GUIDE  Result Date: 09/11/2023 INDICATION: Patient with history of CHF, new onset pleural effusion. Request for therapeutic right thoracentesis. EXAM: ULTRASOUND GUIDED RIGHT THORACENTESIS MEDICATIONS: 6 mL 1% lidocaine COMPLICATIONS: None immediate. PROCEDURE: An ultrasound guided thoracentesis was thoroughly discussed with the patient and questions answered. The benefits, risks, alternatives and complications were also discussed. The patient understands and wishes to proceed with the procedure. Written consent was obtained. Ultrasound was performed to localize and mark an adequate pocket of fluid in the right chest. The area was then prepped and draped in the normal sterile fashion. 1% Lidocaine was used for local anesthesia. Under ultrasound guidance a 6 Fr Safe-T-Centesis catheter was introduced. Thoracentesis was performed. The catheter was removed and a dressing applied. FINDINGS: A total of approximately 700 mL of clear yellow fluid was removed. IMPRESSION: Successful ultrasound guided right thoracentesis yielding 700 mL of pleural fluid. Performed by Lynnette Caffey, PA-C Electronically Signed   By: Richarda Overlie M.D.   On: 09/11/2023 12:05        Scheduled Meds:  apixaban  2.5 mg Oral BID   Chlorhexidine Gluconate Cloth  6 each Topical Q0600   cinacalcet  90 mg Oral Q breakfast   ezetimibe  10 mg Oral Daily   feeding supplement  237 mL Oral BID BM   furosemide  60 mg Intravenous BID   gabapentin   400 mg Oral Daily   losartan  12.5 mg Oral Daily   melatonin  6 mg Oral QHS   potassium chloride  40 mEq Oral BID   spironolactone  12.5 mg Oral Daily   Continuous Infusions:  sodium chloride Stopped (09/11/23 0643)   ampicillin (OMNIPEN) IV 2 g (09/12/23 1206)   cefTRIAXone (ROCEPHIN)  IV 2 g (09/12/23 1057)     LOS: 3 days    Time spent: 35 minutes    Maheen Cwikla Hoover Brunette, DO [facility]  If 7PM-7AM, please contact night-coverage [email or web address] 09/12/2023,  1:06 PM

## 2023-09-13 DIAGNOSIS — I5023 Acute on chronic systolic (congestive) heart failure: Secondary | ICD-10-CM | POA: Diagnosis not present

## 2023-09-13 DIAGNOSIS — I480 Paroxysmal atrial fibrillation: Secondary | ICD-10-CM | POA: Diagnosis not present

## 2023-09-13 DIAGNOSIS — R7401 Elevation of levels of liver transaminase levels: Secondary | ICD-10-CM | POA: Diagnosis not present

## 2023-09-13 DIAGNOSIS — G6289 Other specified polyneuropathies: Secondary | ICD-10-CM | POA: Diagnosis not present

## 2023-09-13 LAB — COMPREHENSIVE METABOLIC PANEL
ALT: 189 U/L — ABNORMAL HIGH (ref 0–44)
AST: 114 U/L — ABNORMAL HIGH (ref 15–41)
Albumin: 3 g/dL — ABNORMAL LOW (ref 3.5–5.0)
Alkaline Phosphatase: 43 U/L (ref 38–126)
Anion gap: 13 (ref 5–15)
BUN: 20 mg/dL (ref 8–23)
CO2: 31 mmol/L (ref 22–32)
Calcium: 7.8 mg/dL — ABNORMAL LOW (ref 8.9–10.3)
Chloride: 92 mmol/L — ABNORMAL LOW (ref 98–111)
Creatinine, Ser: 1.26 mg/dL — ABNORMAL HIGH (ref 0.61–1.24)
GFR, Estimated: 54 mL/min — ABNORMAL LOW (ref >60)
Glucose, Bld: 91 mg/dL (ref 70–99)
Potassium: 3.3 mmol/L — ABNORMAL LOW (ref 3.5–5.1)
Sodium: 136 mmol/L (ref 135–145)
Total Bilirubin: 0.5 mg/dL (ref 0.3–1.2)
Total Protein: 5.4 g/dL — ABNORMAL LOW (ref 6.5–8.1)

## 2023-09-13 LAB — CBC
HCT: 31.1 % — ABNORMAL LOW (ref 39.0–52.0)
Hemoglobin: 9.9 g/dL — ABNORMAL LOW (ref 13.0–17.0)
MCH: 31 pg (ref 26.0–34.0)
MCHC: 31.8 g/dL (ref 30.0–36.0)
MCV: 97.5 fL (ref 80.0–100.0)
Platelets: 141 10*3/uL — ABNORMAL LOW (ref 150–400)
RBC: 3.19 MIL/uL — ABNORMAL LOW (ref 4.22–5.81)
RDW: 15 % (ref 11.5–15.5)
WBC: 7.1 10*3/uL (ref 4.0–10.5)
nRBC: 0 % (ref 0.0–0.2)

## 2023-09-13 LAB — MAGNESIUM: Magnesium: 1.7 mg/dL (ref 1.7–2.4)

## 2023-09-13 MED ORDER — TRAZODONE HCL 50 MG PO TABS
50.0000 mg | ORAL_TABLET | Freq: Every evening | ORAL | Status: DC | PRN
  Administered 2023-09-13: 50 mg via ORAL
  Filled 2023-09-13: qty 1

## 2023-09-13 MED ORDER — POTASSIUM CHLORIDE CRYS ER 20 MEQ PO TBCR
40.0000 meq | EXTENDED_RELEASE_TABLET | Freq: Once | ORAL | Status: AC
  Administered 2023-09-13: 40 meq via ORAL
  Filled 2023-09-13: qty 2

## 2023-09-13 NOTE — Plan of Care (Signed)
  Problem: Fluid Volume: Goal: Hemodynamic stability will improve Outcome: Progressing   Problem: Clinical Measurements: Goal: Diagnostic test results will improve Outcome: Progressing Goal: Signs and symptoms of infection will decrease Outcome: Progressing   Problem: Respiratory: Goal: Ability to maintain adequate ventilation will improve Outcome: Progressing   Problem: Activity: Goal: Risk for activity intolerance will decrease Outcome: Progressing   Problem: Nutrition: Goal: Adequate nutrition will be maintained Outcome: Progressing   Problem: Elimination: Goal: Will not experience complications related to bowel motility Outcome: Progressing Goal: Will not experience complications related to urinary retention Outcome: Progressing   Problem: Pain Managment: Goal: General experience of comfort will improve Outcome: Progressing   Problem: Safety: Goal: Ability to remain free from injury will improve Outcome: Progressing

## 2023-09-13 NOTE — Progress Notes (Signed)
PROGRESS NOTE    Kevin Collins  ZOX:[id number] DOB: 01-Dec-1930 DOA: 09/09/2023 PCP: Toma Deiters, MD   Brief Narrative:    Kevin Collins is a 89 y.o. male with medical history significant of coronary artery disease, hyperlipidemia, hypertension, peripheral neuropathy, recent admission for bacteremia, pacemaker in place, and more presents the ED with a chief complaint of orthopnea.  Patient reports that he wakes up in the middle of the night feeling dizzy and like he might pass out and very short of breath.  He reports that this feeling can last throughout the day until 430 or 5 PM.  He reports it has been going on since his Lasix was discontinued at his last hospitalization.  Patient has been admitted with acute HFrEF and continues to have slow improvement with ongoing diuresis.  Assessment & Plan:   Principal Problem:   CHF (congestive heart failure) (HCC) Active Problems:   Dyslipidemia   Paroxysmal atrial fibrillation (HCC)   Essential hypertension   Peripheral neuropathy   Transaminitis   Bacteremia   AKI (acute kidney injury) (HCC)  Assessment and Plan:  Acute HFrEF - Appreciate cardiology recommendations -2D echocardiogram with EF 35-40% noted - Continue 60 mg IV Lasix twice daily for another 24 hours; hopefully home on 09/14/2023 with adjusted dose of diuretics. - To follow daily weights, strict I's and O's and low-sodium diet. - Continue to monitor -Right-sided thoracentesis performed 9/27 with 700 mL fluid output removed.  Patient reporting significant improvement in his breathing.   Paroxysmal atrial fibrillation (HCC) - Continue Eliquis -No overt bleeding appreciated.   Dyslipidemia - Holding Zocor in the setting of transaminitis which is improving. -Continue heart healthy diet.   AKI (acute kidney injury) (HCC)-improving - Likely related to cardiorenal syndrome - Continue to hold nephrotoxic agents, avoid the use of contrast and hypotension. -Continue  diuresis -Follow renal function trend -Creatinine trending down/stabilizing.  Hypokalemia/Hypomagnesemia -Stable after repletion -Continue to follow electrolytes trend.   Bacteremia - At last hospitalization was diagnosed with Enterococcus faecalis bacteremia - Was discharged on 6 weeks of ampicillin and Rocephin - Continue ampicillin and Rocephin - Given that he did have bacteremia recently and now that he is in acute heart failure, will repeat echo even though 1 was just done earlier this month to evaluate valves, signs for endocarditis - Continue to monitor -Will require treatment through 10/17. -Afebrile.   Transaminitis-improving - Upward trend in the setting of hepatic congestion from acute on chronic heart failure -Continue holding statin -Continue IV diuresis -LFTs trending down adequately -Right upper quadrant ultrasound with suggested underlying cirrhotic changes; outpatient follow-up with GI recommended.   Peripheral neuropathy - Continue gabapentin at a reduced dose given AKI -Continue supportive care.   Essential hypertension - Currently controlled -Continue heart healthy/low-sodium diet -Continue to follow vital signs.    DVT prophylaxis:apixaban Code Status: DNR Family Communication: None at bedside Disposition Plan:  Status is: Inpatient  Remains inpatient appropriate because: Continues to require IV diuresis   Consultants:  Cardiology  Procedures:  Right thoracentesis 9/27 with 700 mL fluid output  Antimicrobials:  Anti-infectives (From admission, onward)    Start     Dose/Rate Route Frequency Ordered Stop   09/10/23 0000  ampicillin (OMNIPEN) 2 g in sodium chloride 0.9 % 100 mL IVPB        2 g 300 mL/hr over 20 Minutes Intravenous Every 6 hours 09/09/23 2319     09/09/23 2315  ampicillin  Status:  Discontinued  Note to Pharmacy: As a continuous infusion Indication:  Endocarditis First Dose: Yes Last Day of Therapy:  09/30/2023 Labs -  Once weekly:  CBC/D and BMP, Labs - Once weekly: ESR and CRP   8 g Intravenous Every 24 hours 09/09/23 2216 09/09/23 2319   09/09/23 2315  cefTRIAXone (ROCEPHIN) IVPB  Status:  Discontinued       Note to Pharmacy: Indication:  Endocarditis First Dose: Yes Last Day of Therapy:  09/30/2023 Labs - Once weekly:  CBC/D and BMP, Labs - Once weekly: ESR and CRP   2 g Intravenous Every 12 hours 09/09/23 2216 09/09/23 2220   09/09/23 2300  cefTRIAXone (ROCEPHIN) 2 g in sodium chloride 0.9 % 100 mL IVPB        2 g 200 mL/hr over 30 Minutes Intravenous Every 12 hours 09/09/23 2220         Subjective: Over 3 L urine output reported overnight.  No chest pain, no nausea, no vomiting.  Status post thoracentesis and good diuresis; no requiring oxygen supplementation at rest.  Patient is in no acute distress and reports breathing close to his baseline.  Objective: Vitals:   09/12/23 1400 09/12/23 2116 09/13/23 0312 09/13/23 1610  BP: 123/66 (!) 129/55 118/62 127/79  Pulse: 63 62 62 78  Resp: 18 20 19 17   Temp:  97.6 F (36.4 C) 98.2 F (36.8 C) 98 F (36.7 C)  TempSrc:   Oral Oral  SpO2: 95% 97% 94% 96%  Weight:   70.6 kg   Height:        Intake/Output Summary (Last 24 hours) at 09/13/2023 1851 Last data filed at 09/13/2023 1834 Gross per 24 hour  Intake 2125.64 ml  Output 4825 ml  Net -2699.36 ml   Filed Weights   09/11/23 0439 09/12/23 0500 09/13/23 0312  Weight: 75.4 kg 72.9 kg 70.6 kg    Examination: General exam: Alert, awake, oriented x 3; no chest pain, no nausea, no vomiting.  Reporting good urine output.  Good saturation on room air after thoracentesis. Respiratory system: Improved air movement bilaterally; no frank crackles appreciated.  Decreased breath sounds at the bases.  Good saturation on room air. Cardiovascular system: Rate controlled, no rubs, no gallops, no JVD. Gastrointestinal system: Abdomen is nondistended, soft and nontender. No organomegaly or masses felt.  Normal bowel sounds heard. GU: Foley catheter in place.  No hematuria appreciated. Central nervous system: No focal neurological deficits. Extremities: No cyanosis or clubbing; trace pedal edema appreciated bilaterally. Skin: No petechiae. Psychiatry: Judgement and insight appear normal. Mood & affect appropriate.   Data Reviewed: I have personally reviewed following labs and imaging studies  CBC: Recent Labs  Lab 09/09/23 1849 09/10/23 0412 09/11/23 0416 09/12/23 0409 09/13/23 0412  WBC 10.3 10.1 10.6* 9.8 7.1  NEUTROABS  --  7.1  --   --   --   HGB 10.7* 10.4* 10.7* 10.1* 9.9*  HCT 34.5* 34.7* 34.8* 33.4* 31.1*  MCV 98.9 98.9 98.0 97.9 97.5  PLT 200 173 166 158 141*   Basic Metabolic Panel: Recent Labs  Lab 09/09/23 1849 09/10/23 0412 09/11/23 0416 09/12/23 0409 09/13/23 0412  NA 137 138 138 137 136  K 4.4 3.8 3.4* 3.3* 3.3*  CL 97* 97* 94* 92* 92*  CO2 25 25 28 30 31   GLUCOSE 107* 93 95 89 91  BUN 14 16 19 21 20   CREATININE 1.47* 1.37* 1.48* 1.39* 1.26*  CALCIUM 7.8* 7.8* 8.0* 7.9* 7.8*  MG  --  1.3* 1.6* 1.6* 1.7   GFR: Estimated Creatinine Clearance: 38.1 mL/min (A) (by C-G formula based on SCr of 1.26 mg/dL (H)).  Liver Function Tests: Recent Labs  Lab 09/09/23 1849 09/10/23 0412 09/11/23 0416 09/12/23 0409 09/13/23 0412  AST 311* 559* 264* 161* 114*  ALT 205* 761* 280* 228* 189*  ALKPHOS 53 51 51 50 43  BILITOT 1.0 0.7 0.8 0.6 0.5  PROT 6.3* 6.1* 5.8* 5.7* 5.4*  ALBUMIN 3.5 3.3* 3.2* 3.2* 3.0*   CBG: Recent Labs  Lab 09/11/23 1701  GLUCAP 100*   Radiology Studies: No results found.   Scheduled Meds:  apixaban  2.5 mg Oral BID   Chlorhexidine Gluconate Cloth  6 each Topical Q0600   cinacalcet  90 mg Oral Q breakfast   ezetimibe  10 mg Oral Daily   feeding supplement  237 mL Oral BID BM   furosemide  60 mg Intravenous BID   gabapentin  400 mg Oral Daily   losartan  12.5 mg Oral Daily   melatonin  6 mg Oral QHS   spironolactone  12.5  mg Oral Daily   Continuous Infusions:  sodium chloride Stopped (09/11/23 0643)   ampicillin (OMNIPEN) IV 2 g (09/13/23 1712)   cefTRIAXone (ROCEPHIN)  IV 2 g (09/13/23 1018)     LOS: 4 days    Time spent: 50 minutes    Vassie Loll, MD [facility]  If 7PM-7AM, please contact night-coverage [email or web address] 09/13/2023, 6:51 PM

## 2023-09-14 DIAGNOSIS — I5023 Acute on chronic systolic (congestive) heart failure: Secondary | ICD-10-CM | POA: Diagnosis not present

## 2023-09-14 DIAGNOSIS — N179 Acute kidney failure, unspecified: Secondary | ICD-10-CM | POA: Diagnosis not present

## 2023-09-14 DIAGNOSIS — I5020 Unspecified systolic (congestive) heart failure: Secondary | ICD-10-CM | POA: Diagnosis not present

## 2023-09-14 DIAGNOSIS — R7401 Elevation of levels of liver transaminase levels: Secondary | ICD-10-CM | POA: Diagnosis not present

## 2023-09-14 DIAGNOSIS — R7881 Bacteremia: Secondary | ICD-10-CM | POA: Diagnosis not present

## 2023-09-14 LAB — BASIC METABOLIC PANEL
Anion gap: 14 (ref 5–15)
BUN: 17 mg/dL (ref 8–23)
CO2: 31 mmol/L (ref 22–32)
Calcium: 8 mg/dL — ABNORMAL LOW (ref 8.9–10.3)
Chloride: 89 mmol/L — ABNORMAL LOW (ref 98–111)
Creatinine, Ser: 1.24 mg/dL (ref 0.61–1.24)
GFR, Estimated: 55 mL/min — ABNORMAL LOW (ref >60)
Glucose, Bld: 110 mg/dL — ABNORMAL HIGH (ref 70–99)
Potassium: 3.4 mmol/L — ABNORMAL LOW (ref 3.5–5.1)
Sodium: 134 mmol/L — ABNORMAL LOW (ref 135–145)

## 2023-09-14 MED ORDER — FUROSEMIDE 40 MG PO TABS: 40.0000 mg | ORAL_TABLET | Freq: Every day | ORAL | 2 refills | Status: DC

## 2023-09-14 MED ORDER — POTASSIUM CHLORIDE CRYS ER 20 MEQ PO TBCR: 20.0000 meq | EXTENDED_RELEASE_TABLET | Freq: Every day | ORAL | 0 refills | Status: DC

## 2023-09-14 MED ORDER — LOSARTAN POTASSIUM 25 MG PO TABS: 12.5000 mg | ORAL_TABLET | Freq: Every day | ORAL | 1 refills | Status: DC

## 2023-09-14 MED ORDER — AMPICILLIN IV (FOR PTA / DISCHARGE USE ONLY): 8.0000 g | INTRAVENOUS | 0 refills | Status: AC

## 2023-09-14 MED ORDER — CEFTRIAXONE IV (FOR PTA / DISCHARGE USE ONLY): 2.0000 g | Freq: Two times a day (BID) | INTRAVENOUS | 0 refills | Status: AC

## 2023-09-14 MED ORDER — ATORVASTATIN CALCIUM 40 MG PO TABS: 40.0000 mg | ORAL_TABLET | Freq: Every day | ORAL | 11 refills | Status: DC

## 2023-09-14 MED ORDER — GABAPENTIN 300 MG PO CAPS: 300.0000 mg | ORAL_CAPSULE | Freq: Three times a day (TID) | ORAL | 2 refills | Status: DC

## 2023-09-14 MED ORDER — SPIRONOLACTONE 25 MG PO TABS: 12.5000 mg | ORAL_TABLET | Freq: Every day | ORAL | 1 refills | Status: DC

## 2023-09-14 MED ORDER — FUROSEMIDE 40 MG PO TABS
40.0000 mg | ORAL_TABLET | Freq: Every day | ORAL | Status: DC
  Administered 2023-09-14: 40 mg via ORAL
  Filled 2023-09-14: qty 1

## 2023-09-14 NOTE — Discharge Summary (Signed)
[facility] not [facility], placement could not be confirmed due to current cardiac rhythm.  ECHOCARDIOGRAM COMPLETE  Result Date: 08/20/2023    ECHOCARDIOGRAM REPORT   Patient Name:   Kevin Collins Date of Exam: 08/20/2023 Medical Rec #:  [id number]   Height:       70.0 in Accession #:    [id number]  Weight:       163.8 lb Date of Birth:  02-12-1930   BSA:          1.918 m Patient Age:    89 years    BP:           113/54 mmHg Patient Gender: M           HR:           63 bpm. Exam Location:  Inpatient Procedure: 2D Echo, Cardiac Doppler, Color Doppler and Intracardiac            Opacification Agent Indications:    Bacteremia R78.81  History:        Patient has prior history of Echocardiogram examinations, most                 recent 01/14/2022. CAD; Risk Factors:Dyslipidemia and                 Hypertension.   Sonographer:    Harriette Bouillon RDCS Referring Phys: [id number] [facility] IMPRESSIONS  1. Left ventricular ejection fraction, by estimation, is 50 to 55%. The left ventricle has low normal function. The left ventricle has no regional wall motion abnormalities. The left ventricular internal cavity size was mildly dilated. Left ventricular diastolic parameters are indeterminate.  2. Right ventricular systolic function is normal. The right ventricular size is mildly enlarged. There is moderately elevated pulmonary artery systolic pressure.  3. Left atrial size was severely dilated.  4. Right atrial size was severely dilated.  5. The mitral valve is normal in structure. Mild mitral valve regurgitation. No evidence of mitral stenosis.  6. The aortic valve is tricuspid. Aortic valve regurgitation is mild. No aortic stenosis is present.  7. The inferior vena cava is dilated in size with <50% respiratory variability, suggesting right atrial pressure of 15 mmHg. FINDINGS  Left Ventricle: Left ventricular ejection fraction, by estimation, is 50 to 55%. The left ventricle has low normal function. The left ventricle has no regional wall motion abnormalities. Definity contrast agent was given IV to delineate the left ventricular endocardial borders. The left ventricular internal cavity size was mildly dilated. There is no left ventricular hypertrophy. Left ventricular diastolic parameters are indeterminate. Right Ventricle: The right ventricular size is mildly enlarged. Right ventricular systolic function is normal. There is moderately elevated pulmonary artery systolic pressure. The tricuspid regurgitant velocity is 2.88 m/s, and with an assumed right atrial pressure of 15 mmHg, the estimated right ventricular systolic pressure is 48.2 mmHg. Left Atrium: Left atrial size was severely dilated. Right Atrium: Right atrial size was severely dilated. Pericardium: There is no evidence of pericardial effusion. Mitral Valve: The  mitral valve is normal in structure. Mild mitral valve regurgitation. No evidence of mitral valve stenosis. Tricuspid Valve: The tricuspid valve is normal in structure. Tricuspid valve regurgitation is mild . No evidence of tricuspid stenosis. Aortic Valve: The aortic valve is tricuspid. Aortic valve regurgitation is mild. No aortic stenosis is present. Pulmonic Valve: The pulmonic valve was normal in structure. Pulmonic valve regurgitation is trivial. No evidence of pulmonic stenosis. Aorta: The aortic root is normal in size  Physician Discharge Summary   Patient: Kevin Collins MRN: [id number] DOB: [birth year]  Admit date:     09/09/2023  Discharge date: 09/14/23  Discharge Physician: Vassie Loll   PCP: Toma Deiters, MD   Recommendations at discharge:  Repeat complete metabolic panel to follow electrolytes, LFTs and renal function trend/stability. Reassess patient volume status and further adjust diuretics as/antihypertensive regimen -Patient to follow-up with cardiology service for further GDMT adjustments. Arrange outpatient follow-up with gastroenterology service to follow normal   Discharge Diagnoses: Principal Problem:   CHF (congestive heart failure) (HCC) Active Problems:   Dyslipidemia   Paroxysmal atrial fibrillation (HCC)   Essential hypertension   Peripheral neuropathy   Transaminitis   Bacteremia   AKI (acute kidney injury) (HCC)  Brief Narrative:   Kevin Collins is a 89 y.o. male with medical history significant of coronary artery disease, hyperlipidemia, hypertension, peripheral neuropathy, recent admission for bacteremia, pacemaker in place, and more presents the ED with a chief complaint of orthopnea.  Patient reports that he wakes up in the middle of the night feeling dizzy and like he might pass out and very short of breath.  He reports that this feeling can last throughout the day until 430 or 5 PM.  He reports it has been going on since his Lasix was discontinued at his last hospitalization.  Patient has been admitted with acute HFrEF and continues to have slow improvement with ongoing diuresis.  Assessment and Plan: Acute HFrEF - Appreciate cardiology recommendations -2D echocardiogram with EF 35-40% noted - Stable and with breathing back to baseline -Will go home with adjusted dose of diuretics as per cardiology recommendations. -No oxygen supplementation required -Excellent response in his overall breathing after Right-sided thoracentesis performed 9/27 with 700 mL fluid output  removed.   -Continue low-sodium diet and daily weights.   Paroxysmal atrial fibrillation (HCC) - Continue Eliquis -No overt bleeding appreciated. -Repeat CBC to follow hemoglobin trend.   Dyslipidemia - Holding Zocor in the setting of transaminitis -At discharge planning for patient to start Lipitor 40 mg daily -Continue heart healthy diet.   AKI (acute kidney injury) (HCC)-improving -Likely related to cardiorenal syndrome -Continue to hold nephrotoxic agents, avoid the use of contrast and hypotension. -Continue diuresis -Follow renal function trend -Creatinine trending down/stabilizing.   Hypokalemia/Hypomagnesemia -Stable after repletion -Continue to follow electrolytes trend. -Daily supplementation recommended at discharge (especially now that he will be taking diuretics.   Bacteremia - At last hospitalization was diagnosed with Enterococcus faecalis bacteremia - Was discharged on 6 weeks of ampicillin and Rocephin - Continue ampicillin and Rocephin - Given that he did have bacteremia recently and now that he is in acute heart failure, will repeat echo even though 1 was just done earlier this month to evaluate valves, signs for endocarditis - Continue to monitor -Will require treatment through 10/17. -Afebrile.   Transaminitis- - Upward trend in the setting of hepatic congestion from acute on chronic heart failure -Continue treatment with diuretics as per cardiology recommendation -Heart healthy diet recommended -LFTs trending down adequately; overall condition improving/resolving -Continue to follow LFTs -At discharge adjusted dose of his statin has been recommended and started. -Right upper quadrant ultrasound with suggested underlying cirrhotic changes; outpatient follow-up with GI recommended.   Peripheral neuropathy - Continue gabapentin at a reduced dose  -Continue to follow clinical response.   Essential hypertension - Currently controlled -Continue heart  healthy/low-sodium diet -Continue to follow vital signs. -Continue adjusted diuretics and initiation of losartan as per cardiology recommendation.  [facility] image not attached, placement could not be confirmed due to current cardiac rhythm.  ECHOCARDIOGRAM COMPLETE  Result Date: 08/20/2023    ECHOCARDIOGRAM REPORT   Patient Name:   Kevin Collins Date of Exam: 08/20/2023 Medical Rec #:  [id number]   Height:       70.0 in Accession #:    [id number]  Weight:       163.8 lb Date of Birth:  02-12-1930   BSA:          1.918 m Patient Age:    89 years    BP:           113/54 mmHg Patient Gender: M           HR:           63 bpm. Exam Location:  Inpatient Procedure: 2D Echo, Cardiac Doppler, Color Doppler and Intracardiac            Opacification Agent Indications:    Bacteremia R78.81  History:        Patient has prior history of Echocardiogram examinations, most                 recent 01/14/2022. CAD; Risk Factors:Dyslipidemia and                 Hypertension.   Sonographer:    Harriette Bouillon RDCS Referring Phys: [id number] [facility] IMPRESSIONS  1. Left ventricular ejection fraction, by estimation, is 50 to 55%. The left ventricle has low normal function. The left ventricle has no regional wall motion abnormalities. The left ventricular internal cavity size was mildly dilated. Left ventricular diastolic parameters are indeterminate.  2. Right ventricular systolic function is normal. The right ventricular size is mildly enlarged. There is moderately elevated pulmonary artery systolic pressure.  3. Left atrial size was severely dilated.  4. Right atrial size was severely dilated.  5. The mitral valve is normal in structure. Mild mitral valve regurgitation. No evidence of mitral stenosis.  6. The aortic valve is tricuspid. Aortic valve regurgitation is mild. No aortic stenosis is present.  7. The inferior vena cava is dilated in size with <50% respiratory variability, suggesting right atrial pressure of 15 mmHg. FINDINGS  Left Ventricle: Left ventricular ejection fraction, by estimation, is 50 to 55%. The left ventricle has low normal function. The left ventricle has no regional wall motion abnormalities. Definity contrast agent was given IV to delineate the left ventricular endocardial borders. The left ventricular internal cavity size was mildly dilated. There is no left ventricular hypertrophy. Left ventricular diastolic parameters are indeterminate. Right Ventricle: The right ventricular size is mildly enlarged. Right ventricular systolic function is normal. There is moderately elevated pulmonary artery systolic pressure. The tricuspid regurgitant velocity is 2.88 m/s, and with an assumed right atrial pressure of 15 mmHg, the estimated right ventricular systolic pressure is 48.2 mmHg. Left Atrium: Left atrial size was severely dilated. Right Atrium: Right atrial size was severely dilated. Pericardium: There is no evidence of pericardial effusion. Mitral Valve: The  mitral valve is normal in structure. Mild mitral valve regurgitation. No evidence of mitral valve stenosis. Tricuspid Valve: The tricuspid valve is normal in structure. Tricuspid valve regurgitation is mild . No evidence of tricuspid stenosis. Aortic Valve: The aortic valve is tricuspid. Aortic valve regurgitation is mild. No aortic stenosis is present. Pulmonic Valve: The pulmonic valve was normal in structure. Pulmonic valve regurgitation is trivial. No evidence of pulmonic stenosis. Aorta: The aortic root is normal in size  ascites.  Small right pleural IMPRESSION: 1. Increased hepatic parenchymal echogenicity and mildly coarse contour of the liver, consistent hepatic steatosis and suspicious for cirrhosis. 2. Small volume ascites. 3. Small right pleural effusion. 4. Cholelithiasis without evidence of acute cholecystitis. Electronically Signed   By: Jearld Lesch M.D.   On:  09/10/2023 11:46   ECHOCARDIOGRAM COMPLETE  Result Date: 09/10/2023    ECHOCARDIOGRAM REPORT   Patient Name:   Kevin Collins Date of Exam: 09/10/2023 Medical Rec #:  [id number]   Height:       70.0 in Accession #:    [id number]  Weight:       168.0 lb Date of Birth:  [birth year]   BSA:          1.938 m Patient Age:    89 years    BP:           144/76 mmHg Patient Gender: M           HR:           63 bpm. [facility] Procedure: 2D Echo, Cardiac Doppler and Color Doppler Indications:    Congestive Heart Failure I50.9  History:        Patient has prior history of Echocardiogram examinations, most                 recent 08/20/2023. CHF, CAD, Arrythmias:Atrial Fibrillation and                 Bradycardia; Risk Factors:Hypertension and Dyslipidemia.  Sonographer:    Celesta Gentile RCS Referring Phys: [id number] [facility] IMPRESSIONS  1. Left ventricular ejection fraction, by estimation, is 35 to 40%. The left ventricle has moderately decreased function. The left ventricle demonstrates global hypokinesis. Left ventricular diastolic parameters are consistent with Grade III diastolic dysfunction (restrictive). Elevated left atrial pressure. There is the interventricular septum is flattened in systole, consistent with right ventricular pressure overload.  2. Right ventricular systolic function is mildly reduced. The right ventricular size is mild to moderately enlarged. There is moderately elevated pulmonary artery systolic pressure.  3. Left atrial size was severely dilated.  4. Right atrial size was severely dilated.  5. The mitral valve is abnormal. Moderate mitral valve regurgitation. No evidence of mitral stenosis.  6. The tricuspid valve is abnormal. Tricuspid valve regurgitation is mild to moderate.  7. The aortic valve is tricuspid. Aortic valve regurgitation is mild. No aortic stenosis is present.  8. The inferior vena cava is dilated in size with <50% respiratory variability, suggesting right  atrial pressure of 15 mmHg. FINDINGS  Left Ventricle: Left ventricular ejection fraction, by estimation, is 35 to 40%. The left ventricle has moderately decreased function. The left ventricle demonstrates global hypokinesis. The left ventricular internal cavity size was normal in size. There is no left ventricular hypertrophy. The interventricular septum is flattened in systole, consistent with right ventricular pressure overload. Left ventricular diastolic parameters are consistent with Grade III diastolic dysfunction (restrictive). Elevated left atrial pressure. Right Ventricle: The right ventricular size is mild to moderately enlarged. Right vetricular wall thickness was not well visualized. Right ventricular systolic function is mildly reduced. There is moderately elevated pulmonary artery systolic pressure. The tricuspid regurgitant velocity is 2.96 m/s, and with an assumed right atrial pressure of 15 mmHg, the estimated right ventricular systolic pressure is 50.0 mmHg. Left Atrium: Left atrial size was severely dilated. Right Atrium: Right atrial size was severely dilated. Pericardium: There is no evidence of pericardial  [facility] image not attached, placement could not be confirmed due to current cardiac rhythm.  ECHOCARDIOGRAM COMPLETE  Result Date: 08/20/2023    ECHOCARDIOGRAM REPORT   Patient Name:   Kevin Collins Date of Exam: 08/20/2023 Medical Rec #:  [id number]   Height:       70.0 in Accession #:    [id number]  Weight:       163.8 lb Date of Birth:  02-12-1930   BSA:          1.918 m Patient Age:    89 years    BP:           113/54 mmHg Patient Gender: M           HR:           63 bpm. Exam Location:  Inpatient Procedure: 2D Echo, Cardiac Doppler, Color Doppler and Intracardiac            Opacification Agent Indications:    Bacteremia R78.81  History:        Patient has prior history of Echocardiogram examinations, most                 recent 01/14/2022. CAD; Risk Factors:Dyslipidemia and                 Hypertension.   Sonographer:    Harriette Bouillon RDCS Referring Phys: [id number] [facility] IMPRESSIONS  1. Left ventricular ejection fraction, by estimation, is 50 to 55%. The left ventricle has low normal function. The left ventricle has no regional wall motion abnormalities. The left ventricular internal cavity size was mildly dilated. Left ventricular diastolic parameters are indeterminate.  2. Right ventricular systolic function is normal. The right ventricular size is mildly enlarged. There is moderately elevated pulmonary artery systolic pressure.  3. Left atrial size was severely dilated.  4. Right atrial size was severely dilated.  5. The mitral valve is normal in structure. Mild mitral valve regurgitation. No evidence of mitral stenosis.  6. The aortic valve is tricuspid. Aortic valve regurgitation is mild. No aortic stenosis is present.  7. The inferior vena cava is dilated in size with <50% respiratory variability, suggesting right atrial pressure of 15 mmHg. FINDINGS  Left Ventricle: Left ventricular ejection fraction, by estimation, is 50 to 55%. The left ventricle has low normal function. The left ventricle has no regional wall motion abnormalities. Definity contrast agent was given IV to delineate the left ventricular endocardial borders. The left ventricular internal cavity size was mildly dilated. There is no left ventricular hypertrophy. Left ventricular diastolic parameters are indeterminate. Right Ventricle: The right ventricular size is mildly enlarged. Right ventricular systolic function is normal. There is moderately elevated pulmonary artery systolic pressure. The tricuspid regurgitant velocity is 2.88 m/s, and with an assumed right atrial pressure of 15 mmHg, the estimated right ventricular systolic pressure is 48.2 mmHg. Left Atrium: Left atrial size was severely dilated. Right Atrium: Right atrial size was severely dilated. Pericardium: There is no evidence of pericardial effusion. Mitral Valve: The  mitral valve is normal in structure. Mild mitral valve regurgitation. No evidence of mitral valve stenosis. Tricuspid Valve: The tricuspid valve is normal in structure. Tricuspid valve regurgitation is mild . No evidence of tricuspid stenosis. Aortic Valve: The aortic valve is tricuspid. Aortic valve regurgitation is mild. No aortic stenosis is present. Pulmonic Valve: The pulmonic valve was normal in structure. Pulmonic valve regurgitation is trivial. No evidence of pulmonic stenosis. Aorta: The aortic root is normal in size  [facility] image not attached, placement could not be confirmed due to current cardiac rhythm.  ECHOCARDIOGRAM COMPLETE  Result Date: 08/20/2023    ECHOCARDIOGRAM REPORT   Patient Name:   Kevin Collins Date of Exam: 08/20/2023 Medical Rec #:  [id number]   Height:       70.0 in Accession #:    [id number]  Weight:       163.8 lb Date of Birth:  02-12-1930   BSA:          1.918 m Patient Age:    89 years    BP:           113/54 mmHg Patient Gender: M           HR:           63 bpm. Exam Location:  Inpatient Procedure: 2D Echo, Cardiac Doppler, Color Doppler and Intracardiac            Opacification Agent Indications:    Bacteremia R78.81  History:        Patient has prior history of Echocardiogram examinations, most                 recent 01/14/2022. CAD; Risk Factors:Dyslipidemia and                 Hypertension.   Sonographer:    Harriette Bouillon RDCS Referring Phys: [id number] [facility] IMPRESSIONS  1. Left ventricular ejection fraction, by estimation, is 50 to 55%. The left ventricle has low normal function. The left ventricle has no regional wall motion abnormalities. The left ventricular internal cavity size was mildly dilated. Left ventricular diastolic parameters are indeterminate.  2. Right ventricular systolic function is normal. The right ventricular size is mildly enlarged. There is moderately elevated pulmonary artery systolic pressure.  3. Left atrial size was severely dilated.  4. Right atrial size was severely dilated.  5. The mitral valve is normal in structure. Mild mitral valve regurgitation. No evidence of mitral stenosis.  6. The aortic valve is tricuspid. Aortic valve regurgitation is mild. No aortic stenosis is present.  7. The inferior vena cava is dilated in size with <50% respiratory variability, suggesting right atrial pressure of 15 mmHg. FINDINGS  Left Ventricle: Left ventricular ejection fraction, by estimation, is 50 to 55%. The left ventricle has low normal function. The left ventricle has no regional wall motion abnormalities. Definity contrast agent was given IV to delineate the left ventricular endocardial borders. The left ventricular internal cavity size was mildly dilated. There is no left ventricular hypertrophy. Left ventricular diastolic parameters are indeterminate. Right Ventricle: The right ventricular size is mildly enlarged. Right ventricular systolic function is normal. There is moderately elevated pulmonary artery systolic pressure. The tricuspid regurgitant velocity is 2.88 m/s, and with an assumed right atrial pressure of 15 mmHg, the estimated right ventricular systolic pressure is 48.2 mmHg. Left Atrium: Left atrial size was severely dilated. Right Atrium: Right atrial size was severely dilated. Pericardium: There is no evidence of pericardial effusion. Mitral Valve: The  mitral valve is normal in structure. Mild mitral valve regurgitation. No evidence of mitral valve stenosis. Tricuspid Valve: The tricuspid valve is normal in structure. Tricuspid valve regurgitation is mild . No evidence of tricuspid stenosis. Aortic Valve: The aortic valve is tricuspid. Aortic valve regurgitation is mild. No aortic stenosis is present. Pulmonic Valve: The pulmonic valve was normal in structure. Pulmonic valve regurgitation is trivial. No evidence of pulmonic stenosis. Aorta: The aortic root is normal in size  ascites.  Small right pleural IMPRESSION: 1. Increased hepatic parenchymal echogenicity and mildly coarse contour of the liver, consistent hepatic steatosis and suspicious for cirrhosis. 2. Small volume ascites. 3. Small right pleural effusion. 4. Cholelithiasis without evidence of acute cholecystitis. Electronically Signed   By: Jearld Lesch M.D.   On:  09/10/2023 11:46   ECHOCARDIOGRAM COMPLETE  Result Date: 09/10/2023    ECHOCARDIOGRAM REPORT   Patient Name:   Kevin Collins Date of Exam: 09/10/2023 Medical Rec #:  [id number]   Height:       70.0 in Accession #:    [id number]  Weight:       168.0 lb Date of Birth:  [birth year]   BSA:          1.938 m Patient Age:    89 years    BP:           144/76 mmHg Patient Gender: M           HR:           63 bpm. [facility] Procedure: 2D Echo, Cardiac Doppler and Color Doppler Indications:    Congestive Heart Failure I50.9  History:        Patient has prior history of Echocardiogram examinations, most                 recent 08/20/2023. CHF, CAD, Arrythmias:Atrial Fibrillation and                 Bradycardia; Risk Factors:Hypertension and Dyslipidemia.  Sonographer:    Celesta Gentile RCS Referring Phys: [id number] [facility] IMPRESSIONS  1. Left ventricular ejection fraction, by estimation, is 35 to 40%. The left ventricle has moderately decreased function. The left ventricle demonstrates global hypokinesis. Left ventricular diastolic parameters are consistent with Grade III diastolic dysfunction (restrictive). Elevated left atrial pressure. There is the interventricular septum is flattened in systole, consistent with right ventricular pressure overload.  2. Right ventricular systolic function is mildly reduced. The right ventricular size is mild to moderately enlarged. There is moderately elevated pulmonary artery systolic pressure.  3. Left atrial size was severely dilated.  4. Right atrial size was severely dilated.  5. The mitral valve is abnormal. Moderate mitral valve regurgitation. No evidence of mitral stenosis.  6. The tricuspid valve is abnormal. Tricuspid valve regurgitation is mild to moderate.  7. The aortic valve is tricuspid. Aortic valve regurgitation is mild. No aortic stenosis is present.  8. The inferior vena cava is dilated in size with <50% respiratory variability, suggesting right  atrial pressure of 15 mmHg. FINDINGS  Left Ventricle: Left ventricular ejection fraction, by estimation, is 35 to 40%. The left ventricle has moderately decreased function. The left ventricle demonstrates global hypokinesis. The left ventricular internal cavity size was normal in size. There is no left ventricular hypertrophy. The interventricular septum is flattened in systole, consistent with right ventricular pressure overload. Left ventricular diastolic parameters are consistent with Grade III diastolic dysfunction (restrictive). Elevated left atrial pressure. Right Ventricle: The right ventricular size is mild to moderately enlarged. Right vetricular wall thickness was not well visualized. Right ventricular systolic function is mildly reduced. There is moderately elevated pulmonary artery systolic pressure. The tricuspid regurgitant velocity is 2.96 m/s, and with an assumed right atrial pressure of 15 mmHg, the estimated right ventricular systolic pressure is 50.0 mmHg. Left Atrium: Left atrial size was severely dilated. Right Atrium: Right atrial size was severely dilated. Pericardium: There is no evidence of pericardial  [facility] image not attached, placement could not be confirmed due to current cardiac rhythm.  ECHOCARDIOGRAM COMPLETE  Result Date: 08/20/2023    ECHOCARDIOGRAM REPORT   Patient Name:   Kevin Collins Date of Exam: 08/20/2023 Medical Rec #:  [id number]   Height:       70.0 in Accession #:    [id number]  Weight:       163.8 lb Date of Birth:  02-12-1930   BSA:          1.918 m Patient Age:    89 years    BP:           113/54 mmHg Patient Gender: M           HR:           63 bpm. Exam Location:  Inpatient Procedure: 2D Echo, Cardiac Doppler, Color Doppler and Intracardiac            Opacification Agent Indications:    Bacteremia R78.81  History:        Patient has prior history of Echocardiogram examinations, most                 recent 01/14/2022. CAD; Risk Factors:Dyslipidemia and                 Hypertension.   Sonographer:    Harriette Bouillon RDCS Referring Phys: [id number] [facility] IMPRESSIONS  1. Left ventricular ejection fraction, by estimation, is 50 to 55%. The left ventricle has low normal function. The left ventricle has no regional wall motion abnormalities. The left ventricular internal cavity size was mildly dilated. Left ventricular diastolic parameters are indeterminate.  2. Right ventricular systolic function is normal. The right ventricular size is mildly enlarged. There is moderately elevated pulmonary artery systolic pressure.  3. Left atrial size was severely dilated.  4. Right atrial size was severely dilated.  5. The mitral valve is normal in structure. Mild mitral valve regurgitation. No evidence of mitral stenosis.  6. The aortic valve is tricuspid. Aortic valve regurgitation is mild. No aortic stenosis is present.  7. The inferior vena cava is dilated in size with <50% respiratory variability, suggesting right atrial pressure of 15 mmHg. FINDINGS  Left Ventricle: Left ventricular ejection fraction, by estimation, is 50 to 55%. The left ventricle has low normal function. The left ventricle has no regional wall motion abnormalities. Definity contrast agent was given IV to delineate the left ventricular endocardial borders. The left ventricular internal cavity size was mildly dilated. There is no left ventricular hypertrophy. Left ventricular diastolic parameters are indeterminate. Right Ventricle: The right ventricular size is mildly enlarged. Right ventricular systolic function is normal. There is moderately elevated pulmonary artery systolic pressure. The tricuspid regurgitant velocity is 2.88 m/s, and with an assumed right atrial pressure of 15 mmHg, the estimated right ventricular systolic pressure is 48.2 mmHg. Left Atrium: Left atrial size was severely dilated. Right Atrium: Right atrial size was severely dilated. Pericardium: There is no evidence of pericardial effusion. Mitral Valve: The  mitral valve is normal in structure. Mild mitral valve regurgitation. No evidence of mitral valve stenosis. Tricuspid Valve: The tricuspid valve is normal in structure. Tricuspid valve regurgitation is mild . No evidence of tricuspid stenosis. Aortic Valve: The aortic valve is tricuspid. Aortic valve regurgitation is mild. No aortic stenosis is present. Pulmonic Valve: The pulmonic valve was normal in structure. Pulmonic valve regurgitation is trivial. No evidence of pulmonic stenosis. Aorta: The aortic root is normal in size  ascites.  Small right pleural IMPRESSION: 1. Increased hepatic parenchymal echogenicity and mildly coarse contour of the liver, consistent hepatic steatosis and suspicious for cirrhosis. 2. Small volume ascites. 3. Small right pleural effusion. 4. Cholelithiasis without evidence of acute cholecystitis. Electronically Signed   By: Jearld Lesch M.D.   On:  09/10/2023 11:46   ECHOCARDIOGRAM COMPLETE  Result Date: 09/10/2023    ECHOCARDIOGRAM REPORT   Patient Name:   Kevin Collins Date of Exam: 09/10/2023 Medical Rec #:  [id number]   Height:       70.0 in Accession #:    [id number]  Weight:       168.0 lb Date of Birth:  [birth year]   BSA:          1.938 m Patient Age:    89 years    BP:           144/76 mmHg Patient Gender: M           HR:           63 bpm. [facility] Procedure: 2D Echo, Cardiac Doppler and Color Doppler Indications:    Congestive Heart Failure I50.9  History:        Patient has prior history of Echocardiogram examinations, most                 recent 08/20/2023. CHF, CAD, Arrythmias:Atrial Fibrillation and                 Bradycardia; Risk Factors:Hypertension and Dyslipidemia.  Sonographer:    Celesta Gentile RCS Referring Phys: [id number] [facility] IMPRESSIONS  1. Left ventricular ejection fraction, by estimation, is 35 to 40%. The left ventricle has moderately decreased function. The left ventricle demonstrates global hypokinesis. Left ventricular diastolic parameters are consistent with Grade III diastolic dysfunction (restrictive). Elevated left atrial pressure. There is the interventricular septum is flattened in systole, consistent with right ventricular pressure overload.  2. Right ventricular systolic function is mildly reduced. The right ventricular size is mild to moderately enlarged. There is moderately elevated pulmonary artery systolic pressure.  3. Left atrial size was severely dilated.  4. Right atrial size was severely dilated.  5. The mitral valve is abnormal. Moderate mitral valve regurgitation. No evidence of mitral stenosis.  6. The tricuspid valve is abnormal. Tricuspid valve regurgitation is mild to moderate.  7. The aortic valve is tricuspid. Aortic valve regurgitation is mild. No aortic stenosis is present.  8. The inferior vena cava is dilated in size with <50% respiratory variability, suggesting right  atrial pressure of 15 mmHg. FINDINGS  Left Ventricle: Left ventricular ejection fraction, by estimation, is 35 to 40%. The left ventricle has moderately decreased function. The left ventricle demonstrates global hypokinesis. The left ventricular internal cavity size was normal in size. There is no left ventricular hypertrophy. The interventricular septum is flattened in systole, consistent with right ventricular pressure overload. Left ventricular diastolic parameters are consistent with Grade III diastolic dysfunction (restrictive). Elevated left atrial pressure. Right Ventricle: The right ventricular size is mild to moderately enlarged. Right vetricular wall thickness was not well visualized. Right ventricular systolic function is mildly reduced. There is moderately elevated pulmonary artery systolic pressure. The tricuspid regurgitant velocity is 2.96 m/s, and with an assumed right atrial pressure of 15 mmHg, the estimated right ventricular systolic pressure is 50.0 mmHg. Left Atrium: Left atrial size was severely dilated. Right Atrium: Right atrial size was severely dilated. Pericardium: There is no evidence of pericardial  ascites.  Small right pleural IMPRESSION: 1. Increased hepatic parenchymal echogenicity and mildly coarse contour of the liver, consistent hepatic steatosis and suspicious for cirrhosis. 2. Small volume ascites. 3. Small right pleural effusion. 4. Cholelithiasis without evidence of acute cholecystitis. Electronically Signed   By: Jearld Lesch M.D.   On:  09/10/2023 11:46   ECHOCARDIOGRAM COMPLETE  Result Date: 09/10/2023    ECHOCARDIOGRAM REPORT   Patient Name:   Kevin Collins Date of Exam: 09/10/2023 Medical Rec #:  [id number]   Height:       70.0 in Accession #:    [id number]  Weight:       168.0 lb Date of Birth:  [birth year]   BSA:          1.938 m Patient Age:    89 years    BP:           144/76 mmHg Patient Gender: M           HR:           63 bpm. [facility] Procedure: 2D Echo, Cardiac Doppler and Color Doppler Indications:    Congestive Heart Failure I50.9  History:        Patient has prior history of Echocardiogram examinations, most                 recent 08/20/2023. CHF, CAD, Arrythmias:Atrial Fibrillation and                 Bradycardia; Risk Factors:Hypertension and Dyslipidemia.  Sonographer:    Celesta Gentile RCS Referring Phys: [id number] [facility] IMPRESSIONS  1. Left ventricular ejection fraction, by estimation, is 35 to 40%. The left ventricle has moderately decreased function. The left ventricle demonstrates global hypokinesis. Left ventricular diastolic parameters are consistent with Grade III diastolic dysfunction (restrictive). Elevated left atrial pressure. There is the interventricular septum is flattened in systole, consistent with right ventricular pressure overload.  2. Right ventricular systolic function is mildly reduced. The right ventricular size is mild to moderately enlarged. There is moderately elevated pulmonary artery systolic pressure.  3. Left atrial size was severely dilated.  4. Right atrial size was severely dilated.  5. The mitral valve is abnormal. Moderate mitral valve regurgitation. No evidence of mitral stenosis.  6. The tricuspid valve is abnormal. Tricuspid valve regurgitation is mild to moderate.  7. The aortic valve is tricuspid. Aortic valve regurgitation is mild. No aortic stenosis is present.  8. The inferior vena cava is dilated in size with <50% respiratory variability, suggesting right  atrial pressure of 15 mmHg. FINDINGS  Left Ventricle: Left ventricular ejection fraction, by estimation, is 35 to 40%. The left ventricle has moderately decreased function. The left ventricle demonstrates global hypokinesis. The left ventricular internal cavity size was normal in size. There is no left ventricular hypertrophy. The interventricular septum is flattened in systole, consistent with right ventricular pressure overload. Left ventricular diastolic parameters are consistent with Grade III diastolic dysfunction (restrictive). Elevated left atrial pressure. Right Ventricle: The right ventricular size is mild to moderately enlarged. Right vetricular wall thickness was not well visualized. Right ventricular systolic function is mildly reduced. There is moderately elevated pulmonary artery systolic pressure. The tricuspid regurgitant velocity is 2.96 m/s, and with an assumed right atrial pressure of 15 mmHg, the estimated right ventricular systolic pressure is 50.0 mmHg. Left Atrium: Left atrial size was severely dilated. Right Atrium: Right atrial size was severely dilated. Pericardium: There is no evidence of pericardial  Physician Discharge Summary   Patient: Kevin Collins MRN: [id number] DOB: [birth year]  Admit date:     09/09/2023  Discharge date: 09/14/23  Discharge Physician: Vassie Loll   PCP: Toma Deiters, MD   Recommendations at discharge:  Repeat complete metabolic panel to follow electrolytes, LFTs and renal function trend/stability. Reassess patient volume status and further adjust diuretics as/antihypertensive regimen -Patient to follow-up with cardiology service for further GDMT adjustments. Arrange outpatient follow-up with gastroenterology service to follow normal   Discharge Diagnoses: Principal Problem:   CHF (congestive heart failure) (HCC) Active Problems:   Dyslipidemia   Paroxysmal atrial fibrillation (HCC)   Essential hypertension   Peripheral neuropathy   Transaminitis   Bacteremia   AKI (acute kidney injury) (HCC)  Brief Narrative:   Kevin Collins is a 89 y.o. male with medical history significant of coronary artery disease, hyperlipidemia, hypertension, peripheral neuropathy, recent admission for bacteremia, pacemaker in place, and more presents the ED with a chief complaint of orthopnea.  Patient reports that he wakes up in the middle of the night feeling dizzy and like he might pass out and very short of breath.  He reports that this feeling can last throughout the day until 430 or 5 PM.  He reports it has been going on since his Lasix was discontinued at his last hospitalization.  Patient has been admitted with acute HFrEF and continues to have slow improvement with ongoing diuresis.  Assessment and Plan: Acute HFrEF - Appreciate cardiology recommendations -2D echocardiogram with EF 35-40% noted - Stable and with breathing back to baseline -Will go home with adjusted dose of diuretics as per cardiology recommendations. -No oxygen supplementation required -Excellent response in his overall breathing after Right-sided thoracentesis performed 9/27 with 700 mL fluid output  removed.   -Continue low-sodium diet and daily weights.   Paroxysmal atrial fibrillation (HCC) - Continue Eliquis -No overt bleeding appreciated. -Repeat CBC to follow hemoglobin trend.   Dyslipidemia - Holding Zocor in the setting of transaminitis -At discharge planning for patient to start Lipitor 40 mg daily -Continue heart healthy diet.   AKI (acute kidney injury) (HCC)-improving -Likely related to cardiorenal syndrome -Continue to hold nephrotoxic agents, avoid the use of contrast and hypotension. -Continue diuresis -Follow renal function trend -Creatinine trending down/stabilizing.   Hypokalemia/Hypomagnesemia -Stable after repletion -Continue to follow electrolytes trend. -Daily supplementation recommended at discharge (especially now that he will be taking diuretics.   Bacteremia - At last hospitalization was diagnosed with Enterococcus faecalis bacteremia - Was discharged on 6 weeks of ampicillin and Rocephin - Continue ampicillin and Rocephin - Given that he did have bacteremia recently and now that he is in acute heart failure, will repeat echo even though 1 was just done earlier this month to evaluate valves, signs for endocarditis - Continue to monitor -Will require treatment through 10/17. -Afebrile.   Transaminitis- - Upward trend in the setting of hepatic congestion from acute on chronic heart failure -Continue treatment with diuretics as per cardiology recommendation -Heart healthy diet recommended -LFTs trending down adequately; overall condition improving/resolving -Continue to follow LFTs -At discharge adjusted dose of his statin has been recommended and started. -Right upper quadrant ultrasound with suggested underlying cirrhotic changes; outpatient follow-up with GI recommended.   Peripheral neuropathy - Continue gabapentin at a reduced dose  -Continue to follow clinical response.   Essential hypertension - Currently controlled -Continue heart  healthy/low-sodium diet -Continue to follow vital signs. -Continue adjusted diuretics and initiation of losartan as per cardiology recommendation.  Physician Discharge Summary   Patient: Kevin Collins MRN: [id number] DOB: [birth year]  Admit date:     09/09/2023  Discharge date: 09/14/23  Discharge Physician: Vassie Loll   PCP: Toma Deiters, MD   Recommendations at discharge:  Repeat complete metabolic panel to follow electrolytes, LFTs and renal function trend/stability. Reassess patient volume status and further adjust diuretics as/antihypertensive regimen -Patient to follow-up with cardiology service for further GDMT adjustments. Arrange outpatient follow-up with gastroenterology service to follow normal   Discharge Diagnoses: Principal Problem:   CHF (congestive heart failure) (HCC) Active Problems:   Dyslipidemia   Paroxysmal atrial fibrillation (HCC)   Essential hypertension   Peripheral neuropathy   Transaminitis   Bacteremia   AKI (acute kidney injury) (HCC)  Brief Narrative:   Kevin Collins is a 89 y.o. male with medical history significant of coronary artery disease, hyperlipidemia, hypertension, peripheral neuropathy, recent admission for bacteremia, pacemaker in place, and more presents the ED with a chief complaint of orthopnea.  Patient reports that he wakes up in the middle of the night feeling dizzy and like he might pass out and very short of breath.  He reports that this feeling can last throughout the day until 430 or 5 PM.  He reports it has been going on since his Lasix was discontinued at his last hospitalization.  Patient has been admitted with acute HFrEF and continues to have slow improvement with ongoing diuresis.  Assessment and Plan: Acute HFrEF - Appreciate cardiology recommendations -2D echocardiogram with EF 35-40% noted - Stable and with breathing back to baseline -Will go home with adjusted dose of diuretics as per cardiology recommendations. -No oxygen supplementation required -Excellent response in his overall breathing after Right-sided thoracentesis performed 9/27 with 700 mL fluid output  removed.   -Continue low-sodium diet and daily weights.   Paroxysmal atrial fibrillation (HCC) - Continue Eliquis -No overt bleeding appreciated. -Repeat CBC to follow hemoglobin trend.   Dyslipidemia - Holding Zocor in the setting of transaminitis -At discharge planning for patient to start Lipitor 40 mg daily -Continue heart healthy diet.   AKI (acute kidney injury) (HCC)-improving -Likely related to cardiorenal syndrome -Continue to hold nephrotoxic agents, avoid the use of contrast and hypotension. -Continue diuresis -Follow renal function trend -Creatinine trending down/stabilizing.   Hypokalemia/Hypomagnesemia -Stable after repletion -Continue to follow electrolytes trend. -Daily supplementation recommended at discharge (especially now that he will be taking diuretics.   Bacteremia - At last hospitalization was diagnosed with Enterococcus faecalis bacteremia - Was discharged on 6 weeks of ampicillin and Rocephin - Continue ampicillin and Rocephin - Given that he did have bacteremia recently and now that he is in acute heart failure, will repeat echo even though 1 was just done earlier this month to evaluate valves, signs for endocarditis - Continue to monitor -Will require treatment through 10/17. -Afebrile.   Transaminitis- - Upward trend in the setting of hepatic congestion from acute on chronic heart failure -Continue treatment with diuretics as per cardiology recommendation -Heart healthy diet recommended -LFTs trending down adequately; overall condition improving/resolving -Continue to follow LFTs -At discharge adjusted dose of his statin has been recommended and started. -Right upper quadrant ultrasound with suggested underlying cirrhotic changes; outpatient follow-up with GI recommended.   Peripheral neuropathy - Continue gabapentin at a reduced dose  -Continue to follow clinical response.   Essential hypertension - Currently controlled -Continue heart  healthy/low-sodium diet -Continue to follow vital signs. -Continue adjusted diuretics and initiation of losartan as per cardiology recommendation.

## 2023-09-14 NOTE — Progress Notes (Signed)
Per MD request, arranged 1 week f/u with FYI to get BMET in appt notes - scheduled Monday Sep 21, 2023 2:30 PM. Appt info, including location, outlined on AVS. Patient also notified of appt location.

## 2023-09-14 NOTE — Care Management Important Message (Signed)
Important Message  Patient Details  Name: Kevin Collins MRN: [id number] Date of Birth: 1930-12-06   Important Message Given:  Yes - Medicare IM     Corey Harold 09/14/2023, 11:22 AM

## 2023-09-14 NOTE — TOC Transition Note (Signed)
Transition of Care [facility]) - CM/SW Discharge Note   Patient Details  Name: Kevin Collins MRN: [id number] Date of Birth: [birth year]  Transition of Care [facility]) CM/SW Contact:  Villa Herb, LCSWA Phone Number: 09/14/2023, 1:24 PM   Clinical Narrative:    CSW spoke to [facility] with [employer] who confirms they have everything needed for pt to return home with IV antibiotics. CSW updated Kandee Keen with [facility] to update on D/C, orders in place. RN updated that [facility] and home IV company ready for D/C. TOC signing off.   Final next level of care: Home w Home Health Services Barriers to Discharge: Barriers Resolved   Patient Goals and CMS Choice      Discharge Placement                         Discharge Plan and Services Additional resources added to the After Visit Summary for   In-house Referral: Clinical Social Work                        HH Arranged: RN HH Agency: [facility] Date [facility] Agency Contacted: 09/10/23 Time HH Agency Contacted: [id number] Representative spoke with at [facility] Agency: Kandee Keen and Pam  Social Determinants of Health (SDOH) Interventions SDOH Screenings   Food Insecurity: No Food Insecurity (09/11/2023)  Housing: Low Risk  (09/11/2023)  Transportation Needs: No Transportation Needs (09/11/2023)  Utilities: Not At Risk (09/11/2023)  Tobacco Use: Medium Risk (09/09/2023)     Readmission Risk Interventions    08/21/2023    2:54 PM  Readmission Risk Prevention Plan  Transportation Screening Complete  PCP or Specialist Appt within 5-7 Days Complete  Home Care Screening Complete  Medication Review (RN CM) Complete

## 2023-09-14 NOTE — Progress Notes (Signed)
Progress Note  Patient Name: Kevin Collins Date of Encounter: 09/14/2023  Primary Cardiologist: Rollene Rotunda, MD  Interval Summary   Feels much better in terms of leg swelling.  No chest pain or shortness of breath at rest.  Vital Signs    Vitals:   09/13/23 1955 09/14/23 0411 09/14/23 0456 09/14/23 0936  BP: (!) 118/54 124/73  123/62  Pulse: 61 65  64  Resp: 17 16    Temp: 97.8 F (36.6 C) 97.7 F (36.5 C)  98 F (36.7 C)  TempSrc: Oral Oral  Oral  SpO2: 97% 96%  95%  Weight:   69 kg   Height:        Intake/Output Summary (Last 24 hours) at 09/14/2023 0955 Last data filed at 09/14/2023 0456 Gross per 24 hour  Intake 240 ml  Output 4975 ml  Net -4735 ml   Filed Weights   09/12/23 0500 09/13/23 0312 09/14/23 0456  Weight: 72.9 kg 70.6 kg 69 kg    Physical Exam   GEN: No acute distress.   Neck: No JVD. Cardiac: RRR, 2/6 systolic murmur, no gallop.  Respiratory: Nonlabored.  Decreased basilar breath sounds. GI: Soft, nontender, bowel sounds present. MS: No edema. Neuro:  Nonfocal. Psych: Alert and oriented x 3. Normal affect.  ECG/Telemetry    Telemetry reviewed showing sinus rhythm.  Labs    Chemistry Recent Labs  Lab 09/11/23 0416 09/12/23 0409 09/13/23 0412  NA 138 137 136  K 3.4* 3.3* 3.3*  CL 94* 92* 92*  CO2 28 30 31   GLUCOSE 95 89 91  BUN 19 21 20   CREATININE 1.48* 1.39* 1.26*  CALCIUM 8.0* 7.9* 7.8*  PROT 5.8* 5.7* 5.4*  ALBUMIN 3.2* 3.2* 3.0*  AST 264* 161* 114*  ALT 280* 228* 189*  ALKPHOS 51 50 43  BILITOT 0.8 0.6 0.5  GFRNONAA 44* 48* 54*  ANIONGAP 16* 15 13    Hematology Recent Labs  Lab 09/11/23 0416 09/12/23 0409 09/13/23 0412  WBC 10.6* 9.8 7.1  RBC 3.55* 3.41* 3.19*  HGB 10.7* 10.1* 9.9*  HCT 34.8* 33.4* 31.1*  MCV 98.0 97.9 97.5  MCH 30.1 29.6 31.0  MCHC 30.7 30.2 31.8  RDW 14.9 15.2 15.0  PLT 166 158 141*   Cardiac Enzymes Recent Labs  Lab 09/09/23 1849 09/10/23 0412  TROPONINIHS 30* 53*     Cardiac Studies   Echocardiogram 09/10/2023:  1. Left ventricular ejection fraction, by estimation, is 35 to 40%. The  left ventricle has moderately decreased function. The left ventricle  demonstrates global hypokinesis. Left ventricular diastolic parameters are  consistent with Grade III diastolic  dysfunction (restrictive). Elevated left atrial pressure. There is the  interventricular septum is flattened in systole, consistent with right  ventricular pressure overload.   2. Right ventricular systolic function is mildly reduced. The right  ventricular size is mild to moderately enlarged. There is moderately  elevated pulmonary artery systolic pressure.   3. Left atrial size was severely dilated.   4. Right atrial size was severely dilated.   5. The mitral valve is abnormal. Moderate mitral valve regurgitation. No  evidence of mitral stenosis.   6. The tricuspid valve is abnormal. Tricuspid valve regurgitation is mild  to moderate.   7. The aortic valve is tricuspid. Aortic valve regurgitation is mild. No  aortic stenosis is present.   8. The inferior vena cava is dilated in size with <50% respiratory  variability, suggesting right atrial pressure of 15 mmHg.   Assessment &  Plan   1.  Acute on chronic HFrEF, LVEF 35 to 40% by recent echocardiogram.  Has restrictive diastolic filling pattern and mild RV dysfunction as well.  Symptomatically improved with IV diuresis and creatinine has been relatively stable.  2.  Multivessel CAD status post CABG in 2000 with LIMA to LAD, SVG to OM, SVG to diagonal, and SVG to PDA/PL.  He has CRT-P in place.  Not on aspirin given use of Eliquis.  Also on Zetia.  No recent angina.  3.  Persistent atrial fibrillation, on Eliquis for stroke prophylaxis.  4.  CKD stage IIIb, recent creatinine down to 1.26.  Chart reviewed.  Would plan to discontinue IV Lasix today.  Continue Eliquis, Zetia, losartan, and Aldactone.  Hold off on beta-blocker for now.   Start oral Lasix 40 mg daily.  Recommend outpatient follow-up in 7 to 10 days, typically sees Dr. Antoine Poche, can see APP if necessary.  Needs BMET around the time of his next visit.  We will sign off.  For questions or updates, please contact [facility] Please consult [email or web address] for contact info under   Signed, Nona Dell, MD  09/14/2023, 9:55 AM

## 2023-09-14 NOTE — Progress Notes (Signed)
Mobility Specialist Progress Note:    09/14/23 1120  Mobility  Activity Ambulated with assistance in hallway  Level of Assistance Standby assist, set-up cues, supervision of patient - no hands on  Assistive Device None  Distance Ambulated (ft) 140 ft  Range of Motion/Exercises Active;All extremities  Activity Response Tolerated well  Mobility Referral Yes  $Mobility charge 1 Mobility  Mobility Specialist Start Time (ACUTE ONLY) 1120  Mobility Specialist Stop Time (ACUTE ONLY) 1130  Mobility Specialist Time Calculation (min) (ACUTE ONLY) 10 min   Pt received in bed, agreeable to mobility. Required SBA to stand and ambulate with no AD. Tolerated well, asx throughout. Returned pt to room, all needs met.   Lawerance Bach Mobility Specialist Please contact via Special educational needs teacher or  Rehab office at [phone]

## 2023-09-14 NOTE — Plan of Care (Signed)
  Problem: Fluid Volume: Goal: Hemodynamic stability will improve Outcome: Adequate for Discharge   Problem: Clinical Measurements: Goal: Diagnostic test results will improve Outcome: Adequate for Discharge Goal: Signs and symptoms of infection will decrease Outcome: Adequate for Discharge   Problem: Respiratory: Goal: Ability to maintain adequate ventilation will improve Outcome: Adequate for Discharge   Problem: Education: Goal: Knowledge of General Education information will improve Description: Including pain rating scale, medication(s)/side effects and non-pharmacologic comfort measures Outcome: Adequate for Discharge   Problem: Health Behavior/Discharge Planning: Goal: Ability to manage health-related needs will improve Outcome: Adequate for Discharge   Problem: Clinical Measurements: Goal: Ability to maintain clinical measurements within normal limits will improve Outcome: Adequate for Discharge Goal: Will remain free from infection Outcome: Adequate for Discharge Goal: Diagnostic test results will improve Outcome: Adequate for Discharge Goal: Respiratory complications will improve Outcome: Adequate for Discharge Goal: Cardiovascular complication will be avoided Outcome: Adequate for Discharge   Problem: Activity: Goal: Risk for activity intolerance will decrease Outcome: Adequate for Discharge   Problem: Nutrition: Goal: Adequate nutrition will be maintained Outcome: Adequate for Discharge   Problem: Coping: Goal: Level of anxiety will decrease Outcome: Adequate for Discharge   Problem: Elimination: Goal: Will not experience complications related to bowel motility Outcome: Adequate for Discharge Goal: Will not experience complications related to urinary retention Outcome: Adequate for Discharge   Problem: Pain Managment: Goal: General experience of comfort will improve Outcome: Adequate for Discharge   Problem: Safety: Goal: Ability to remain free  from injury will improve Outcome: Adequate for Discharge   Problem: Skin Integrity: Goal: Risk for impaired skin integrity will decrease Outcome: Adequate for Discharge   Problem: Education: Goal: Ability to demonstrate management of disease process will improve Outcome: Adequate for Discharge Goal: Ability to verbalize understanding of medication therapies will improve Outcome: Adequate for Discharge Goal: Individualized Educational Video(s) Outcome: Adequate for Discharge   Problem: Activity: Goal: Capacity to carry out activities will improve Outcome: Adequate for Discharge   Problem: Cardiac: Goal: Ability to achieve and maintain adequate cardiopulmonary perfusion will improve Outcome: Adequate for Discharge

## 2023-09-15 ENCOUNTER — Other Ambulatory Visit: Payer: Self-pay | Admitting: *Deleted

## 2023-09-15 DIAGNOSIS — D649 Anemia, unspecified: Secondary | ICD-10-CM | POA: Diagnosis not present

## 2023-09-15 DIAGNOSIS — I5023 Acute on chronic systolic (congestive) heart failure: Secondary | ICD-10-CM

## 2023-09-15 DIAGNOSIS — H9190 Unspecified hearing loss, unspecified ear: Secondary | ICD-10-CM | POA: Diagnosis not present

## 2023-09-15 DIAGNOSIS — I38 Endocarditis, valve unspecified: Secondary | ICD-10-CM | POA: Diagnosis not present

## 2023-09-15 DIAGNOSIS — I088 Other rheumatic multiple valve diseases: Secondary | ICD-10-CM | POA: Diagnosis not present

## 2023-09-15 DIAGNOSIS — I119 Hypertensive heart disease without heart failure: Secondary | ICD-10-CM | POA: Diagnosis not present

## 2023-09-15 DIAGNOSIS — I480 Paroxysmal atrial fibrillation: Secondary | ICD-10-CM | POA: Diagnosis not present

## 2023-09-15 DIAGNOSIS — G609 Hereditary and idiopathic neuropathy, unspecified: Secondary | ICD-10-CM | POA: Diagnosis not present

## 2023-09-15 DIAGNOSIS — E871 Hypo-osmolality and hyponatremia: Secondary | ICD-10-CM | POA: Diagnosis not present

## 2023-09-15 DIAGNOSIS — A419 Sepsis, unspecified organism: Secondary | ICD-10-CM | POA: Diagnosis not present

## 2023-09-15 DIAGNOSIS — I2510 Atherosclerotic heart disease of native coronary artery without angina pectoris: Secondary | ICD-10-CM | POA: Diagnosis not present

## 2023-09-15 NOTE — Consult Note (Signed)
[facility] Customer service manager [facility]) Accountable Care Organization (ACO) [facility] Liaison Note  09/15/2023  Kevin Collins [birth year] [id number]  Location: [facility] RN Hospital Liaison screened the patient remotely at [facility].  Insurance: [employer]   [name] is a 89 y.o. male who is a Primary Care Patient of Hasanaj, Myra Gianotti, MD- [facility]. The patient was screened for 30 day readmission hospitalization with noted medium risk score for unplanned readmission risk with 2 IP/1 ED in 6 months.  The patient was assessed for potential [facility]) Care Management service needs for post hospital transition for care coordination. Review of patient's electronic medical record reveals patient was admitted for Congestive Heart Failure. Pt has discharged from the facility.  Liaison called and spoke with the pt's son Casimiro Needle and introduced community care management services and offered a post hospital prevention readmission follow up call from a nurse care coordinator (receptive). Will make to appropriate referral for RNCM to intervene.   Plan: [facility] Liaison will continue to follow progress and disposition to asess for post hospital community care coordination/management needs.  Referral request for community care coordination: Will make a referral for community care management services.   [facility] Care Management/Population Health does not replace or interfere with any arrangements made by the Inpatient Transition of Care team.   For questions contact:   Elliot Cousin, RN, [facility] Liaison [place]   Population Health Office Hours MTWF  8:00 am-6:00 pm [phone] mobile [phone] [Office toll free line] Office Hours are M-F 8:30 - 5 pm [email or web address]

## 2023-09-17 ENCOUNTER — Telehealth: Payer: Self-pay | Admitting: *Deleted

## 2023-09-17 DIAGNOSIS — I38 Endocarditis, valve unspecified: Secondary | ICD-10-CM | POA: Diagnosis not present

## 2023-09-17 NOTE — Progress Notes (Signed)
Care Coordination   Note   09/17/2023 Name: [name] MRN: [id number] DOB: [birth year]  Yogi Colla is a 89 y.o. year old male who sees Hasanaj, Myra Gianotti, MD for primary care. I reached out to [name] by phone today to offer care coordination services.  Mr. Madriaga was given information about Care Coordination services today including:   The Care Coordination services include support from the care team which includes your Nurse Coordinator, Clinical Social Worker, or Pharmacist.  The Care Coordination team is here to help remove barriers to the health concerns and goals most important to you. Care Coordination services are voluntary, and the patient may decline or stop services at any time by request to their care team member.   Care Coordination Consent Status: Patient agreed to services and verbal consent obtained.   Follow up plan:  Telephone appointment with care coordination team member scheduled for:  10/22  Encounter Outcome:  Patient Scheduled   [facility] Coordination Care Guide  Direct Dial: [phone]

## 2023-09-19 DIAGNOSIS — I38 Endocarditis, valve unspecified: Secondary | ICD-10-CM | POA: Diagnosis not present

## 2023-09-21 ENCOUNTER — Encounter: Payer: Self-pay | Admitting: Nurse Practitioner

## 2023-09-21 ENCOUNTER — Other Ambulatory Visit (HOSPITAL_COMMUNITY)
Admission: RE | Admit: 2023-09-21 | Discharge: 2023-09-21 | Disposition: A | Discharge status: Home / Self Care | Payer: Medicare PPO | Source: Ambulatory Visit | Attending: Nurse Practitioner | Admitting: Nurse Practitioner

## 2023-09-21 ENCOUNTER — Ambulatory Visit: Payer: Medicare PPO | Attending: Nurse Practitioner | Admitting: Nurse Practitioner

## 2023-09-21 VITALS — BP 90/46 | HR 63 | Ht 70.0 in | Wt 151.8 lb

## 2023-09-21 DIAGNOSIS — I2510 Atherosclerotic heart disease of native coronary artery without angina pectoris: Secondary | ICD-10-CM

## 2023-09-21 DIAGNOSIS — I10 Essential (primary) hypertension: Secondary | ICD-10-CM

## 2023-09-21 DIAGNOSIS — I4819 Other persistent atrial fibrillation: Secondary | ICD-10-CM | POA: Diagnosis not present

## 2023-09-21 DIAGNOSIS — I255 Ischemic cardiomyopathy: Secondary | ICD-10-CM | POA: Diagnosis not present

## 2023-09-21 DIAGNOSIS — E785 Hyperlipidemia, unspecified: Secondary | ICD-10-CM

## 2023-09-21 DIAGNOSIS — I5022 Chronic systolic (congestive) heart failure: Secondary | ICD-10-CM

## 2023-09-21 DIAGNOSIS — I5042 Chronic combined systolic (congestive) and diastolic (congestive) heart failure: Secondary | ICD-10-CM | POA: Insufficient documentation

## 2023-09-21 LAB — CBC
HCT: 31.7 % — ABNORMAL LOW (ref 39.0–52.0)
Hemoglobin: 9.5 g/dL — ABNORMAL LOW (ref 13.0–17.0)
MCH: 29.7 pg (ref 26.0–34.0)
MCHC: 30 g/dL (ref 30.0–36.0)
MCV: 99.1 fL (ref 80.0–100.0)
Platelets: 157 10*3/uL (ref 150–400)
RBC: 3.2 MIL/uL — ABNORMAL LOW (ref 4.22–5.81)
RDW: 13.9 % (ref 11.5–15.5)
WBC: 8.5 10*3/uL (ref 4.0–10.5)
nRBC: 0 % (ref 0.0–0.2)

## 2023-09-21 LAB — BASIC METABOLIC PANEL
Anion gap: 11 (ref 5–15)
BUN: 28 mg/dL — ABNORMAL HIGH (ref 8–23)
CO2: 27 mmol/L (ref 22–32)
Calcium: 8.7 mg/dL — ABNORMAL LOW (ref 8.9–10.3)
Chloride: 97 mmol/L — ABNORMAL LOW (ref 98–111)
Creatinine, Ser: 1.6 mg/dL — ABNORMAL HIGH (ref 0.61–1.24)
GFR, Estimated: 40 mL/min — ABNORMAL LOW (ref >60)
Glucose, Bld: 92 mg/dL (ref 70–99)
Potassium: 4.6 mmol/L (ref 3.5–5.1)
Sodium: 135 mmol/L (ref 135–145)

## 2023-09-21 NOTE — Patient Instructions (Signed)
Medication Instructions:  Your physician recommends that you continue on your current medications as directed. Please refer to the Current Medication list given to you today.  *If you need a refill on your cardiac medications before your next appointment, please call your pharmacy*   Lab Work: BMET CBC  If you have labs (blood work) drawn today and your tests are completely normal, you will receive your results only by: MyChart Message (if you have MyChart) OR A paper copy in the mail If you have any lab test that is abnormal or we need to change your treatment, we will call you to review the results.   Testing/Procedures: None   Follow-Up: At [facility], you and your health needs are our priority.  As part of our continuing mission to provide you with exceptional heart care, we have created designated Provider Care Teams.  These Care Teams include your primary Cardiologist (physician) and Advanced Practice Providers (APPs -  Physician Assistants and Nurse Practitioners) who all work together to provide you with the care you need, when you need it.  We recommend signing up for the patient portal called "MyChart".  Sign up information is provided on this After Visit Summary.  MyChart is used to connect with patients for Virtual Visits (Telemedicine).  Patients are able to view lab/test results, encounter notes, upcoming appointments, etc.  Non-urgent messages can be sent to your provider as well.   To learn more about what you can do with MyChart, go to [email or web address].    Your next appointment:   1 month(s)  Provider:   You may see Rollene Rotunda, MD or one of the following Advanced Practice Providers on your designated Care Team:   Randall An, PA-C  Jacolyn Reedy, [place]     Other Instructions

## 2023-09-21 NOTE — Progress Notes (Signed)
Office Visit    Patient Name: Kevin Collins Date of Encounter: 09/21/2023  Primary Care Provider:  Toma Deiters, MD Primary Cardiologist:  Rollene Rotunda, MD  Chief Complaint    89 y.o. male with a hx of CAD (s/p CABG in 10/1999 with LIMA-LAD, SVG-OM, SVG-D1 and SVG-PDA/PL), persistent atrial fibrillation, HTN, HLD, Stage 3 CKD, chronic HFrEF, and symptomatic bradycardia (s/p CRT-P BiV in 01/2022)   Past Medical History    Past Medical History:  Diagnosis Date   Bacteremia 08/2023   Bradycardia, unspecified    CAD (coronary artery disease)    a. 2000 s/p CABG x 4 (LIMA->LAD, VG->OM, VG->Diag, VG->RPDA/RPL.   Carotid arterial disease (HCC)    a. 12/2019 U/S: RICA 1-39% bilat.   Chronic HFrEF (heart failure with reduced ejection fraction) (HCC)    a. 08/2023 Echo: EF 35-40%, glob HK, GrIII DD, mildly reduced RV fxn, sev BAE, mod MR, mild-mod TR.   CKD (chronic kidney disease), stage III (HCC)    Hereditary and idiopathic neuropathy, unspecified    History of shingles    Hyperlipidemia    Hypertension    ESSENTIAL PRIMARY   NICM (nonischemic cardiomyopathy) (HCC)    a. 01/2022 s/p SJM 3562, quadra Allure MP CRT-P (LV/RV leads, no RA); b. 08/2023 Echo: EF 35-40%.   Peripheral neuropathy    Persistent atrial fibrillation (HCC)    a. CHA2DS2VASc = 5-->eliquis.   Past Surgical History:  Procedure Laterality Date   BIV PACEMAKER INSERTION CRT-P N/A 01/24/2022   Procedure: BIV PACEMAKER INSERTION CRT-P;  Surgeon: Lanier Prude, MD;  Location: [facility] INVASIVE CV LAB;  Service: Cardiovascular;  Laterality: N/A;   CARDIAC CATHETERIZATION Left 09/1999   NORMAL LEFT MAIN, OCCLUDED LAD, OCCLUDED MID CFX, OCCLUDED PROXIMAL RCA, 60% STENOSIS PROXIMAL DIAG 1, RIGHT TO LEFT COLLATERAL, LEFT TO RIGHT COLLATERAL; LVEF OF 48% DOCUMENTED VIA NUCLEAR STUDY ON 07/12/2009.   CARDIOVERSION N/A 10/05/2019   Procedure: CARDIOVERSION;  Surgeon: Thurmon Fair, MD;  Location: [facility] ENDOSCOPY;  Service:  Cardiovascular;  Laterality: N/A;   CATARACT EXTRACTION     CORONARY ARTERY BYPASS GRAFT     w/ LIMA TO LAD, SVG TO dx, SVG TO OM, SVG TO AM-PD-PL 10/12/99 [place]   REMOVAL OF ANKLE PLATE      Allergies  No Known Allergies  History of Present Illness      89 y.o. y/o male with a hx of CAD (s/p CABG in 10/1999 with LIMA-LAD, SVG-OM, SVG-D1 and SVG-PDA/PL), persistent atrial fibrillation, HTN, HLD, Stage 3 CKD, chronic HFrEF, and symptomatic bradycardia (s/p CRT-P BiV in 01/2022).  He was admitted to [facility] in early September with Enterococcus faecalis bacteremia felt to be secondary to left foot cellulitis and possible osteomyelitis.  Echo during that time showed an EF of 50 to 55%.  Discussed with electrophysiology and decision was made to continue conservative management with antibiotics given advanced age, as opposed to TEE and device removal.  He was readmitted in mid September with worsening lower extremity edema but left AMA and then return to the emergency department on September 25 with volume overload and LFT abnormalities (AST 311, ALT 205).  Troponins were minimally elevated with flat trend.  He was aggressively diuresed with good response.  LFTs improved with diuresis.  Repeat echo showed an EF of 35 to 40% with grade 3 diastolic dysfunction.  He was discharged home on Lasix 40 mg daily in addition to losartan and spironolactone.   Since discharge, he has felt  well.  He has been having some pain in his feet related to his gabapentin dose being reduced.  His weight has been stable at home and he has not been experiencing any significant dyspnea on exertion.  He denies chest pain, palpitations, PND, orthopnea, dizziness, syncope, edema, or early satiety.  Home Medications    Current Outpatient Medications  Medication Sig Dispense Refill   ampicillin IVPB Inject 8 g into the vein daily for 16 days. As a continuous infusion Indication:  Endocarditis First Dose: Yes Last Day of  Therapy:  09/30/2023 Labs - Once weekly:  CBC/D and BMP, Labs - Once weekly: ESR and CRP Method of administration: Ambulatory Pump (Continuous Infusion) Method of administration may be changed at the discretion of home infusion pharmacist based upon assessment of the patient and/or caregiver's ability to self-administer the medication ordered. 16 Units 0   atorvastatin (LIPITOR) 40 MG tablet Take 1 tablet (40 mg total) by mouth daily. 30 tablet 11   bimatoprost (LUMIGAN) 0.01 % SOLN Place 1 drop into both eyes at bedtime.     cefTRIAXone (ROCEPHIN) IVPB Inject 2 g into the vein every 12 (twelve) hours for 16 days. Indication:  Endocarditis First Dose: Yes Last Day of Therapy:  09/30/2023 Labs - Once weekly:  CBC/D and BMP, Labs - Once weekly: ESR and CRP Method of administration: IV Push Method of administration may be changed at the discretion of home infusion pharmacist based upon assessment of the patient and/or caregiver's ability to self-administer the medication ordered. 32 Units 0   cinacalcet (SENSIPAR) 90 MG tablet Take 90 mg by mouth daily.     ELIQUIS 2.5 MG TABS tablet TAKE 1 TABLET TWICE A DAY 180 tablet 3   ezetimibe (ZETIA) 10 MG tablet TAKE 1 TABLET DAILY (NEED APPOINTMENT FOR FUTURE REFILLS) 30 tablet 0   furosemide (LASIX) 40 MG tablet Take 1 tablet (40 mg total) by mouth daily. 30 tablet 2   gabapentin (NEURONTIN) 300 MG capsule Take 1 capsule (300 mg total) by mouth 3 (three) times daily. 90 capsule 2   losartan (COZAAR) 25 MG tablet Take 0.5 tablets (12.5 mg total) by mouth daily. 30 tablet 1   Multiple Vitamins-Minerals (PRESERVISION AREDS) CAPS Take 1 capsule by mouth 2 (two) times daily.     oxyCODONE-acetaminophen (PERCOCET) 7.5-325 MG tablet Take 1 tablet by mouth every 8 (eight) hours as needed for severe pain.     potassium chloride SA (KLOR-CON M) 20 MEQ tablet Take 1 tablet (20 mEq total) by mouth daily. 20 tablet 0   spironolactone (ALDACTONE) 25 MG tablet Take  0.5 tablets (12.5 mg total) by mouth daily. 30 tablet 1   No current facility-administered medications for this visit.     Review of Systems    Bilateral foot pain in the setting of neuropathy.  He denies chest pain, palpitations, dyspnea, pnd, orthopnea, n, v, dizziness, syncope, edema, weight gain, or early satiety.  All other systems reviewed and are otherwise negative except as noted above.    Physical Exam    VS:  BP (!) 90/46 (BP Location: Left Arm, Patient Position: Sitting, Cuff Size: Normal)   Pulse 63   Ht 5\' 10"  (1.778 m)   Wt 151 lb 12.8 oz (68.9 kg)   SpO2 96%   BMI 21.78 kg/m  , BMI Body mass index is 21.78 kg/m.     GEN: Well nourished, well developed, in no acute distress. HEENT: normal. Neck: Supple, no JVD, carotid bruits, or masses.  Cardiac: RRR, 2/6 systolic murmur heard throughout, loudest at the apex. No clubbing, cyanosis, 1+ bilateral ankle edema above the sock line.  Radials 2+/PT 2+ and equal bilaterally.  Respiratory:  Respirations regular and unlabored, clear to auscultation bilaterally. GI: Soft, nontender, nondistended, BS + x 4. MS: no deformity or atrophy. Skin: warm and dry, no rash. Neuro:  Strength and sensation are intact. Psych: Normal affect.  Accessory Clinical Findings    Lab Results  Component Value Date   WBC 8.5 09/21/2023   HGB 9.5 (L) 09/21/2023   HCT 31.7 (L) 09/21/2023   MCV 99.1 09/21/2023   PLT 157 09/21/2023   Lab Results  Component Value Date   CREATININE 1.60 (H) 09/21/2023   BUN 28 (H) 09/21/2023   NA 135 09/21/2023   K 4.6 09/21/2023   CL 97 (L) 09/21/2023   CO2 27 09/21/2023   Lab Results  Component Value Date   ALT 189 (H) 09/13/2023   AST 114 (H) 09/13/2023   ALKPHOS 43 09/13/2023   BILITOT 0.5 09/13/2023   Assessment & Plan    1.  Chronic heart failure with reduced ejection fraction/ischemic cardiomyopathy: EF 35 to 40% by recent echo in the setting of admission for volume overload.  He has done well  since hospital discharge and is euvolemic on examination today.  We did obtain lab work today showing stable H&H with slight rise in BUN and creatinine to 28/1.60 respectively.  Will plan to reduce Lasix down to 20 mg daily and 20 mg as needed for weight gain.  Continue ARB and spironolactone.  Beta-blocker was discontinued during hospitalization in the setting of relative hypotension which persists.  2.  Coronary artery disease: Status post remote CABG in 2000.  Minimal troponin elevation in the setting of admission for heart failure recently.  He has not been having any chest pain.  Continue conservative therapy with Zetia and ARB.  No aspirin in the setting of Eliquis.  Beta-blocker recently discontinued due to soft blood pressures.  3.  Persistent atrial fibrillation: V paced with underlying A-fib during hospitalization in September.  Regular on examination today.  He remains anticoagulated with Eliquis with stable H&H.  4.  Primary hypertension: Blood pressures remain soft but stable.  5.  Hyperlipidemia: On atorvastatin.  Though LFTs were elevated in the setting of volume overload, they did steadily improve throughout hospitalization.  Requested complete metabolic panel today though it looks like only a basic metabolic panel was drawn.  Can plan to follow-up LFTs when he returns in 1 month.  6.  Stage III chronic kidney disease: As above, creatinine elevated slightly since discharge at 1.60.  Reducing Lasix 20 mg daily and 20 mg as needed.  Follow-up labs return office visit.  7.  Disposition: Follow-up in 1 month with plan for complete metabolic panel at that time.  Nicolasa Ducking, NP 09/21/2023, 5:15 PM

## 2023-09-22 ENCOUNTER — Telehealth: Payer: Self-pay

## 2023-09-22 DIAGNOSIS — I480 Paroxysmal atrial fibrillation: Secondary | ICD-10-CM | POA: Diagnosis not present

## 2023-09-22 DIAGNOSIS — H9190 Unspecified hearing loss, unspecified ear: Secondary | ICD-10-CM | POA: Diagnosis not present

## 2023-09-22 DIAGNOSIS — I509 Heart failure, unspecified: Secondary | ICD-10-CM | POA: Diagnosis not present

## 2023-09-22 DIAGNOSIS — R7881 Bacteremia: Secondary | ICD-10-CM | POA: Diagnosis not present

## 2023-09-22 DIAGNOSIS — B952 Enterococcus as the cause of diseases classified elsewhere: Secondary | ICD-10-CM | POA: Diagnosis not present

## 2023-09-22 DIAGNOSIS — I2510 Atherosclerotic heart disease of native coronary artery without angina pectoris: Secondary | ICD-10-CM | POA: Diagnosis not present

## 2023-09-22 DIAGNOSIS — I38 Endocarditis, valve unspecified: Secondary | ICD-10-CM | POA: Diagnosis not present

## 2023-09-22 DIAGNOSIS — I110 Hypertensive heart disease with heart failure: Secondary | ICD-10-CM | POA: Diagnosis not present

## 2023-09-22 DIAGNOSIS — G609 Hereditary and idiopathic neuropathy, unspecified: Secondary | ICD-10-CM | POA: Diagnosis not present

## 2023-09-22 DIAGNOSIS — D649 Anemia, unspecified: Secondary | ICD-10-CM | POA: Diagnosis not present

## 2023-09-22 NOTE — Telephone Encounter (Signed)
Front desk received call from [place] with [facility] wanting to know if there have been any changes to patient's OPAT orders. He missed his 9/25 appointment due to being hospitalized.   She would like to know if okay to pull PICC on 10/16 as originally planned or if patient needs to be seen in ID clinic. Will route to provider.   Renee: [phone]  Sandie Ano, RN

## 2023-09-22 NOTE — Telephone Encounter (Signed)
Prior end date was 10/16. Can keep the same date for now. Please arrange for fu prior to EOT.

## 2023-09-22 NOTE — Telephone Encounter (Signed)
Spoke with patient's son, Casimiro Needle ([place]), set up an appointment for Kevin Collins on 10/11.  Called Renee back, let her know that patient is coming in Friday and nurse/CMA working with Dr. Elinor Parkinson will communicate any new orders at the time of the appointment.   Sandie Ano, RN

## 2023-09-25 ENCOUNTER — Other Ambulatory Visit: Payer: Self-pay

## 2023-09-25 ENCOUNTER — Encounter: Payer: Self-pay | Admitting: Infectious Diseases

## 2023-09-25 ENCOUNTER — Ambulatory Visit: Payer: Medicare PPO | Admitting: Infectious Diseases

## 2023-09-25 ENCOUNTER — Telehealth: Payer: Self-pay

## 2023-09-25 VITALS — BP 110/74 | HR 81 | Resp 16 | Ht 70.0 in | Wt 151.8 lb

## 2023-09-25 DIAGNOSIS — Z95828 Presence of other vascular implants and grafts: Secondary | ICD-10-CM | POA: Insufficient documentation

## 2023-09-25 DIAGNOSIS — W19XXXA Unspecified fall, initial encounter: Secondary | ICD-10-CM | POA: Insufficient documentation

## 2023-09-25 DIAGNOSIS — M25551 Pain in right hip: Secondary | ICD-10-CM | POA: Diagnosis not present

## 2023-09-25 DIAGNOSIS — B952 Enterococcus as the cause of diseases classified elsewhere: Secondary | ICD-10-CM | POA: Diagnosis not present

## 2023-09-25 DIAGNOSIS — T827XXD Infection and inflammatory reaction due to other cardiac and vascular devices, implants and grafts, subsequent encounter: Secondary | ICD-10-CM | POA: Diagnosis not present

## 2023-09-25 DIAGNOSIS — M160 Bilateral primary osteoarthritis of hip: Secondary | ICD-10-CM | POA: Diagnosis not present

## 2023-09-25 DIAGNOSIS — R7881 Bacteremia: Secondary | ICD-10-CM

## 2023-09-25 DIAGNOSIS — Z79899 Other long term (current) drug therapy: Secondary | ICD-10-CM

## 2023-09-25 DIAGNOSIS — T827XXA Infection and inflammatory reaction due to other cardiac and vascular devices, implants and grafts, initial encounter: Secondary | ICD-10-CM | POA: Insufficient documentation

## 2023-09-25 MED ORDER — FUROSEMIDE 40 MG PO TABS: ORAL_TABLET | ORAL | 3 refills | Status: DC

## 2023-09-25 MED ORDER — AMOXICILLIN 500 MG PO TABS: 500.0000 mg | ORAL_TABLET | Freq: Three times a day (TID) | ORAL | 5 refills | Status: DC

## 2023-09-25 NOTE — Telephone Encounter (Signed)
  Per provider ok to PULL PICC after End Date.   Provider: Odette Fraction MD  End Date:09/30/2023 after last dose   Notified [facility] and [facility] to pull picc after last dose in 09/30/23.

## 2023-09-25 NOTE — Telephone Encounter (Signed)
-----   Message from Nicolasa Ducking sent at 09/21/2023  5:08 PM EDT ----- Creatinine elevated above prior baseline at 1.60.  Suspect he may be a little bit on the dry side.  Recommend reducing Lasix to 20 mg daily with an additional 20 mg daily as needed for weight gain of 2 pounds or greater in 24 hours.  Blood counts are stable very with moderate anemia with a hemoglobin of 9.5.

## 2023-09-25 NOTE — Telephone Encounter (Signed)
Lab results discussed with son. He will decrease his fathers Lasix to 20 mg daily and weigh him also.He verbalized understanding and would give additional lasix for weight gain.PCP copied

## 2023-09-25 NOTE — Progress Notes (Signed)
Patient Active Problem List   Diagnosis Date Noted   Transaminitis 09/10/2023   Bacteremia 09/10/2023   AKI (acute kidney injury) (HCC) 09/10/2023   CHF (congestive heart failure) (HCC) 09/09/2023   Pain in left foot 08/21/2023   Peripheral neuropathy 08/19/2023   Paroxysmal atrial fibrillation (HCC) 08/19/2023   Sepsis due to undetermined organism (HCC) 08/18/2023   Symptomatic bradycardia 01/24/2022   Bradycardia 05/07/2020   Nonrheumatic aortic valve insufficiency 05/07/2020   Acute on chronic combined systolic and diastolic HF (heart failure) (HCC) 05/07/2020   Alcoholic cardiomyopathy (HCC) 29/56/2130   Acute heart failure (HCC) 08/31/2019   Medication management 08/31/2019   Atrial fibrillation (HCC) 08/31/2019   Coronary artery disease involving native coronary artery of native heart without angina pectoris 05/25/2019   Essential hypertension 05/25/2019   Dyslipidemia 05/25/2019   Educated about COVID-19 virus infection 05/25/2019    Patient's Medications  New Prescriptions   No medications on file  Previous Medications   AMPICILLIN IVPB    Inject 8 g into the vein daily for 16 days. As a continuous infusion Indication:  Endocarditis First Dose: Yes Last Day of Therapy:  09/30/2023 Labs - Once weekly:  CBC/D and BMP, Labs - Once weekly: ESR and CRP Method of administration: Ambulatory Pump (Continuous Infusion) Method of administration may be changed at the discretion of home infusion pharmacist based upon assessment of the patient and/or caregiver's ability to self-administer the medication ordered.   ATORVASTATIN (LIPITOR) 40 MG TABLET    Take 1 tablet (40 mg total) by mouth daily.   BIMATOPROST (LUMIGAN) 0.01 % SOLN    Place 1 drop into both eyes at bedtime.   CEFTRIAXONE (ROCEPHIN) IVPB    Inject 2 g into the vein every 12 (twelve) hours for 16 days. Indication:  Endocarditis First Dose: Yes Last Day of Therapy:  09/30/2023 Labs - Once weekly:  CBC/D and  BMP, Labs - Once weekly: ESR and CRP Method of administration: IV Push Method of administration may be changed at the discretion of home infusion pharmacist based upon assessment of the patient and/or caregiver's ability to self-administer the medication ordered.   CINACALCET (SENSIPAR) 90 MG TABLET    Take 90 mg by mouth daily.   ELIQUIS 2.5 MG TABS TABLET    TAKE 1 TABLET TWICE A DAY   EZETIMIBE (ZETIA) 10 MG TABLET    TAKE 1 TABLET DAILY (NEED APPOINTMENT FOR FUTURE REFILLS)   FUROSEMIDE (LASIX) 40 MG TABLET    Take 1 tablet (40 mg total) by mouth daily.   GABAPENTIN (NEURONTIN) 300 MG CAPSULE    Take 1 capsule (300 mg total) by mouth 3 (three) times daily.   LOSARTAN (COZAAR) 25 MG TABLET    Take 0.5 tablets (12.5 mg total) by mouth daily.   MULTIPLE VITAMINS-MINERALS (PRESERVISION AREDS) CAPS    Take 1 capsule by mouth 2 (two) times daily.   OXYCODONE-ACETAMINOPHEN (PERCOCET) 7.5-325 MG TABLET    Take 1 tablet by mouth every 8 (eight) hours as needed for severe pain.   POTASSIUM CHLORIDE SA (KLOR-CON M) 20 MEQ TABLET    Take 1 tablet (20 mEq total) by mouth daily.   SPIRONOLACTONE (ALDACTONE) 25 MG TABLET    Take 0.5 tablets (12.5 mg total) by mouth daily.  Modified Medications   No medications on file  Discontinued Medications   No medications on file    Subjective: 89 year old male w PMH of CAD s/p CABG, HTN, dyslipidemia, CKD, peripheral neuropathy, s/p  biventricular pacemaker 01/24/2022 who is here for HFU for E faecalis bacteremia, presumptive endocarditis  and CIED infection. Patient was discharged 9/7 with plan to complete 6 weeks of IV ampicillin and ceftriaxone, EOT 10/16 to be followed by PO amoxicillin suppression indefinitely.   Patient was seen in the ED 9/18 for fluid overload and left AMA, then admitted 9/25-9/30 for volume overload/acute CHFrEF. He was managed with diuretics, rt sided thoracentesis. TTE with EF 35-40% and discharged after improvement. He was seen by  Cardiology for fu on 10/7  09/24/23 Accompanied by his son. Getting IV abtx from his rt arm PICC without any concerns. Left lateral foot wound has healed. Reports falling down prior to his last admission at [facility]. Reports it did not bother him when hospitalized but has recently noticed a huge bruise in the rt hip and he is planned to get an Xray today. Denies warmth and tenderness. Denies fevers, chills. Denies nausea, vomiting, diarrhea. Denies difficulty walking or standing but has difficulty elevating his rt leg. He is currently on Eliquis, an anticoagulant.  Review of Systems: all systems reviewed with pertinent positives and negatives as listed above  Past Medical History:  Diagnosis Date   Bacteremia 08/2023   Bradycardia, unspecified    CAD (coronary artery disease)    a. 2000 s/p CABG x 4 (LIMA->LAD, VG->OM, VG->Diag, VG->RPDA/RPL.   Carotid arterial disease (HCC)    a. 12/2019 U/S: RICA 1-39% bilat.   Chronic HFrEF (heart failure with reduced ejection fraction) (HCC)    a. 08/2023 Echo: EF 35-40%, glob HK, GrIII DD, mildly reduced RV fxn, sev BAE, mod MR, mild-mod TR.   CKD (chronic kidney disease), stage III (HCC)    Hereditary and idiopathic neuropathy, unspecified    History of shingles    Hyperlipidemia    Hypertension    ESSENTIAL PRIMARY   NICM (nonischemic cardiomyopathy) (HCC)    a. 01/2022 s/p SJM 3562, quadra Allure MP CRT-P (LV/RV leads, no RA); b. 08/2023 Echo: EF 35-40%.   Peripheral neuropathy    Persistent atrial fibrillation (HCC)    a. CHA2DS2VASc = 5-->eliquis.   Past Surgical History:  Procedure Laterality Date   BIV PACEMAKER INSERTION CRT-P N/A 01/24/2022   Procedure: BIV PACEMAKER INSERTION CRT-P;  Surgeon: Lanier Prude, MD;  Location: [facility] INVASIVE CV LAB;  Service: Cardiovascular;  Laterality: N/A;   CARDIAC CATHETERIZATION Left 09/1999   NORMAL LEFT MAIN, OCCLUDED LAD, OCCLUDED MID CFX, OCCLUDED PROXIMAL RCA, 60% STENOSIS PROXIMAL DIAG 1, RIGHT TO LEFT  COLLATERAL, LEFT TO RIGHT COLLATERAL; LVEF OF 48% DOCUMENTED VIA NUCLEAR STUDY ON 07/12/2009.   CARDIOVERSION N/A 10/05/2019   Procedure: CARDIOVERSION;  Surgeon: Thurmon Fair, MD;  Location: [facility] ENDOSCOPY;  Service: Cardiovascular;  Laterality: N/A;   CATARACT EXTRACTION     CORONARY ARTERY BYPASS GRAFT     w/ LIMA TO LAD, SVG TO dx, SVG TO OM, SVG TO AM-PD-PL 10/12/99 [facility]   REMOVAL OF ANKLE PLATE      Social History   Tobacco Use   Smoking status: Former   Smokeless tobacco: Never  Vaping Use   Vaping status: Never Used  Substance Use Topics   Alcohol use: Never   Drug use: Never    Family History  Problem Relation Age of Onset   Other Mother        MALIGNANT TUMOR OF OVARY   CVA Father     No Known Allergies  Health Maintenance  Topic Date Due   COVID-19 Vaccine (1) Never  done   DTaP/Tdap/Td (1 - Tdap) Never done   Zoster Vaccines- Shingrix (1 of 2) Never done   Pneumonia Vaccine 4+ Years old (1 of 1 - PCV) Never done   Medicare Annual Wellness (AWV)  08/13/2022   INFLUENZA VACCINE  Never done   HPV VACCINES  Aged Out    Objective: BP 110/74   Pulse 81   Resp 16   Ht 5\' 10"  (1.778 m)   Wt 151 lb 12.8 oz (68.9 kg)   SpO2 98%   BMI 21.78 kg/m    Physical Exam Constitutional:      Appearance: Normal appearance.  HENT:     Head: Normocephalic and atraumatic.      Mouth: Mucous membranes are moist.  Eyes:    Conjunctiva/sclera: Conjunctivae normal.     Pupils: Pupils are equal, round, and bilaterally symmetrical   Cardiovascular:     Rate and Rhythm: Normal rate and Irregular rhythm.     Heart sounds:    Pulmonary:     Effort: Pulmonary effort is normal.     Breath sounds:   Abdominal:     General: Non distended     Palpations:   Musculoskeletal:        General: Normal range of motion. Left lateral wound has healed. Rt hip with a large bruise, no signs of cellulitis   Skin:    General: Skin is warm and dry.     Comments: left PPM  pocket with no signs of infection   Neurological:     General: grossly non focal     Mental Status: awake, alert and oriented to person, place, and time. Sitting in wheelchair   Psychiatric:        Mood and Affect: Mood normal.   Lab Results Lab Results  Component Value Date   WBC 8.5 09/21/2023   HGB 9.5 (L) 09/21/2023   HCT 31.7 (L) 09/21/2023   MCV 99.1 09/21/2023   PLT 157 09/21/2023    Lab Results  Component Value Date   CREATININE 1.60 (H) 09/21/2023   BUN 28 (H) 09/21/2023   NA 135 09/21/2023   K 4.6 09/21/2023   CL 97 (L) 09/21/2023   CO2 27 09/21/2023    Lab Results  Component Value Date   ALT 189 (H) 09/13/2023   AST 114 (H) 09/13/2023   ALKPHOS 43 09/13/2023   BILITOT 0.5 09/13/2023    No results found for: "CHOL", "HDL", "LDLCALC", "LDLDIRECT", "TRIG", "CHOLHDL" No results found for: "LABRPR", "RPRTITER" No results found for: "HIV1RNAQUANT", "HIV1RNAVL", "CD4TABS"   Assessment/Plan 89 year old male w PMH of CAD s/p CABG, HTN, dyslipidemia, peripheral neuropathy, s/p biventricular pacemaker 01/24/2022 who presented to [facility] ED 9/3 with acute onset  fevers chills and generalized weakness for 1 day. Admitted with    # E faecalis bacteremia, presumptive endocarditis as well as CIED infection  Plan  - complete 6 weeks of IV ceftriaxone + IV ampicillin. EOT 09/30/23 - start PO Amoxicillin 500 mg po tid 10/17 to be continued indefinitely  - fu in a month   # CHFrEF/CAD/Persistent A fib  - on medications - fu with Cardiology   # Fall/rt hip bruise - getting an Xray rt hip   # PICC + - no issues - to be removed after last dose of IV abtx   # Medication management - 10/7 CBC and BMP unremarkable   I have personally spent 41 minutes involved in face-to-face and non-face-to-face activities for this patient on the  day of the visit. Professional time spent includes the following activities: Preparing to see the patient (review of tests), Obtaining and/or  reviewing separately obtained history (admission/discharge record), Performing a medically appropriate examination and/or evaluation , Ordering medications/tests/procedures, referring and communicating with other health care professionals, Documenting clinical information in the EMR, Independently interpreting results (not separately reported), Communicating results to the patient/family/caregiver, Counseling and educating the patient/family/caregiver and Care coordination (not separately reported).   Victoriano Lain, MD [facility] 09/25/2023, 10:43 AM

## 2023-09-26 DIAGNOSIS — I38 Endocarditis, valve unspecified: Secondary | ICD-10-CM | POA: Diagnosis not present

## 2023-09-29 ENCOUNTER — Ambulatory Visit: Payer: Medicare PPO | Attending: Cardiology

## 2023-09-29 DIAGNOSIS — Z95810 Presence of automatic (implantable) cardiac defibrillator: Secondary | ICD-10-CM | POA: Diagnosis not present

## 2023-09-29 DIAGNOSIS — I5022 Chronic systolic (congestive) heart failure: Secondary | ICD-10-CM | POA: Diagnosis not present

## 2023-09-30 NOTE — Progress Notes (Signed)
EPIC Encounter for ICM Monitoring  Patient Name: Kevin Collins is a 89 y.o. male Date: 09/30/2023 Primary Care Physican: Toma Deiters, MD Primary Cardiologist: Hochrein Electrophysiologist: Townsend Roger Pacing: 94%            05/14/2023 Weight: 155 lbs  07/22/2023 Weight: 154 lbs 09/30/2023 Weight:  155 lbs (baseline)                                                            Spoke with patient and heart failure questions reviewed.  Transmission results reviewed.  Pt reports leg swelling has resolved and weight is stable.  He is feeling better since last hospital discharge 9/30.   As of 10/16, he has completed home  the IV fluid at home and will have PICC line removed tomorrow.       CorVue thoracic impedance suggesting possible fluid accumulation starting 10/8.   Prescribed:  Furosemide 40 mg take 0.5 tablet(s) (20 mg total) by mouth daily (started 10/11). May take an additional 20 mg for weight gain over 2 lbs in 24 hours  Potassium 20 mEq take 1 tablet by mouth daily Spironolactone 25 mg take 0.5 tablet (12.5 mg total) by mouth daily.                                              Labs: 09/21/2023 Creatinine 1.60, BUN 28, Potassium 4.6, Sodium 135, GFR 40  09/14/2023 Creatinine 1.24, BUN 17, Potassium 3.4, Sodium 134, GFR 55  09/13/2023 Creatinine 1.26, BUN 20, Potassium 3.0, Sodium 136, GFR 54  09/12/2023 Creatinine 1.39, BUN 21, Potassium 3.3, Sodium 137, GFR 48 09/11/2023 Creatinine 1.48, BUN 19, Potassium 3.4, Sodium 138, GFR 44  A complete set of results can be found in Results Review.   Recommendations:  He has been taking Lasix 40 mg daily and will decrease the Lasix to 20 mg starting tomorrow as instructed.  Discussed monitoring daily weights and agreed to weight daily and adjust Lasix based on weight per new lasix prescription.  He is not currently experiencing any fluid symptoms   Follow-up plan: ICM clinic phone appointment on 10/06/2023 to recheck fluid levels.   91 day  device clinic remote transmission 11/03/2023.     EP/Cardiology Office Visits:   10/14/2023 with Dr. Lalla Brothers.  10/23/2023 with Randall An, PA.   Recall 07/18/2023 with Dr Antoine Poche.     Copy of ICM check sent to Dr. Lalla Brothers and Dr Antoine Poche for review.   3 month ICM trend: 09/29/2023.    12-14 Month ICM trend:     Karie Soda, RN 09/30/2023 8:05 AM

## 2023-10-01 DIAGNOSIS — H9190 Unspecified hearing loss, unspecified ear: Secondary | ICD-10-CM | POA: Diagnosis not present

## 2023-10-01 DIAGNOSIS — I480 Paroxysmal atrial fibrillation: Secondary | ICD-10-CM | POA: Diagnosis not present

## 2023-10-01 DIAGNOSIS — R7881 Bacteremia: Secondary | ICD-10-CM | POA: Diagnosis not present

## 2023-10-01 DIAGNOSIS — I110 Hypertensive heart disease with heart failure: Secondary | ICD-10-CM | POA: Diagnosis not present

## 2023-10-01 DIAGNOSIS — I509 Heart failure, unspecified: Secondary | ICD-10-CM | POA: Diagnosis not present

## 2023-10-01 DIAGNOSIS — I2510 Atherosclerotic heart disease of native coronary artery without angina pectoris: Secondary | ICD-10-CM | POA: Diagnosis not present

## 2023-10-01 DIAGNOSIS — G609 Hereditary and idiopathic neuropathy, unspecified: Secondary | ICD-10-CM | POA: Diagnosis not present

## 2023-10-01 DIAGNOSIS — D649 Anemia, unspecified: Secondary | ICD-10-CM | POA: Diagnosis not present

## 2023-10-01 DIAGNOSIS — B952 Enterococcus as the cause of diseases classified elsewhere: Secondary | ICD-10-CM | POA: Diagnosis not present

## 2023-10-06 ENCOUNTER — Ambulatory Visit: Payer: Self-pay | Admitting: *Deleted

## 2023-10-06 ENCOUNTER — Ambulatory Visit: Payer: Medicare PPO | Attending: Cardiology

## 2023-10-06 DIAGNOSIS — I480 Paroxysmal atrial fibrillation: Secondary | ICD-10-CM | POA: Diagnosis not present

## 2023-10-06 DIAGNOSIS — I5032 Chronic diastolic (congestive) heart failure: Secondary | ICD-10-CM | POA: Diagnosis not present

## 2023-10-06 DIAGNOSIS — M8608 Acute hematogenous osteomyelitis, other sites: Secondary | ICD-10-CM | POA: Diagnosis not present

## 2023-10-06 DIAGNOSIS — E876 Hypokalemia: Secondary | ICD-10-CM | POA: Diagnosis not present

## 2023-10-06 DIAGNOSIS — Z6824 Body mass index (BMI) 24.0-24.9, adult: Secondary | ICD-10-CM | POA: Diagnosis not present

## 2023-10-06 DIAGNOSIS — E8809 Other disorders of plasma-protein metabolism, not elsewhere classified: Secondary | ICD-10-CM | POA: Diagnosis not present

## 2023-10-06 DIAGNOSIS — N1831 Chronic kidney disease, stage 3a: Secondary | ICD-10-CM | POA: Diagnosis not present

## 2023-10-06 DIAGNOSIS — M1009 Idiopathic gout, multiple sites: Secondary | ICD-10-CM | POA: Diagnosis not present

## 2023-10-06 DIAGNOSIS — I10 Essential (primary) hypertension: Secondary | ICD-10-CM | POA: Diagnosis not present

## 2023-10-06 DIAGNOSIS — I5022 Chronic systolic (congestive) heart failure: Secondary | ICD-10-CM

## 2023-10-06 DIAGNOSIS — Z95810 Presence of automatic (implantable) cardiac defibrillator: Secondary | ICD-10-CM

## 2023-10-06 DIAGNOSIS — Z0000 Encounter for general adult medical examination without abnormal findings: Secondary | ICD-10-CM | POA: Diagnosis not present

## 2023-10-06 NOTE — Patient Outreach (Signed)
Care Coordination   10/06/2023 Name: Kevin Collins MRN: [id number] DOB: [birth year]   Care Coordination Outreach Attempts:  An unsuccessful telephone outreach was attempted for a scheduled appointment today.  Follow Up Plan:  Additional outreach attempts will be made to offer the patient care coordination information and services.   Encounter Outcome:  No Answer. Left HIPAA compliant VM.   Care Coordination Interventions:  No, not indicated. Staff message sent to scheduling care guide requesting outreach and rescheduling.     Demetrios Loll, RN, BSN Care Management Coordinator [facility]  [facility] Direct Dial: [phone] Main #: [phone]

## 2023-10-07 NOTE — Progress Notes (Signed)
EPIC Encounter for ICM Monitoring  Patient Name: Kevin Collins is a 89 y.o. male Date: 10/07/2023 Primary Care Physican: Toma Deiters, MD Primary Cardiologist: Hochrein Electrophysiologist: Townsend Roger Pacing: 94%            05/14/2023 Weight: 155 lbs  07/22/2023 Weight: 154 lbs 09/30/2023 Weight:  155 lbs (baseline)           10/07/2023 Weight: 153 lbs                                                 Spoke with patient and heart failure questions reviewed.  Transmission results reviewed.  Pt asymptomatic for fluid accumulation.  He is taking Furosemide 20 mg daily and will increase to 40 if he develops symptoms.      CorVue thoracic impedance suggesting fluid levels returned to normal.   Prescribed:  Furosemide 40 mg take 0.5 tablet(s) (20 mg total) by mouth daily (started 10/11). May take an additional 20 mg for weight gain over 2 lbs in 24 hours  Potassium 20 mEq take 1 tablet by mouth daily Spironolactone 25 mg take 0.5 tablet (12.5 mg total) by mouth daily.                                              Labs: 09/21/2023 Creatinine 1.60, BUN 28, Potassium 4.6, Sodium 135, GFR 40  09/14/2023 Creatinine 1.24, BUN 17, Potassium 3.4, Sodium 134, GFR 55  09/13/2023 Creatinine 1.26, BUN 20, Potassium 3.0, Sodium 136, GFR 54  09/12/2023 Creatinine 1.39, BUN 21, Potassium 3.3, Sodium 137, GFR 48 09/11/2023 Creatinine 1.48, BUN 19, Potassium 3.4, Sodium 138, GFR 44  A complete set of results can be found in Results Review.   Recommendations:  No changes and encouraged to call if experiencing any fluid symptoms.   Follow-up plan: ICM clinic phone appointment on 11/02/2023.   91 day device clinic remote transmission 11/03/2023.     EP/Cardiology Office Visits:   10/14/2023 with Dr. Lalla Brothers.  10/23/2023 with Randall An, PA.   Recall 07/18/2023 with Dr Antoine Poche.     Copy of ICM check sent to Dr. Lalla Brothers.  3 month ICM trend: 10/06/2023.    12-14 Month ICM trend:     Karie Soda,  RN 10/07/2023 9:07 AM

## 2023-10-08 DIAGNOSIS — D649 Anemia, unspecified: Secondary | ICD-10-CM | POA: Diagnosis not present

## 2023-10-08 DIAGNOSIS — B952 Enterococcus as the cause of diseases classified elsewhere: Secondary | ICD-10-CM | POA: Diagnosis not present

## 2023-10-08 DIAGNOSIS — H9190 Unspecified hearing loss, unspecified ear: Secondary | ICD-10-CM | POA: Diagnosis not present

## 2023-10-08 DIAGNOSIS — I110 Hypertensive heart disease with heart failure: Secondary | ICD-10-CM | POA: Diagnosis not present

## 2023-10-08 DIAGNOSIS — R7881 Bacteremia: Secondary | ICD-10-CM | POA: Diagnosis not present

## 2023-10-08 DIAGNOSIS — G609 Hereditary and idiopathic neuropathy, unspecified: Secondary | ICD-10-CM | POA: Diagnosis not present

## 2023-10-08 DIAGNOSIS — I2510 Atherosclerotic heart disease of native coronary artery without angina pectoris: Secondary | ICD-10-CM | POA: Diagnosis not present

## 2023-10-08 DIAGNOSIS — I509 Heart failure, unspecified: Secondary | ICD-10-CM | POA: Diagnosis not present

## 2023-10-08 DIAGNOSIS — I480 Paroxysmal atrial fibrillation: Secondary | ICD-10-CM | POA: Diagnosis not present

## 2023-10-13 ENCOUNTER — Telehealth: Payer: Self-pay | Admitting: *Deleted

## 2023-10-13 DIAGNOSIS — D649 Anemia, unspecified: Secondary | ICD-10-CM | POA: Diagnosis not present

## 2023-10-13 DIAGNOSIS — G609 Hereditary and idiopathic neuropathy, unspecified: Secondary | ICD-10-CM | POA: Diagnosis not present

## 2023-10-13 DIAGNOSIS — B952 Enterococcus as the cause of diseases classified elsewhere: Secondary | ICD-10-CM | POA: Diagnosis not present

## 2023-10-13 DIAGNOSIS — I110 Hypertensive heart disease with heart failure: Secondary | ICD-10-CM | POA: Diagnosis not present

## 2023-10-13 DIAGNOSIS — R7881 Bacteremia: Secondary | ICD-10-CM | POA: Diagnosis not present

## 2023-10-13 DIAGNOSIS — I2510 Atherosclerotic heart disease of native coronary artery without angina pectoris: Secondary | ICD-10-CM | POA: Diagnosis not present

## 2023-10-13 DIAGNOSIS — H9190 Unspecified hearing loss, unspecified ear: Secondary | ICD-10-CM | POA: Diagnosis not present

## 2023-10-13 DIAGNOSIS — I480 Paroxysmal atrial fibrillation: Secondary | ICD-10-CM | POA: Diagnosis not present

## 2023-10-13 DIAGNOSIS — I509 Heart failure, unspecified: Secondary | ICD-10-CM | POA: Diagnosis not present

## 2023-10-13 NOTE — Progress Notes (Signed)
Care Coordination Note  10/13/2023 Name: Kevin Collins MRN: [id number] DOB: 07-03-1930  Kevin Collins is a 89 y.o. year old male who is a primary care patient of Hasanaj, Myra Gianotti, MD and is actively engaged with the care management team. I reached out to Kevin Collins by phone today to assist with re-scheduling an initial visit with the RN Case Manager  Follow up plan: Unsuccessful telephone outreach attempt made. A HIPAA compliant phone message was left for the patient providing contact information and requesting a return call.   [facility]  Care Coordination Care Guide  Direct Dial: [phone]

## 2023-10-14 ENCOUNTER — Ambulatory Visit: Payer: Medicare PPO | Attending: Cardiology | Admitting: Cardiology

## 2023-10-14 ENCOUNTER — Encounter: Payer: Self-pay | Admitting: Cardiology

## 2023-10-14 VITALS — BP 98/62 | HR 58 | Ht 70.0 in | Wt 162.0 lb

## 2023-10-14 DIAGNOSIS — R7881 Bacteremia: Secondary | ICD-10-CM | POA: Diagnosis not present

## 2023-10-14 DIAGNOSIS — I5022 Chronic systolic (congestive) heart failure: Secondary | ICD-10-CM

## 2023-10-14 DIAGNOSIS — Z95810 Presence of automatic (implantable) cardiac defibrillator: Secondary | ICD-10-CM

## 2023-10-14 LAB — PACEMAKER DEVICE OBSERVATION

## 2023-10-14 NOTE — Patient Instructions (Signed)
Medication Instructions:  Your physician recommends that you continue on your current medications as directed. Please refer to the Current Medication list given to you today.  *If you need a refill on your cardiac medications before your next appointment, please call your pharmacy*  Follow-Up: At [facility], you and your health needs are our priority.  As part of our continuing mission to provide you with exceptional heart care, we have created designated Provider Care Teams.  These Care Teams include your primary Cardiologist (physician) and Advanced Practice Providers (APPs -  Physician Assistants and Nurse Practitioners) who all work together to provide you with the care you need, when you need it.   Your next appointment:   1 year  Provider:   You may see one of the following Advanced Practice Providers on your designated Care Team:   Francis Dowse, [place] [address] [place], [place] Sherie Don, NP Canary Brim, NP

## 2023-10-14 NOTE — Progress Notes (Signed)
Electrophysiology Office Follow up Visit Note:    Date:  10/14/2023   ID:  Kevin Collins, DOB Apr 05, 1930, MRN [id number]  PCP:  Toma Deiters, MD  [facility] Cardiologist:  Rollene Rotunda, MD  [facility] Electrophysiologist:  Lanier Prude, MD    Interval History:     Kevin Collins is a 89 y.o. male who presents for a follow up visit.   Discussed the use of AI scribe software for clinical note transcription with the patient, who gave verbal consent to proceed.  History of Present Illness   Kevin Collins, with a history of coronary artery disease, post CABG, hypertension, hyperlipidemia, and peripheral neuropathy, presents for follow up after recent hospitalization for bacteremia. He was treated with six weeks of IV antibiotics and continues on oral amoxicillin. He has a biventricular pacemaker implanted in February 2023 and wished to pursue long term antibiotic suppression over lead extraction. He is DNR.  He reports feeling weak and has not yet fully recovered from his hospital stays. He does not drive and occasionally stumbles but has not fallen. He attributes this to peripheral neuropathy in both feet. He also reports that his feet sometimes "go crazy," particularly when putting on shoes, but this does not affect his walking.  He was recently hospitalized again due to fluid overload after his Lasix was discontinued and he was given IV fluids. He is now back on Lasix 20mg  daily, increasing to 40mg  if he gains weight. He has a pacemaker, which he reports is keeping his heart rate steady at around 60 bpm.            Past medical, surgical, social and family history were reviewed.  ROS:   Please see the history of present illness.    All other systems reviewed and are negative.  EKGs/Labs/Other Studies Reviewed:    The following studies were reviewed today:  October 14, 2023 in clinic device interrogation personally reviewed Battery longevity 6.6 years Lead parameter  stable Presenting rhythm trigeminal PVCs Biventricular pacing 94%       Physical Exam:    VS:  BP 98/62   Pulse (!) 58   Ht 5\' 10"  (1.778 m)   Wt 162 lb (73.5 kg)   SpO2 99%   BMI 23.24 kg/m     Wt Readings from Last 3 Encounters:  10/14/23 162 lb (73.5 kg)  09/25/23 151 lb 12.8 oz (68.9 kg)  09/21/23 151 lb 12.8 oz (68.9 kg)     Physical Exam   GENERAL: No distress. elderly. CHEST: Lungs clear to auscultation. CARDIOVASCULAR: Regular rate and rhythm. SKIN: Pacemaker pocket well healed, no signs of infection.           ASSESSMENT:    1. Chronic HFrEF (heart failure with reduced ejection fraction) (HCC)   2. ICD (implantable cardioverter-defibrillator) in place   3. Bacteremia    PLAN:    In order of problems listed above:  Assessment and Plan    Pacemaker-related Infection Recent hospitalization for bacteremia, treated with IV antibiotics and now on long-term oral amoxicillin. Patient prefers medical management over invasive lead extraction. Pacemaker pocket well-healed with no signs of infection on exam. -Continue long-term oral amoxicillin.  Chronic Systolic Heart Failure NYHA class II-III, warm on exam. Biventricular pacemaker in place. -Continue Lasix 20mg  daily, Losartan, and Spironolactone. -Continue remote monitoring of ICD.  Atrial Fibrillation On Eliquis 2.5mg  BID, appropriate for age and renal function. -Continue Eliquis 2.5mg  BID. -Follow up with EP APP in 1 year.  Peripheral Neuropathy Reports occasional instability and stumbling, but no falls. Symptoms worse when putting on shoes, but no trouble walking. -PCP follow up  Follow up 1 yr w/ APP               Signed, Steffanie Dunn, MD, [facility], [facility] 10/14/2023 8:05 PM    Electrophysiology [facility]

## 2023-10-18 LAB — CUP PACEART INCLINIC DEVICE CHECK
Battery Remaining Longevity: 72 mo
Battery Voltage: 3.01 V
Brady Statistic RA Percent Paced: 0 %
Brady Statistic RV Percent Paced: 94 %
Date Time Interrogation Session: 20241030153946
Implantable Lead Connection Status: 753985
Implantable Lead Connection Status: 753985
Implantable Lead Implant Date: 20230210
Implantable Lead Implant Date: 20230210
Implantable Lead Location: 753858
Implantable Lead Location: 753860
Implantable Pulse Generator Implant Date: 20230210
Lead Channel Impedance Value: 387.5 Ohm
Lead Channel Impedance Value: 450 Ohm
Lead Channel Pacing Threshold Amplitude: 0.5 V
Lead Channel Pacing Threshold Amplitude: 0.5 V
Lead Channel Pacing Threshold Amplitude: 1 V
Lead Channel Pacing Threshold Amplitude: 1 V
Lead Channel Pacing Threshold Pulse Width: 0.5 ms
Lead Channel Pacing Threshold Pulse Width: 0.5 ms
Lead Channel Pacing Threshold Pulse Width: 0.5 ms
Lead Channel Pacing Threshold Pulse Width: 0.5 ms
Lead Channel Sensing Intrinsic Amplitude: 10.5 mV
Lead Channel Setting Pacing Amplitude: 2.25 V
Lead Channel Setting Pacing Amplitude: 2.5 V
Lead Channel Setting Pacing Pulse Width: 0.5 ms
Lead Channel Setting Pacing Pulse Width: 0.5 ms
Lead Channel Setting Sensing Sensitivity: 4 mV
Pulse Gen Model: 3562
Pulse Gen Serial Number: 3992207

## 2023-10-20 NOTE — Progress Notes (Signed)
  Care Coordination Note  10/20/2023 Name: Kevin Collins MRN: [id number] DOB: 1930/07/27  Kevin Collins is a 89 y.o. year old male who is a primary care patient of Hasanaj, Myra Gianotti, MD and is actively engaged with the care management team. I reached out to Kevin Collins by phone today to assist with re-scheduling an initial visit with the RN Case Manager  Follow up plan: Unsuccessful telephone outreach attempt made. A HIPAA compliant phone message was left for the patient providing contact information and requesting a return call.  We have been unable to make contact with the patient for follow up. The care management team is available to follow up with the patient after provider conversation with the patient regarding recommendation for care management engagement and subsequent re-referral to the care management team.   [facility] Coordination Care Guide  Direct Dial: [phone]

## 2023-10-23 ENCOUNTER — Encounter: Payer: Self-pay | Admitting: Internal Medicine

## 2023-10-23 ENCOUNTER — Ambulatory Visit: Payer: Medicare PPO | Attending: Student | Admitting: Student

## 2023-10-23 ENCOUNTER — Encounter: Payer: Self-pay | Admitting: Student

## 2023-10-23 VITALS — BP 108/58 | HR 68 | Ht 70.0 in | Wt 162.4 lb

## 2023-10-23 DIAGNOSIS — E785 Hyperlipidemia, unspecified: Secondary | ICD-10-CM

## 2023-10-23 DIAGNOSIS — I2510 Atherosclerotic heart disease of native coronary artery without angina pectoris: Secondary | ICD-10-CM | POA: Diagnosis not present

## 2023-10-23 DIAGNOSIS — I10 Essential (primary) hypertension: Secondary | ICD-10-CM

## 2023-10-23 DIAGNOSIS — I4811 Longstanding persistent atrial fibrillation: Secondary | ICD-10-CM | POA: Diagnosis not present

## 2023-10-23 DIAGNOSIS — N1832 Chronic kidney disease, stage 3b: Secondary | ICD-10-CM

## 2023-10-23 DIAGNOSIS — I5022 Chronic systolic (congestive) heart failure: Secondary | ICD-10-CM

## 2023-10-23 MED ORDER — FUROSEMIDE 20 MG PO TABS: 20.0000 mg | ORAL_TABLET | Freq: Every day | ORAL | 3 refills | Status: DC

## 2023-10-23 MED ORDER — SIMVASTATIN 40 MG PO TABS: 40.0000 mg | ORAL_TABLET | Freq: Every day | ORAL | 3 refills | Status: DC

## 2023-10-23 MED ORDER — APIXABAN 2.5 MG PO TABS: 2.5000 mg | ORAL_TABLET | Freq: Two times a day (BID) | ORAL | 3 refills | Status: DC

## 2023-10-23 NOTE — Patient Instructions (Signed)
Medication Instructions:   Stop Taking Lipitor   Start Taking Simvastatin 40 mg Daily   *If you need a refill on your cardiac medications before your next appointment, please call your pharmacy*   Lab Work: Your physician recommends that you return for lab work in: 6-8 Weeks ( CMET, Lipid)   If you have labs (blood work) drawn today and your tests are completely normal, you will receive your results only by: MyChart Message (if you have MyChart) OR A paper copy in the mail If you have any lab test that is abnormal or we need to change your treatment, we will call you to review the results.   Testing/Procedures: NONE    Follow-Up: At [facility], you and your health needs are our priority.  As part of our continuing mission to provide you with exceptional heart care, we have created designated Provider Care Teams.  These Care Teams include your primary Cardiologist (physician) and Advanced Practice Providers (APPs -  Physician Assistants and Nurse Practitioners) who all work together to provide you with the care you need, when you need it.  We recommend signing up for the patient portal called "MyChart".  Sign up information is provided on this After Visit Summary.  MyChart is used to connect with patients for Virtual Visits (Telemedicine).  Patients are able to view lab/test results, encounter notes, upcoming appointments, etc.  Non-urgent messages can be sent to your provider as well.   To learn more about what you can do with MyChart, go to [email or web address].    Your next appointment:   3 month(s)  Provider:   You may see Rollene Rotunda, MD or one of the following Advanced Practice Providers on your designated Care Team:   Randall An, PA-C  Jacolyn Reedy, [place]     Other Instructions Thank you for choosing [facility]!

## 2023-10-23 NOTE — Progress Notes (Signed)
Cardiology Office Note    Date:  10/23/2023  ID:  [name], DOB [birth year], MRN [id number] Cardiologist: Rollene Rotunda, MD    History of Present Illness:    Kevin Collins is a 89 y.o. male with a past medical history of CAD (s/p CABG in 10/1999 with LIMA-LAD, SVG-OM, SVG-D1 and SVG-PDA/PL), persistent atrial fibrillation, HTN, HLD, Stage 3 CKD, chronic HFrEF (EF 40-45% in 2020 and 12/2021, at 50-55% by echo in 08/2023 but declined at 35 to 40% by repeat imaging later that month) and symptomatic bradycardia (s/p CRT-P BiV in 01/2022) who presents to the office today for 1 month follow-up.  He was admitted to [facility] in 08/2023 for Enterococcus bacillus bacteremia which was felt to be due to left foot cellulitis/osteomyelitis. Was again admitted to [facility] later that month for an acute CHF exacerbation and repeat echocardiogram showed his EF was at 35 to 40%. Options for ischemic evaluation were reviewed and he was very clear in his decision that he did not wish to pursue aggressive workup given his age and was DNR.  He did follow-up with Ward Givens, NP on 09/21/2023 and reported overall feeling well since hospital discharge. His weight had been stable at home and he denied any recent changes in his respiratory status. He was euvolemic on examination and given slight rise in creatinine, Lasix was reduced to 20 mg daily. He was continued on Losartan 12.5 mg daily and Spironolactone 12.5 mg daily. Beta-blocker therapy had previously been discontinued given hypotension. He did follow-up with Dr. Lalla Brothers later that month and he continued to prefer medical management of his possible pacemaker related infection as compared to undergoing invasive lead extraction. No changes were made to his cardiac medications at that time.  In talking with the patient today, he reports overall feeling well since his last office visit. His biggest issue has been worsening neuropathy and he reports Gabapentin was  reduced during the hospital stay and this has been resumed at his prior dosing by his PCP with gradual improvement in symptoms. He is not pleased that Simvastatin was switched to Atorvastatin during admission as he reports having myalgias with Atorvastatin in the past and did not tolerate this. He denies any specific exertional chest pain, dyspnea on exertion, orthopnea, PND or pitting edema.  Studies Reviewed:   EKG: EKG is not ordered today.  Echocardiogram: 08/2023 IMPRESSIONS     1. Left ventricular ejection fraction, by estimation, is 35 to 40%. The  left ventricle has moderately decreased function. The left ventricle  demonstrates global hypokinesis. Left ventricular diastolic parameters are  consistent with Grade III diastolic  dysfunction (restrictive). Elevated left atrial pressure. There is the  interventricular septum is flattened in systole, consistent with right  ventricular pressure overload.   2. Right ventricular systolic function is mildly reduced. The right  ventricular size is mild to moderately enlarged. There is moderately  elevated pulmonary artery systolic pressure.   3. Left atrial size was severely dilated.   4. Right atrial size was severely dilated.   5. The mitral valve is abnormal. Moderate mitral valve regurgitation. No  evidence of mitral stenosis.   6. The tricuspid valve is abnormal. Tricuspid valve regurgitation is mild  to moderate.   7. The aortic valve is tricuspid. Aortic valve regurgitation is mild. No  aortic stenosis is present.   8. The inferior vena cava is dilated in size with <50% respiratory  variability, suggesting right atrial pressure of 15 mmHg.  Risk Assessment/Calculations:    CHA2DS2-VASc Score = 5   This indicates a 7.2% annual risk of stroke. The patient's score is based upon: CHF History: 1 HTN History: 1 Diabetes History: 0 Stroke History: 0 Vascular Disease History: 1 Age Score: 2 Gender Score: 0    Physical  Exam:   VS:  BP (!) 108/58   Pulse 68   Ht 5\' 10"  (1.778 m)   Wt 162 lb 6.4 oz (73.7 kg)   SpO2 97%   BMI 23.30 kg/m    Wt Readings from Last 3 Encounters:  10/23/23 162 lb 6.4 oz (73.7 kg)  10/14/23 162 lb (73.5 kg)  09/25/23 151 lb 12.8 oz (68.9 kg)     GEN: Well nourished, well developed elderly male appearing in no acute distress NECK: No JVD; No carotid bruits CARDIAC: Irregularly irregular, no murmurs, rubs, gallops RESPIRATORY:  Clear to auscultation without rales, wheezing or rhonchi  ABDOMEN: Appears non-distended. No obvious abdominal masses. EXTREMITIES: No clubbing or cyanosis. No pitting edema.  Distal pedal pulses are 2+ bilaterally.   Assessment and Plan:   1. Chronic HFrEF - His ejection fraction was at 35 to 40% by most recent imaging. He appears euvolemic by examination today and denies any recent respiratory issues. Continue current medical therapy with Lasix 20 mg daily, Losartan 12.5 mg daily and Spironolactone 12.5 mg daily. Unable to further titrate medical therapy given his soft BP (at 108/58 during today's visit). Would not use an SGLT2 inhibitor given recent osteomyelitis.  2.  CAD - He underwent CABG in 2000 with no recent ischemic evaluation. He previously declined further workup given his advanced age. Thankfully, he is overall doing well and denies any recent anginal symptoms. Will adjust statin therapy as outlined below. Continue Zetia 10 mg daily and Losartan 12.5 mg daily. He is not on ASA given the need for anticoagulation.  3. Persistent Atrial Fibrillation - Recent device interrogation last month showed permanent atrial fibrillation with no high ventricular rates noted. Heart rate is well-controlled in the 60's today. He has not required the use of AV nodal blocking agents. - No reports of active bleeding. Continue Eliquis 2.5 mg twice daily for anticoagulation which is the appropriate dose at this time given his age, weight and renal function.    4. Stage 3 CKD - Baseline creatinine 1.2 -1.3. Was elevated at 1.60 when checked on 09/21/2023 and Lasix dosing was reduced at that time. Will recheck labs in the coming weeks if not checked by his PCP in the interim.  5. HTN - Blood pressure is soft at 108/58 during today's visit. Continue current medical therapy with Losartan 12.5 mg daily and Spironolactone 12.5 mg daily.  6. HLD - He reports myalgias with Atorvastatin 40 mg daily was previously on Simvastatin 80 mg daily and tolerated well and wants to resume this. We reviewed that this dose of Simvastatin is typically not recommended. Through mutual decision making, he will switch back to Simvastatin but at a lower dose of 40 mg daily. Continue Zetia 10 mg daily. Plan for repeat FLP and LFT's in 6-8 weeks.   Signed, Ellsworth Lennox, PA-C

## 2023-10-30 ENCOUNTER — Telehealth: Payer: Self-pay | Admitting: Internal Medicine

## 2023-10-30 NOTE — Telephone Encounter (Signed)
Patient states he received a notice that he missed an appointment on 11/08 with Dr. Jenene Slicker and he would like clarification. Please advise.

## 2023-11-02 ENCOUNTER — Ambulatory Visit: Payer: Medicare PPO | Attending: Cardiology

## 2023-11-02 DIAGNOSIS — Z95810 Presence of automatic (implantable) cardiac defibrillator: Secondary | ICD-10-CM

## 2023-11-02 DIAGNOSIS — I5042 Chronic combined systolic (congestive) and diastolic (congestive) heart failure: Secondary | ICD-10-CM

## 2023-11-03 ENCOUNTER — Ambulatory Visit (INDEPENDENT_AMBULATORY_CARE_PROVIDER_SITE_OTHER): Payer: Medicare PPO

## 2023-11-03 DIAGNOSIS — I255 Ischemic cardiomyopathy: Secondary | ICD-10-CM

## 2023-11-03 DIAGNOSIS — I5042 Chronic combined systolic (congestive) and diastolic (congestive) heart failure: Secondary | ICD-10-CM

## 2023-11-03 NOTE — Progress Notes (Signed)
EPIC Encounter for ICM Monitoring  Patient Name: Kevin Collins is a 89 y.o. male Date: 11/03/2023 Primary Care Physican: Toma Deiters, MD Primary Cardiologist: Hochrein Electrophysiologist: Townsend Roger Pacing: 90%            05/14/2023 Weight: 155 lbs  07/22/2023 Weight: 154 lbs 09/30/2023 Weight:  155 lbs (baseline)           10/07/2023 Weight: 153 lbs      11/03/2023 Weight: 155 lbs                                            Spoke with patient and heart failure questions reviewed.  Transmission results reviewed.  Pt asymptomatic for fluid accumulation.  Reports feeling well at this time and voices no complaints.      CorVue thoracic impedance suggesting normal fluid levels within the last month.   Prescribed:  Furosemide 40 mg take 0.5 tablet(s) (20 mg total) by mouth daily (started 10/11). May take an additional 20 mg for weight gain over 2 lbs in 24 hours  Potassium 20 mEq take 1 tablet by mouth daily Spironolactone 25 mg take 0.5 tablet (12.5 mg total) by mouth daily.                                              Labs: 09/21/2023 Creatinine 1.60, BUN 28, Potassium 4.6, Sodium 135, GFR 40  09/14/2023 Creatinine 1.24, BUN 17, Potassium 3.4, Sodium 134, GFR 55  09/13/2023 Creatinine 1.26, BUN 20, Potassium 3.0, Sodium 136, GFR 54  09/12/2023 Creatinine 1.39, BUN 21, Potassium 3.3, Sodium 137, GFR 48 09/11/2023 Creatinine 1.48, BUN 19, Potassium 3.4, Sodium 138, GFR 44  A complete set of results can be found in Results Review.   Recommendations:  No changes and encouraged to call if experiencing any fluid symptoms.   Follow-up plan: ICM clinic phone appointment on 12/07/2023.   91 day device clinic remote transmission 02/02/2024.     EP/Cardiology Office Visits:   Recall 10/08/2024 with Dr. Lalla Brothers or APP.   02/24/2024 with Dr Antoine Poche.     Copy of ICM check sent to Dr. Lalla Brothers.  3 month ICM trend: 11/02/2023.    12-14 Month ICM trend:     Karie Soda,  RN 11/03/2023 6:51 AM

## 2023-11-05 ENCOUNTER — Ambulatory Visit: Payer: Medicare PPO | Admitting: Infectious Diseases

## 2023-11-05 ENCOUNTER — Other Ambulatory Visit: Payer: Self-pay

## 2023-11-05 VITALS — BP 117/74 | HR 62 | Temp 97.9°F | Resp 16 | Wt 162.2 lb

## 2023-11-05 DIAGNOSIS — R7881 Bacteremia: Secondary | ICD-10-CM | POA: Diagnosis not present

## 2023-11-05 DIAGNOSIS — T827XXD Infection and inflammatory reaction due to other cardiac and vascular devices, implants and grafts, subsequent encounter: Secondary | ICD-10-CM

## 2023-11-05 DIAGNOSIS — Z452 Encounter for adjustment and management of vascular access device: Secondary | ICD-10-CM | POA: Insufficient documentation

## 2023-11-05 DIAGNOSIS — Z79899 Other long term (current) drug therapy: Secondary | ICD-10-CM | POA: Diagnosis not present

## 2023-11-05 NOTE — Progress Notes (Signed)
Patient Active Problem List   Diagnosis Date Noted   Pacemaker infection (HCC) 09/25/2023   S/P PICC central line placement 09/25/2023   Fall 09/25/2023   Bacteremia due to Enterococcus 09/25/2023   Transaminitis 09/10/2023   AKI (acute kidney injury) (HCC) 09/10/2023   CHF (congestive heart failure) (HCC) 09/09/2023   Pain in left foot 08/21/2023   Peripheral neuropathy 08/19/2023   Paroxysmal atrial fibrillation (HCC) 08/19/2023   Sepsis due to undetermined organism (HCC) 08/18/2023   Symptomatic bradycardia 01/24/2022   Bradycardia 05/07/2020   Nonrheumatic aortic valve insufficiency 05/07/2020   Acute on chronic combined systolic and diastolic HF (heart failure) (HCC) 05/07/2020   Alcoholic cardiomyopathy (HCC) 87/29/5621   Acute heart failure (HCC) 08/31/2019   Medication management 08/31/2019   Atrial fibrillation (HCC) 08/31/2019   Coronary artery disease involving native coronary artery of native heart without angina pectoris 05/25/2019   Essential hypertension 05/25/2019   Dyslipidemia 05/25/2019   Educated about COVID-19 virus infection 05/25/2019    Patient's Medications  New Prescriptions   No medications on file  Previous Medications   AMOXICILLIN (AMOXIL) 500 MG TABLET    Take 500 mg by mouth in the morning, at noon, and at bedtime.   APIXABAN (ELIQUIS) 2.5 MG TABS TABLET    Take 1 tablet (2.5 mg total) by mouth 2 (two) times daily.   BIMATOPROST (LUMIGAN) 0.01 % SOLN    Place 1 drop into both eyes at bedtime.   CINACALCET (SENSIPAR) 90 MG TABLET    Take 90 mg by mouth daily.   EZETIMIBE (ZETIA) 10 MG TABLET    TAKE 1 TABLET DAILY (NEED APPOINTMENT FOR FUTURE REFILLS)   FUROSEMIDE (LASIX) 20 MG TABLET    Take 1 tablet (20 mg total) by mouth daily. May take an additional 20 mg for weigh gain over 2 lbs in 24 hours   GABAPENTIN (NEURONTIN) 300 MG CAPSULE    Take 1 capsule (300 mg total) by mouth 3 (three) times daily.   LOSARTAN (COZAAR) 25 MG TABLET    Take  0.5 tablets (12.5 mg total) by mouth daily.   MULTIPLE VITAMINS-MINERALS (PRESERVISION AREDS) CAPS    Take 1 capsule by mouth 2 (two) times daily.   OXYCODONE-ACETAMINOPHEN (PERCOCET) 7.5-325 MG TABLET    Take 1 tablet by mouth every 8 (eight) hours as needed for severe pain.   POTASSIUM CHLORIDE SA (KLOR-CON M) 20 MEQ TABLET    Take 1 tablet (20 mEq total) by mouth daily.   SIMVASTATIN (ZOCOR) 40 MG TABLET    Take 1 tablet (40 mg total) by mouth daily.   SPIRONOLACTONE (ALDACTONE) 25 MG TABLET    Take 0.5 tablets (12.5 mg total) by mouth daily.  Modified Medications   No medications on file  Discontinued Medications   No medications on file    Subjective: 89 year old male w PMH of CAD s/p CABG, HTN, dyslipidemia, CKD, peripheral neuropathy, s/p biventricular pacemaker 01/24/2022 who is here for HFU for E faecalis bacteremia, presumptive endocarditis  and CIED infection. Patient was discharged 9/7 with plan to complete 6 weeks of IV ampicillin and ceftriaxone, EOT 10/16 to be followed by PO amoxicillin suppression indefinitely.   Patient was seen in the ED 9/18 for fluid overload and left AMA, then admitted 9/25-9/30 for volume overload/acute CHFrEF. He was managed with diuretics, rt sided thoracentesis. TTE with EF 35-40% and discharged after improvement. He was seen by Cardiology for fu on 10/7  09/24/23 Accompanied by his son.  Getting IV abtx from his rt arm PICC without any concerns. Left lateral foot wound has healed. Reports falling down prior to his last admission at [facility]. Reports it did not bother him when hospitalized but has recently noticed a huge bruise in the rt hip and he is planned to get an Xray today. Denies warmth and tenderness. Denies fevers, chills. Denies nausea, vomiting, diarrhea. Denies difficulty walking or standing but has difficulty elevating his rt leg. He is currently on Eliquis, an anticoagulant.  11/05/23 IV abtx completed 10/16 after which started on po amoxicillin  10/17. Taking amoxicillin three times a day without any concerns like nausea, vomiting and diarrhea. No missed doses. Reports  Xray of rt hip negative for fractures. Hematoma has resolved. Last seen by EP 10/20 and Cardiology 11/8, notes reviewed.   Review of Systems: all systems reviewed with pertinent positives and negatives as listed above  Past Medical History:  Diagnosis Date   Bacteremia 08/2023   Bradycardia, unspecified    CAD (coronary artery disease)    a. 2000 s/p CABG x 4 (LIMA->LAD, VG->OM, VG->Diag, VG->RPDA/RPL.   Carotid arterial disease (HCC)    a. 12/2019 U/S: RICA 1-39% bilat.   Chronic HFrEF (heart failure with reduced ejection fraction) (HCC)    a. 08/2023 Echo: EF 35-40%, glob HK, GrIII DD, mildly reduced RV fxn, sev BAE, mod MR, mild-mod TR.   CKD (chronic kidney disease), stage III (HCC)    Hereditary and idiopathic neuropathy, unspecified    History of shingles    Hyperlipidemia    Hypertension    ESSENTIAL PRIMARY   NICM (nonischemic cardiomyopathy) (HCC)    a. 01/2022 s/p SJM 3562, quadra Allure MP CRT-P (LV/RV leads, no RA); b. 08/2023 Echo: EF 35-40%.   Peripheral neuropathy    Persistent atrial fibrillation (HCC)    a. CHA2DS2VASc = 5-->eliquis.   Past Surgical History:  Procedure Laterality Date   BIV PACEMAKER INSERTION CRT-P N/A 01/24/2022   Procedure: BIV PACEMAKER INSERTION CRT-P;  Surgeon: Lanier Prude, MD;  Location: [facility] INVASIVE CV LAB;  Service: Cardiovascular;  Laterality: N/A;   CARDIAC CATHETERIZATION Left 09/1999   NORMAL LEFT MAIN, OCCLUDED LAD, OCCLUDED MID CFX, OCCLUDED PROXIMAL RCA, 60% STENOSIS PROXIMAL DIAG 1, RIGHT TO LEFT COLLATERAL, LEFT TO RIGHT COLLATERAL; LVEF OF 48% DOCUMENTED VIA NUCLEAR STUDY ON 07/12/2009.   CARDIOVERSION N/A 10/05/2019   Procedure: CARDIOVERSION;  Surgeon: Thurmon Fair, MD;  Location: [facility] ENDOSCOPY;  Service: Cardiovascular;  Laterality: N/A;   CATARACT EXTRACTION     CORONARY ARTERY BYPASS GRAFT     w/  LIMA TO LAD, SVG TO dx, SVG TO OM, SVG TO AM-PD-PL 10/12/99 [facility]   REMOVAL OF ANKLE PLATE      Social History   Tobacco Use   Smoking status: Former   Smokeless tobacco: Never  Vaping Use   Vaping status: Never Used  Substance Use Topics   Alcohol use: Never   Drug use: Never    Family History  Problem Relation Age of Onset   Other Mother        MALIGNANT TUMOR OF OVARY   CVA Father     No Known Allergies  Health Maintenance  Topic Date Due   COVID-19 Vaccine (1) Never done   DTaP/Tdap/Td (1 - Tdap) Never done   Zoster Vaccines- Shingrix (1 of 2) Never done   Pneumonia Vaccine 28+ Years old (1 of 1 - PCV) Never done   [employer] (AWV)  08/13/2022  INFLUENZA VACCINE  Never done   HPV VACCINES  Aged Out    Objective: BP 117/74   Pulse 62   Temp 97.9 F (36.6 C) (Temporal)   Resp 16   Wt 162 lb 3.2 oz (73.6 kg)   SpO2 98%   BMI 23.27 kg/m     Physical Exam Constitutional:      Appearance: Normal appearance.  HENT:     Head: Normocephalic and atraumatic.      Mouth: Mucous membranes are moist.  Eyes:    Conjunctiva/sclera: Conjunctivae normal.     Pupils: Pupils are equal, round, and bilaterally symmetrical   Cardiovascular:     Rate and Rhythm: Normal rate and Irregular rhythm.     Heart sounds:    Pulmonary:     Effort: Pulmonary effort is normal.     Breath sounds:   Abdominal:     General: Non distended     Palpations:   Musculoskeletal:        General: Normal range of motion. Rt hip bruise has resolved  Skin:    General: Skin is warm and dry.     Comments: left PPM pocket with no signs of infection   Neurological:     General: grossly non focal     Mental Status: awake, alert and oriented to person, place, and time. Ambulatory   Psychiatric:        Mood and Affect: Mood normal.   Lab Results Lab Results  Component Value Date   WBC 8.5 09/21/2023   HGB 9.5 (L) 09/21/2023   HCT 31.7 (L) 09/21/2023   MCV  99.1 09/21/2023   PLT 157 09/21/2023    Lab Results  Component Value Date   CREATININE 1.60 (H) 09/21/2023   BUN 28 (H) 09/21/2023   NA 135 09/21/2023   K 4.6 09/21/2023   CL 97 (L) 09/21/2023   CO2 27 09/21/2023    Lab Results  Component Value Date   ALT 189 (H) 09/13/2023   AST 114 (H) 09/13/2023   ALKPHOS 43 09/13/2023   BILITOT 0.5 09/13/2023    No results found for: "CHOL", "HDL", "LDLCALC", "LDLDIRECT", "TRIG", "CHOLHDL" No results found for: "LABRPR", "RPRTITER" No results found for: "HIV1RNAQUANT", "HIV1RNAVL", "CD4TABS"   Assessment/Plan 89 year old male w PMH of CAD s/p CABG, HTN, dyslipidemia, peripheral neuropathy, s/p biventricular pacemaker 01/24/2022 who presented to [facility] ED 9/3 with acute onset  fevers chills and generalized weakness for 1 day. Admitted with    # E faecalis bacteremia, presumptive endocarditis as well as CIED infection - s/p 6 weeks of IV ceftriaxone + IV ampicillin. EOT 09/30/23. Started PO amoxicillin 10/17 till date  Plan - continue PO Amoxicillin 500 mg po tid, to be continued indefinitely. He is aware to call clinic if any refills needed.  -Labs today  - Fu in 3 months  - Fu with EP as instructed   # CHFrEF/CAD/Persistent A fib/HTN/HLD - on medications - fu with Cardiology as instructed   # CKD - last Cr was 1.6, baseline 1.2-1.3  # Fall/rt hip bruise - resolved   # PICC  - removed   I have personally spent 40 minutes involved in face-to-face and non-face-to-face activities for this patient on the day of the visit. Professional time spent includes the following activities: Preparing to see the patient (review of tests), Obtaining and/or reviewing separately obtained history (admission/discharge record), Performing a medically appropriate examination and/or evaluation , Ordering medications/tests/procedures, referring and communicating with other health care professionals,  Documenting clinical information in the EMR, Independently  interpreting results (not separately reported), Communicating results to the patient/family/caregiver, Counseling and educating the patient/family/caregiver and Care coordination (not separately reported).   Victoriano Lain, MD [facility] 11/05/2023, 9:05 AM

## 2023-11-06 LAB — CUP PACEART REMOTE DEVICE CHECK
Battery Remaining Longevity: 76 mo
Battery Remaining Percentage: 82 %
Battery Voltage: 3.01 V
Date Time Interrogation Session: 20241120015010
Implantable Lead Connection Status: 753985
Implantable Lead Connection Status: 753985
Implantable Lead Implant Date: 20230210
Implantable Lead Implant Date: 20230210
Implantable Lead Location: 753858
Implantable Lead Location: 753860
Implantable Pulse Generator Implant Date: 20230210
Lead Channel Impedance Value: 400 Ohm
Lead Channel Impedance Value: 440 Ohm
Lead Channel Pacing Threshold Amplitude: 0.5 V
Lead Channel Pacing Threshold Amplitude: 1 V
Lead Channel Pacing Threshold Pulse Width: 0.5 ms
Lead Channel Pacing Threshold Pulse Width: 0.5 ms
Lead Channel Sensing Intrinsic Amplitude: 12 mV
Lead Channel Setting Pacing Amplitude: 2.25 V
Lead Channel Setting Pacing Amplitude: 2.5 V
Lead Channel Setting Pacing Pulse Width: 0.5 ms
Lead Channel Setting Pacing Pulse Width: 0.5 ms
Lead Channel Setting Sensing Sensitivity: 4 mV
Pulse Gen Model: 3562
Pulse Gen Serial Number: 3992207

## 2023-11-06 LAB — BASIC METABOLIC PANEL
BUN/Creatinine Ratio: 28 (calc) — ABNORMAL HIGH (ref 6–22)
BUN: 36 mg/dL — ABNORMAL HIGH (ref 7–25)
CO2: 29 mmol/L (ref 20–32)
Calcium: 8.3 mg/dL — ABNORMAL LOW (ref 8.6–10.3)
Chloride: 101 mmol/L (ref 98–110)
Creat: 1.28 mg/dL — ABNORMAL HIGH (ref 0.70–1.22)
Glucose, Bld: 101 mg/dL — ABNORMAL HIGH (ref 65–99)
Potassium: 4.7 mmol/L (ref 3.5–5.3)
Sodium: 141 mmol/L (ref 135–146)

## 2023-11-06 LAB — CBC
HCT: 34.6 % — ABNORMAL LOW (ref 38.5–50.0)
Hemoglobin: 10.8 g/dL — ABNORMAL LOW (ref 13.2–17.1)
MCH: 30 pg (ref 27.0–33.0)
MCHC: 31.2 g/dL — ABNORMAL LOW (ref 32.0–36.0)
MCV: 96.1 fL (ref 80.0–100.0)
MPV: 10.8 fL (ref 7.5–12.5)
Platelets: 166 10*3/uL (ref 140–400)
RBC: 3.6 10*6/uL — ABNORMAL LOW (ref 4.20–5.80)
RDW: 12.2 % (ref 11.0–15.0)
WBC: 7.6 10*3/uL (ref 3.8–10.8)

## 2023-11-30 NOTE — Progress Notes (Signed)
Remote ICD transmission.   

## 2023-11-30 NOTE — Addendum Note (Signed)
Addended by: Geralyn Flash D on: 11/30/2023 11:29 AM   Modules accepted: Orders

## 2023-12-03 ENCOUNTER — Other Ambulatory Visit: Payer: Self-pay | Admitting: Cardiology

## 2023-12-03 MED ORDER — EZETIMIBE 10 MG PO TABS: ORAL_TABLET | ORAL | 0 refills | Status: DC

## 2023-12-07 ENCOUNTER — Ambulatory Visit: Payer: Medicare PPO | Attending: Cardiology

## 2023-12-07 DIAGNOSIS — Z95810 Presence of automatic (implantable) cardiac defibrillator: Secondary | ICD-10-CM

## 2023-12-07 DIAGNOSIS — I5042 Chronic combined systolic (congestive) and diastolic (congestive) heart failure: Secondary | ICD-10-CM | POA: Diagnosis not present

## 2023-12-10 NOTE — Progress Notes (Signed)
EPIC Encounter for ICM Monitoring  Patient Name: Kevin Collins is a 89 y.o. male Date: 12/10/2023 Primary Care Physican: Toma Deiters, MD Primary Cardiologist: Hochrein Electrophysiologist: Townsend Roger Pacing: 89%            05/14/2023 Weight: 155 lbs  07/22/2023 Weight: 154 lbs 09/30/2023 Weight:  155 lbs (baseline)           10/07/2023 Weight: 153 lbs      11/03/2023 Weight: 155 lbs      12/10/2023 Weight: 160 lbs                                       Spoke with patient and heart failure questions reviewed.  Transmission results reviewed.  Pt asymptomatic for fluid accumulation.  Reports feeling well at this time and voices no complaints.   He reports he has is taking Lasix 40 mg x 5 days week because he has some swelling around his ankles when he takes his socks off.    CorVue thoracic impedance suggesting normal fluid levels within the last month.   Prescribed:  Furosemide 40 mg take 0.5 tablet(s) (20 mg total) by mouth daily (started 10/11). May take an additional 20 mg for weight gain over 2 lbs in 24 hours  Potassium 20 mEq take 1 tablet by mouth daily Spironolactone 25 mg take 0.5 tablet (12.5 mg total) by mouth daily.                                              Labs: 09/21/2023 Creatinine 1.60, BUN 28, Potassium 4.6, Sodium 135, GFR 40  09/14/2023 Creatinine 1.24, BUN 17, Potassium 3.4, Sodium 134, GFR 55  09/13/2023 Creatinine 1.26, BUN 20, Potassium 3.0, Sodium 136, GFR 54  09/12/2023 Creatinine 1.39, BUN 21, Potassium 3.3, Sodium 137, GFR 48 09/11/2023 Creatinine 1.48, BUN 19, Potassium 3.4, Sodium 138, GFR 44  A complete set of results can be found in Results Review.   Recommendations:  No changes and encouraged to call if experiencing any fluid symptoms.   Follow-up plan: ICM clinic phone appointment on 01/11/2024.   91 day device clinic remote transmission 02/02/2024.     EP/Cardiology Office Visits:   Recall 10/08/2024 with Dr. Lalla Brothers or APP.   02/24/2024 with Dr  Antoine Poche.     Copy of ICM check sent to Dr. Lalla Brothers.  3 month ICM trend: 12/07/2023.    12-14 Month ICM trend:     Karie Soda, RN 12/10/2023 2:14 PM

## 2023-12-31 ENCOUNTER — Encounter: Payer: Self-pay | Admitting: Cardiology

## 2024-01-11 ENCOUNTER — Ambulatory Visit: Payer: Medicare PPO | Attending: Cardiology

## 2024-01-11 DIAGNOSIS — I5042 Chronic combined systolic (congestive) and diastolic (congestive) heart failure: Secondary | ICD-10-CM | POA: Diagnosis not present

## 2024-01-11 DIAGNOSIS — Z95810 Presence of automatic (implantable) cardiac defibrillator: Secondary | ICD-10-CM | POA: Diagnosis not present

## 2024-01-12 DIAGNOSIS — M1009 Idiopathic gout, multiple sites: Secondary | ICD-10-CM | POA: Diagnosis not present

## 2024-01-12 DIAGNOSIS — Z6824 Body mass index (BMI) 24.0-24.9, adult: Secondary | ICD-10-CM | POA: Diagnosis not present

## 2024-01-12 DIAGNOSIS — E876 Hypokalemia: Secondary | ICD-10-CM | POA: Diagnosis not present

## 2024-01-12 DIAGNOSIS — N1832 Chronic kidney disease, stage 3b: Secondary | ICD-10-CM | POA: Diagnosis not present

## 2024-01-12 DIAGNOSIS — I5032 Chronic diastolic (congestive) heart failure: Secondary | ICD-10-CM | POA: Diagnosis not present

## 2024-01-12 DIAGNOSIS — I480 Paroxysmal atrial fibrillation: Secondary | ICD-10-CM | POA: Diagnosis not present

## 2024-01-12 DIAGNOSIS — I10 Essential (primary) hypertension: Secondary | ICD-10-CM | POA: Diagnosis not present

## 2024-01-12 DIAGNOSIS — Z0000 Encounter for general adult medical examination without abnormal findings: Secondary | ICD-10-CM | POA: Diagnosis not present

## 2024-01-13 NOTE — Progress Notes (Signed)
EPIC Encounter for ICM Monitoring  Patient Name: Cash Duce is a 89 y.o. male Date: 01/13/2024 Primary Care Physican: Toma Deiters, MD Primary Cardiologist: Hochrein Electrophysiologist: Townsend Roger Pacing: 89%            05/14/2023 Weight: 155 lbs  07/22/2023 Weight: 154 lbs 09/30/2023 Weight:  155 lbs (baseline)           10/07/2023 Weight: 153 lbs      11/03/2023 Weight: 155 lbs      12/10/2023 Weight: 160 lbs                                       Spoke with patient and heart failure questions reviewed.  Transmission results reviewed.  Pt asymptomatic for fluid accumulation.  He has a lot of pain in his feet.     CorVue thoracic impedance suggesting normal fluid levels within the last month.   Prescribed:  Furosemide 40 mg take 0.5 tablet(s) (20 mg total) by mouth daily (started 10/11). May take an additional 20 mg for weight gain over 2 lbs in 24 hours  Potassium 20 mEq take 1 tablet by mouth daily Spironolactone 25 mg take 0.5 tablet (12.5 mg total) by mouth daily.                                              Labs: 11/05/2023 Creatinine 1.28, BUN 36, Potassium 4.7, Sodium 141 09/21/2023 Creatinine 1.60, BUN 28, Potassium 4.6, Sodium 135, GFR 40  09/14/2023 Creatinine 1.24, BUN 17, Potassium 3.4, Sodium 134, GFR 55  09/13/2023 Creatinine 1.26, BUN 20, Potassium 3.0, Sodium 136, GFR 54  09/12/2023 Creatinine 1.39, BUN 21, Potassium 3.3, Sodium 137, GFR 48 09/11/2023 Creatinine 1.48, BUN 19, Potassium 3.4, Sodium 138, GFR 44  A complete set of results can be found in Results Review.   Recommendations:  No changes and encouraged to call if experiencing any fluid symptoms.   Follow-up plan: ICM clinic phone appointment on 02/15/2024.   91 day device clinic remote transmission 02/02/2024.     EP/Cardiology Office Visits:   Recall 10/08/2024 with Dr. Lalla Brothers or APP.   02/24/2024 with Dr Antoine Poche.     Copy of ICM check sent to Dr. Lalla Brothers.  3 month ICM trend:  01/11/2024.    12-14 Month ICM trend:     Karie Soda, RN 01/13/2024 8:59 AM

## 2024-02-02 ENCOUNTER — Ambulatory Visit (INDEPENDENT_AMBULATORY_CARE_PROVIDER_SITE_OTHER): Payer: TRICARE For Life (TFL)

## 2024-02-02 DIAGNOSIS — R001 Bradycardia, unspecified: Secondary | ICD-10-CM | POA: Diagnosis not present

## 2024-02-03 ENCOUNTER — Encounter: Payer: Self-pay | Admitting: Cardiology

## 2024-02-03 LAB — CUP PACEART REMOTE DEVICE CHECK
Battery Remaining Longevity: 75 mo
Battery Remaining Percentage: 79 %
Battery Voltage: 3.01 V
Date Time Interrogation Session: 20250218040010
Implantable Lead Connection Status: 753985
Implantable Lead Connection Status: 753985
Implantable Lead Implant Date: 20230210
Implantable Lead Implant Date: 20230210
Implantable Lead Location: 753858
Implantable Lead Location: 753860
Implantable Pulse Generator Implant Date: 20230210
Lead Channel Impedance Value: 440 Ohm
Lead Channel Impedance Value: 490 Ohm
Lead Channel Pacing Threshold Amplitude: 0.5 V
Lead Channel Pacing Threshold Amplitude: 1 V
Lead Channel Pacing Threshold Pulse Width: 0.5 ms
Lead Channel Pacing Threshold Pulse Width: 0.5 ms
Lead Channel Sensing Intrinsic Amplitude: 12 mV
Lead Channel Setting Pacing Amplitude: 2.25 V
Lead Channel Setting Pacing Amplitude: 2.5 V
Lead Channel Setting Pacing Pulse Width: 0.5 ms
Lead Channel Setting Pacing Pulse Width: 0.5 ms
Lead Channel Setting Sensing Sensitivity: 4 mV
Pulse Gen Model: 3562
Pulse Gen Serial Number: 3992207

## 2024-02-04 ENCOUNTER — Ambulatory Visit: Payer: Medicare PPO | Admitting: Infectious Diseases

## 2024-02-11 ENCOUNTER — Ambulatory Visit: Payer: Medicare PPO | Admitting: Infectious Diseases

## 2024-02-15 ENCOUNTER — Ambulatory Visit: Payer: Medicare PPO | Attending: Cardiology

## 2024-02-15 DIAGNOSIS — I5042 Chronic combined systolic (congestive) and diastolic (congestive) heart failure: Secondary | ICD-10-CM | POA: Diagnosis not present

## 2024-02-15 DIAGNOSIS — Z95810 Presence of automatic (implantable) cardiac defibrillator: Secondary | ICD-10-CM

## 2024-02-17 ENCOUNTER — Other Ambulatory Visit: Payer: Self-pay | Admitting: Cardiology

## 2024-02-17 ENCOUNTER — Telehealth: Payer: Self-pay

## 2024-02-17 NOTE — Telephone Encounter (Signed)
 Remote ICM transmission received.  Attempted call to patient regarding ICM remote transmission and left detailed message per DPR.  Left ICM phone number and advised to return call for any fluid symptoms or questions. Next ICM remote transmission scheduled 03/21/2024.

## 2024-02-17 NOTE — Progress Notes (Signed)
 EPIC Encounter for ICM Monitoring  Patient Name: Kevin Collins is a 89 y.o. male Date: 02/17/2024 Primary Care Physican: Toma Deiters, MD Primary Cardiologist: Hochrein Electrophysiologist: Townsend Roger Pacing: 92%            05/14/2023 Weight: 155 lbs  07/22/2023 Weight: 154 lbs 09/30/2023 Weight:  155 lbs (baseline)           10/07/2023 Weight: 153 lbs      11/03/2023 Weight: 155 lbs      12/10/2023 Weight: 160 lbs                                       Attempted call to patient and unable to reach.  Left detailed message per DPR regarding transmission.  Transmission results reviewed.    CorVue thoracic impedance suggesting normal fluid levels within the last month.   Prescribed:  Furosemide 40 mg take 0.5 tablet(s) (20 mg total) by mouth daily (started 10/11). May take an additional 20 mg for weight gain over 2 lbs in 24 hours  Potassium 20 mEq take 1 tablet by mouth daily Spironolactone 25 mg take 0.5 tablet (12.5 mg total) by mouth daily.                                              Labs: 11/05/2023 Creatinine 1.28, BUN 36, Potassium 4.7, Sodium 141 09/21/2023 Creatinine 1.60, BUN 28, Potassium 4.6, Sodium 135, GFR 40  09/14/2023 Creatinine 1.24, BUN 17, Potassium 3.4, Sodium 134, GFR 55  09/13/2023 Creatinine 1.26, BUN 20, Potassium 3.0, Sodium 136, GFR 54  09/12/2023 Creatinine 1.39, BUN 21, Potassium 3.3, Sodium 137, GFR 48 09/11/2023 Creatinine 1.48, BUN 19, Potassium 3.4, Sodium 138, GFR 44  A complete set of results can be found in Results Review.   Recommendations: Left voice mail with ICM number and encouraged to call if experiencing any fluid symptoms.   Follow-up plan: ICM clinic phone appointment on 03/21/2024.   91 day device clinic remote transmission 05/03/2024.     EP/Cardiology Office Visits:   Recall 10/08/2024 with Dr. Lalla Brothers or APP.   02/24/2024 with Dr Antoine Poche.     Copy of ICM check sent to Dr. Lalla Brothers.  3 month ICM trend: 02/15/2024.    12-14 Month ICM  trend:     Karie Soda, RN 02/17/2024 1:42 PM

## 2024-02-18 ENCOUNTER — Ambulatory Visit: Payer: Medicare PPO | Admitting: Infectious Diseases

## 2024-02-18 ENCOUNTER — Encounter: Payer: Self-pay | Admitting: Infectious Diseases

## 2024-02-18 ENCOUNTER — Other Ambulatory Visit: Payer: Self-pay

## 2024-02-18 VITALS — BP 129/72 | HR 62 | Temp 97.7°F | Wt 159.0 lb

## 2024-02-18 DIAGNOSIS — B952 Enterococcus as the cause of diseases classified elsewhere: Secondary | ICD-10-CM | POA: Diagnosis not present

## 2024-02-18 DIAGNOSIS — R7881 Bacteremia: Secondary | ICD-10-CM

## 2024-02-18 DIAGNOSIS — T827XXD Infection and inflammatory reaction due to other cardiac and vascular devices, implants and grafts, subsequent encounter: Secondary | ICD-10-CM | POA: Diagnosis not present

## 2024-02-18 DIAGNOSIS — Z79899 Other long term (current) drug therapy: Secondary | ICD-10-CM

## 2024-02-18 DIAGNOSIS — A491 Streptococcal infection, unspecified site: Secondary | ICD-10-CM

## 2024-02-18 DIAGNOSIS — L84 Corns and callosities: Secondary | ICD-10-CM | POA: Diagnosis not present

## 2024-02-18 MED ORDER — AMOXICILLIN 500 MG PO CAPS: 500.0000 mg | ORAL_CAPSULE | Freq: Three times a day (TID) | ORAL | 3 refills | Status: DC

## 2024-02-18 NOTE — Progress Notes (Signed)
 Patient Active Problem List   Diagnosis Date Noted   PICC (peripherally inserted central catheter) removal 11/05/2023   Pacemaker infection (HCC) 09/25/2023   Fall 09/25/2023   Bacteremia due to Enterococcus 09/25/2023   Transaminitis 09/10/2023   AKI (acute kidney injury) (HCC) 09/10/2023   CHF (congestive heart failure) (HCC) 09/09/2023   Pain in left foot 08/21/2023   Peripheral neuropathy 08/19/2023   Paroxysmal atrial fibrillation (HCC) 08/19/2023   Sepsis due to undetermined organism (HCC) 08/18/2023   Symptomatic bradycardia 01/24/2022   Bradycardia 05/07/2020   Nonrheumatic aortic valve insufficiency 05/07/2020   Acute on chronic combined systolic and diastolic HF (heart failure) (HCC) 05/07/2020   Alcoholic cardiomyopathy (HCC) 40/98/1191   Acute heart failure (HCC) 08/31/2019   Medication management 08/31/2019   Atrial fibrillation (HCC) 08/31/2019   Coronary artery disease involving native coronary artery of native heart without angina pectoris 05/25/2019   Essential hypertension 05/25/2019   Dyslipidemia 05/25/2019   Educated about COVID-19 virus infection 05/25/2019    Patient's Medications  New Prescriptions   No medications on file  Previous Medications   AMOXICILLIN (AMOXIL) 500 MG TABLET    Take 500 mg by mouth in the morning, at noon, and at bedtime.   APIXABAN (ELIQUIS) 2.5 MG TABS TABLET    Take 1 tablet (2.5 mg total) by mouth 2 (two) times daily.   BIMATOPROST (LUMIGAN) 0.01 % SOLN    Place 1 drop into both eyes at bedtime.   CINACALCET (SENSIPAR) 90 MG TABLET    Take 90 mg by mouth daily.   EZETIMIBE (ZETIA) 10 MG TABLET    TAKE 1 TABLET DAILY (NEED APPOINTMENT FOR FUTURE REFILLS)   FUROSEMIDE (LASIX) 20 MG TABLET    Take 1 tablet (20 mg total) by mouth daily. May take an additional 20 mg for weigh gain over 2 lbs in 24 hours   GABAPENTIN (NEURONTIN) 300 MG CAPSULE    Take 1 capsule (300 mg total) by mouth 3 (three) times daily.   LOSARTAN (COZAAR)  25 MG TABLET    Take 0.5 tablets (12.5 mg total) by mouth daily.   MULTIPLE VITAMINS-MINERALS (PRESERVISION AREDS) CAPS    Take 1 capsule by mouth 2 (two) times daily.   OXYCODONE-ACETAMINOPHEN (PERCOCET) 7.5-325 MG TABLET    Take 1 tablet by mouth every 8 (eight) hours as needed for severe pain.   POTASSIUM CHLORIDE SA (KLOR-CON M) 20 MEQ TABLET    Take 1 tablet (20 mEq total) by mouth daily.   SIMVASTATIN (ZOCOR) 40 MG TABLET    Take 1 tablet (40 mg total) by mouth daily.   SPIRONOLACTONE (ALDACTONE) 25 MG TABLET    Take 0.5 tablets (12.5 mg total) by mouth daily.  Modified Medications   No medications on file  Discontinued Medications   No medications on file    Subjective: 89 year old male w PMH of CAD s/p CABG, HTN, dyslipidemia, CKD, peripheral neuropathy, s/p biventricular pacemaker 01/24/2022 who is here for HFU for E faecalis bacteremia, presumptive endocarditis  and CIED infection. Patient was discharged 9/7 with plan to complete 6 weeks of IV ampicillin and ceftriaxone, EOT 10/16 to be followed by PO amoxicillin suppression indefinitely.   Patient was seen in the ED 9/18 for fluid overload and left AMA, then admitted 9/25-9/30 for volume overload/acute CHFrEF. He was managed with diuretics, rt sided thoracentesis. TTE with EF 35-40% and discharged after improvement. He was seen by Cardiology for fu on 10/7  IV abtx completed 10/16  after which started on po amoxicillin 10/17.  3/6 He came by himself. Taking amoxicillin without missing any doses or concerns. He has a callus in his left foot in the plantar aspect but denies pain, tenderness or swelling; reports trimmed by PCP recently. He would like to get 90 days prescriptions of [facility]. He reports he has a fu with Cardiology next Wednesday. No other concerns.   Review of Systems: All systems reviewed with pertinent positives and negatives as listed above  Past Medical History:  Diagnosis Date    Bacteremia 08/2023   Bradycardia, unspecified    CAD (coronary artery disease)    a. 2000 s/p CABG x 4 (LIMA->LAD, VG->OM, VG->Diag, VG->RPDA/RPL.   Carotid arterial disease (HCC)    a. 12/2019 U/S: RICA 1-39% bilat.   Chronic HFrEF (heart failure with reduced ejection fraction) (HCC)    a. 08/2023 Echo: EF 35-40%, glob HK, GrIII DD, mildly reduced RV fxn, sev BAE, mod MR, mild-mod TR.   CKD (chronic kidney disease), stage III (HCC)    Hereditary and idiopathic neuropathy, unspecified    History of shingles    Hyperlipidemia    Hypertension    ESSENTIAL PRIMARY   NICM (nonischemic cardiomyopathy) (HCC)    a. 01/2022 s/p SJM 3562, quadra Allure MP CRT-P (LV/RV leads, no RA); b. 08/2023 Echo: EF 35-40%.   Peripheral neuropathy    Persistent atrial fibrillation (HCC)    a. CHA2DS2VASc = 5-->eliquis.   Past Surgical History:  Procedure Laterality Date   BIV PACEMAKER INSERTION CRT-P N/A 01/24/2022   Procedure: BIV PACEMAKER INSERTION CRT-P;  Surgeon: Lanier Prude, MD;  Location: [facility] INVASIVE CV LAB;  Service: Cardiovascular;  Laterality: N/A;   CARDIAC CATHETERIZATION Left 09/1999   NORMAL LEFT MAIN, OCCLUDED LAD, OCCLUDED MID CFX, OCCLUDED PROXIMAL RCA, 60% STENOSIS PROXIMAL DIAG 1, RIGHT TO LEFT COLLATERAL, LEFT TO RIGHT COLLATERAL; LVEF OF 48% DOCUMENTED VIA NUCLEAR STUDY ON 07/12/2009.   CARDIOVERSION N/A 10/05/2019   Procedure: CARDIOVERSION;  Surgeon: Thurmon Fair, MD;  Location: [facility] ENDOSCOPY;  Service: Cardiovascular;  Laterality: N/A;   CATARACT EXTRACTION     CORONARY ARTERY BYPASS GRAFT     w/ LIMA TO LAD, SVG TO dx, SVG TO OM, SVG TO AM-PD-PL 10/12/99 [facility]   REMOVAL OF ANKLE PLATE      Social History   Tobacco Use   Smoking status: Former   Smokeless tobacco: Never  Vaping Use   Vaping status: Never Used  Substance Use Topics   Alcohol use: Never   Drug use: Never    Family History  Problem Relation Age of Onset   Other Mother        MALIGNANT TUMOR OF  OVARY   CVA Father     No Known Allergies  Health Maintenance  Topic Date Due   COVID-19 Vaccine (1) Never done   Pneumonia Vaccine 38+ Years old (1 of 2 - PCV) Never done   DTaP/Tdap/Td (1 - Tdap) Never done   Zoster Vaccines- Shingrix (1 of 2) Never done   INFLUENZA VACCINE  Never done   Medicare Annual Wellness (AWV)  05/25/2024   HPV VACCINES  Aged Out    Objective: BP 129/72   Pulse 62   Temp 97.7 F (36.5 C) (Oral)   Wt 159 lb (72.1 kg)   SpO2 97%   BMI 22.81 kg/m     Physical Exam Constitutional:      Appearance: Normal appearance.  HENT:  Head: Normocephalic and atraumatic.      Mouth: Mucous membranes are moist.  Eyes:    Conjunctiva/sclera: Conjunctivae normal.     Pupils: Pupils are equal, round, and bilaterally symmetrical   Cardiovascular:     Rate and Rhythm: Normal rate and Irregular rhythm.     Heart sounds:    Pulmonary:     Effort: Pulmonary effort is normal.     Breath sounds:   Abdominal:     General: Non distended     Palpations:   Musculoskeletal:        General: Normal range of motion. Left plantar foot callus, no signs of infection   Skin:    General: Skin is warm and dry.     Comments: left PPM pocket with no signs of infection   Neurological:     General: grossly non focal     Mental Status: awake, alert and oriented to person, place, and time. Ambulatory   Psychiatric:        Mood and Affect: Mood normal.   Lab Results Lab Results  Component Value Date   WBC 7.6 11/05/2023   HGB 10.8 (L) 11/05/2023   HCT 34.6 (L) 11/05/2023   MCV 96.1 11/05/2023   PLT 166 11/05/2023    Lab Results  Component Value Date   CREATININE 1.28 (H) 11/05/2023   BUN 36 (H) 11/05/2023   NA 141 11/05/2023   K 4.7 11/05/2023   CL 101 11/05/2023   CO2 29 11/05/2023    Lab Results  Component Value Date   ALT 189 (H) 09/13/2023   AST 114 (H) 09/13/2023   ALKPHOS 43 09/13/2023   BILITOT 0.5 09/13/2023    No results found for:  "CHOL", "HDL", "LDLCALC", "LDLDIRECT", "TRIG", "CHOLHDL" No results found for: "LABRPR", "RPRTITER" No results found for: "HIV1RNAQUANT", "HIV1RNAVL", "CD4TABS"   Assessment/Plan 89 year old male w PMH of CAD s/p CABG, HTN, dyslipidemia, peripheral neuropathy, s/p biventricular pacemaker 01/24/2022 who presented to [facility] ED 9/3 with acute onset  fevers chills and generalized weakness for 1 day. Admitted with    # E faecalis bacteremia, presumptive endocarditis as well as CIED infection - s/p 6 weeks of IV ceftriaxone + IV ampicillin. EOT 09/30/23. Started PO amoxicillin 10/17 till date  Plan - continue PO Amoxicillin 500 mg po tid, to be continued indefinitely. Refills sent - Labs today  - Fu in 4 months   - Fu with EP as instructed   # Left foot callus  - discussed wound care, no indication for antibiotics  # CHFrEF/CAD/Persistent A fib/HTN/HLD - on medications - fu with Cardiology as instructed   I have personally spent 30 minutes involved in face-to-face and non-face-to-face activities for this patient on the day of the visit. Professional time spent includes the following activities: Preparing to see the patient (review of tests), Obtaining and/or reviewing separately obtained history (admission/discharge record), Performing a medically appropriate examination and/or evaluation , Ordering medications/tests/procedures, referring and communicating with other health care professionals, Documenting clinical information in the EMR, Independently interpreting results (not separately reported), Communicating results to the patient/family/caregiver, Counseling and educating the patient/family/caregiver and Care coordination (not separately reported).   Victoriano Lain, MD [facility] 02/18/2024, 9:03 AM

## 2024-02-19 ENCOUNTER — Telehealth: Payer: Self-pay

## 2024-02-19 LAB — BASIC METABOLIC PANEL
BUN/Creatinine Ratio: 21 (calc) (ref 6–22)
BUN: 30 mg/dL — ABNORMAL HIGH (ref 7–25)
CO2: 31 mmol/L (ref 20–32)
Calcium: 12 mg/dL — ABNORMAL HIGH (ref 8.6–10.3)
Chloride: 103 mmol/L (ref 98–110)
Creat: 1.46 mg/dL — ABNORMAL HIGH (ref 0.70–1.22)
Glucose, Bld: 102 mg/dL — ABNORMAL HIGH (ref 65–99)
Potassium: 4.7 mmol/L (ref 3.5–5.3)
Sodium: 142 mmol/L (ref 135–146)

## 2024-02-19 LAB — CBC
HCT: 40.3 % (ref 38.5–50.0)
Hemoglobin: 12.5 g/dL — ABNORMAL LOW (ref 13.2–17.1)
MCH: 28.9 pg (ref 27.0–33.0)
MCHC: 31 g/dL — ABNORMAL LOW (ref 32.0–36.0)
MCV: 93.1 fL (ref 80.0–100.0)
MPV: 10.7 fL (ref 7.5–12.5)
Platelets: 174 10*3/uL (ref 140–400)
RBC: 4.33 10*6/uL (ref 4.20–5.80)
RDW: 13.1 % (ref 11.0–15.0)
WBC: 7.3 10*3/uL (ref 3.8–10.8)

## 2024-02-19 NOTE — Telephone Encounter (Signed)
-----   Message from Victoriano Lain sent at 02/19/2024  8:50 AM EST ----- Please let patient know lab work is negative for any acute abnormality. Hb has improved from 10.8 to 12, Cr is 1.46 which seems to be within his baseline range. Recommend to fu with PCP.

## 2024-02-20 DIAGNOSIS — A491 Streptococcal infection, unspecified site: Secondary | ICD-10-CM | POA: Insufficient documentation

## 2024-02-20 DIAGNOSIS — L84 Corns and callosities: Secondary | ICD-10-CM | POA: Insufficient documentation

## 2024-02-21 NOTE — Progress Notes (Unsigned)
  Cardiology Office Note:   Date:  02/21/2024  ID:  Kevin Collins, DOB 09/08/1930, MRN [id number] PCP: Toma Deiters, MD  [facility] Providers Cardiologist:  Rollene Rotunda, MD Electrophysiologist:  Lanier Prude, MD {  History of Present Illness:   Kevin Collins is a 89 y.o. male with a history of CAD s/p CABG in 10/1999 (LIMA-LAD, SVG-OM, SVG-diagonal, SVG-PDA/PL), history of bradycardia, atrial fib, HTN, HLD and peripheral neuropathy and EF 40 to 45%.  He has had cardioversion in 09/2019.  However, he went back into atrial fib.  he underwent successful implantation of a CRT-P BiV on 01/24/2022.   This was for symptomatic bradycardia.    Since he was last seen ***    ***He says he probably has felt little better since he had this device.  His only complaint is that he occasionally gets dizzy when he bends over and has to stand up quickly.  He denies any palpitations.  He is not having any chest pressure, neck or arm discomfort.  He has had no new shortness of breath, PND or orthopnea.  He had no weight gain or edema.  ROS: ***  Studies Reviewed:    EKG:       ***  Risk Assessment/Calculations:   {Does this patient have ATRIAL FIBRILLATION?[phone]} No BP recorded.  {Refresh Note OR Click here to enter BP  :1}***        Physical Exam:   VS:  There were no vitals taken for this visit.   Wt Readings from Last 3 Encounters:  02/18/24 159 lb (72.1 kg)  11/05/23 162 lb 3.2 oz (73.6 kg)  10/23/23 162 lb 6.4 oz (73.7 kg)     GEN: Well nourished, well developed in no acute distress NECK: No JVD; No carotid bruits CARDIAC: ***RR, *** murmurs, rubs, gallops RESPIRATORY:  Clear to auscultation without rales, wheezing or rhonchi  ABDOMEN: Soft, non-tender, non-distended EXTREMITIES:  No edema; No deformity   ASSESSMENT AND PLAN:   Persistent atrial fibrillation:    Mr. Kevin Collins has a CHA2DS2 - VASc score of 4 he tolerates anticoagulation.  ***No change in therapy.    Bradycardia/PPM: He has a history of BiV PPM and is up to date with follow up in Feb 2025.  I reviewed this report for this visit. *** He is now status post pacemaker placement as above.  He will follow in the device clinic.   CAD s/p CABG:    *** He has no new anginal symptoms.  He will continue with risk reduction.   Hypertension:   The blood pressure is ***  low.  I am going to reduce his Lasix to 20 mg daily.  He will let me know if he has any increasing dizziness and I might have to back off on his lisinopril.    Chronic systolic and diastolic heart failure:   He had normal impedence on device interrogation March 2025.  I reviewed this reading for this visit.  ***   Again I will make the changes as above.  He will resume the extra Lasix if he starts getting short of breath.    AI:  This was mild in 2020     Hyperlipidemia:   LDL was 50 with an HDL of 52.  No change in therapy.      Follow up ***  Signed, Rollene Rotunda, MD

## 2024-02-22 DIAGNOSIS — I10 Essential (primary) hypertension: Secondary | ICD-10-CM | POA: Diagnosis not present

## 2024-02-22 DIAGNOSIS — Z6824 Body mass index (BMI) 24.0-24.9, adult: Secondary | ICD-10-CM | POA: Diagnosis not present

## 2024-02-22 DIAGNOSIS — L84 Corns and callosities: Secondary | ICD-10-CM | POA: Diagnosis not present

## 2024-02-22 DIAGNOSIS — L0291 Cutaneous abscess, unspecified: Secondary | ICD-10-CM | POA: Diagnosis not present

## 2024-02-24 ENCOUNTER — Ambulatory Visit (INDEPENDENT_AMBULATORY_CARE_PROVIDER_SITE_OTHER): Payer: Medicare PPO | Admitting: Cardiology

## 2024-02-24 ENCOUNTER — Encounter: Payer: Self-pay | Admitting: Cardiology

## 2024-02-24 VITALS — BP 96/54 | HR 60 | Ht 70.0 in | Wt 162.0 lb

## 2024-02-24 DIAGNOSIS — I2510 Atherosclerotic heart disease of native coronary artery without angina pectoris: Secondary | ICD-10-CM | POA: Diagnosis not present

## 2024-02-24 DIAGNOSIS — I10 Essential (primary) hypertension: Secondary | ICD-10-CM | POA: Diagnosis not present

## 2024-02-24 DIAGNOSIS — E785 Hyperlipidemia, unspecified: Secondary | ICD-10-CM

## 2024-02-24 DIAGNOSIS — R001 Bradycardia, unspecified: Secondary | ICD-10-CM | POA: Diagnosis not present

## 2024-02-24 DIAGNOSIS — I5042 Chronic combined systolic (congestive) and diastolic (congestive) heart failure: Secondary | ICD-10-CM | POA: Diagnosis not present

## 2024-02-24 NOTE — Patient Instructions (Signed)
Medication Instructions:  The current medical regimen is effective;  continue present plan and medications.  *If you need a refill on your cardiac medications before your next appointment, please call your pharmacy*  Follow-Up: At [facility], you and your health needs are our priority.  As part of our continuing mission to provide you with exceptional heart care, we have created designated Provider Care Teams.  These Care Teams include your primary Cardiologist (physician) and Advanced Practice Providers (APPs -  Physician Assistants and Nurse Practitioners) who all work together to provide you with the care you need, when you need it.  We recommend signing up for the patient portal called "MyChart".  Sign up information is provided on this After Visit Summary.  MyChart is used to connect with patients for Virtual Visits (Telemedicine).  Patients are able to view lab/test results, encounter notes, upcoming appointments, etc.  Non-urgent messages can be sent to your provider as well.   To learn more about what you can do with MyChart, go to [email or web address].    Your next appointment:   6 month(s)  Provider:   James Hochrein, MD    

## 2024-03-05 ENCOUNTER — Encounter: Payer: Self-pay | Admitting: Cardiology

## 2024-03-07 MED ORDER — EZETIMIBE 10 MG PO TABS: 10.0000 mg | ORAL_TABLET | Freq: Every day | ORAL | 3 refills | Status: AC

## 2024-03-10 NOTE — Progress Notes (Signed)
 Remote pacemaker transmission.

## 2024-03-10 NOTE — Addendum Note (Signed)
 Addended by: Elease Etienne A on: 03/10/2024 09:08 AM   Modules accepted: Orders

## 2024-03-21 ENCOUNTER — Ambulatory Visit: Attending: Cardiology

## 2024-03-21 DIAGNOSIS — Z95810 Presence of automatic (implantable) cardiac defibrillator: Secondary | ICD-10-CM

## 2024-03-21 DIAGNOSIS — I5042 Chronic combined systolic (congestive) and diastolic (congestive) heart failure: Secondary | ICD-10-CM

## 2024-03-22 NOTE — Progress Notes (Signed)
 EPIC Encounter for ICM Monitoring  Patient Name: Kevin Collins is a 89 y.o. male Date: 03/22/2024 Primary Care Physican: Toma Deiters, MD Primary Cardiologist: Hochrein Electrophysiologist: Townsend Roger Pacing: 92%            05/14/2023 Weight: 155 lbs  07/22/2023 Weight: 154 lbs 09/30/2023 Weight:  155 lbs (baseline)           10/07/2023 Weight: 153 lbs      11/03/2023 Weight: 155 lbs      12/10/2023 Weight: 160 lbs       02/24/2024 Weight: 162 lbs  03/22/2024 Weight: 154-156 lbs                                Spoke with patient and heart failure questions reviewed.  Transmission results reviewed.  Pt asymptomatic for fluid accumulation.  Reports feeling well at this time and voices no complaints.     Diet:  He drinks a lot of ice water.   He does not eat restaurant foods.  No changes in diet.   Son does all the cooking.   CorVue thoracic impedance suggesting possible fluid accumulation starting 4/1.   Prescribed:  Furosemide 40 mg take 0.5 tablet(s) (20 mg total) by mouth daily (started 10/11). May take an additional 20 mg for weight gain over 2 lbs in 24 hours  Potassium 20 mEq take 1 tablet by mouth daily Spironolactone 25 mg take 0.5 tablet (12.5 mg total) by mouth daily.                                              Labs: 11/05/2023 Creatinine 1.28, BUN 36, Potassium 4.7, Sodium 141 09/21/2023 Creatinine 1.60, BUN 28, Potassium 4.6, Sodium 135, GFR 40  09/14/2023 Creatinine 1.24, BUN 17, Potassium 3.4, Sodium 134, GFR 55  09/13/2023 Creatinine 1.26, BUN 20, Potassium 3.0, Sodium 136, GFR 54  09/12/2023 Creatinine 1.39, BUN 21, Potassium 3.3, Sodium 137, GFR 48 09/11/2023 Creatinine 1.48, BUN 19, Potassium 3.4, Sodium 138, GFR 44  A complete set of results can be found in Results Review.   Recommendations:  Advised to limit salt and fluid intake.  Will recheck fluid levels 4/14.   Follow-up plan: ICM clinic phone appointment on 03/28/2024 to recheck fluid levels.   91 day device  clinic remote transmission 05/03/2024.     EP/Cardiology Office Visits:   Recall 10/08/2024 with Dr. Lalla Brothers or APP.   Recall 08/22/2024 with Dr Antoine Poche.     Copy of ICM check sent to Dr. Lalla Brothers.  3 month ICM trend: 03/21/2024.    12-14 Month ICM trend:     Karie Soda, RN 03/22/2024 7:40 AM

## 2024-03-28 ENCOUNTER — Ambulatory Visit: Attending: Cardiology

## 2024-03-28 DIAGNOSIS — Z95810 Presence of automatic (implantable) cardiac defibrillator: Secondary | ICD-10-CM

## 2024-03-28 DIAGNOSIS — I5042 Chronic combined systolic (congestive) and diastolic (congestive) heart failure: Secondary | ICD-10-CM

## 2024-03-30 NOTE — Progress Notes (Signed)
 EPIC Encounter for ICM Monitoring  Patient Name: Kevin Collins is a 89 y.o. male Date: 03/30/2024 Primary Care Physican: Veda Gerald, MD Primary Cardiologist: Hochrein Electrophysiologist: Kasandra Pain Pacing: 93%            05/14/2023 Weight: 155 lbs  07/22/2023 Weight: 154 lbs 09/30/2023 Weight:  155 lbs (baseline)           10/07/2023 Weight: 153 lbs      11/03/2023 Weight: 155 lbs      12/10/2023 Weight: 160 lbs       02/24/2024 Weight: 162 lbs  03/22/2024 Weight: 154-156 lbs                                Spoke with patient and heart failure questions reviewed.  Transmission results reviewed.  Pt asymptomatic for fluid accumulation.  Reports feeling well at this time and voices no complaints.     Diet:  He drinks a lot of ice water.   He does not eat restaurant foods.  No changes in diet.   Son does all the cooking.    CorVue thoracic impedance suggesting fluid levels returned to normal.   Prescribed:  Furosemide 40 mg take 0.5 tablet(s) (20 mg total) by mouth daily (started 10/11). May take an additional 20 mg for weight gain over 2 lbs in 24 hours  Potassium 20 mEq take 1 tablet by mouth daily Spironolactone 25 mg take 0.5 tablet (12.5 mg total) by mouth daily.                                              Labs: 11/05/2023 Creatinine 1.28, BUN 36, Potassium 4.7, Sodium 141 09/21/2023 Creatinine 1.60, BUN 28, Potassium 4.6, Sodium 135, GFR 40  09/14/2023 Creatinine 1.24, BUN 17, Potassium 3.4, Sodium 134, GFR 55  09/13/2023 Creatinine 1.26, BUN 20, Potassium 3.0, Sodium 136, GFR 54  09/12/2023 Creatinine 1.39, BUN 21, Potassium 3.3, Sodium 137, GFR 48 09/11/2023 Creatinine 1.48, BUN 19, Potassium 3.4, Sodium 138, GFR 44  A complete set of results can be found in Results Review.   Recommendations:  No changes and encouraged to call if experiencing any fluid symptoms.   Follow-up plan: ICM clinic phone appointment on 04/25/2024.   91 day device clinic remote transmission  05/03/2024.     EP/Cardiology Office Visits:   Recall 10/08/2024 with Dr. Marven Slimmer or APP.   08/24/2024 with Dr Lavonne Prairie.     Copy of ICM check sent to Dr. Marven Slimmer.  3 month ICM trend: 03/28/2024.    12-14 Month ICM trend:     Almyra Jain, RN 03/30/2024 7:28 AM

## 2024-04-14 DIAGNOSIS — I480 Paroxysmal atrial fibrillation: Secondary | ICD-10-CM | POA: Diagnosis not present

## 2024-04-14 DIAGNOSIS — N1831 Chronic kidney disease, stage 3a: Secondary | ICD-10-CM | POA: Diagnosis not present

## 2024-04-14 DIAGNOSIS — I5032 Chronic diastolic (congestive) heart failure: Secondary | ICD-10-CM | POA: Diagnosis not present

## 2024-04-14 DIAGNOSIS — N3942 Incontinence without sensory awareness: Secondary | ICD-10-CM | POA: Diagnosis not present

## 2024-04-14 DIAGNOSIS — I2510 Atherosclerotic heart disease of native coronary artery without angina pectoris: Secondary | ICD-10-CM | POA: Diagnosis not present

## 2024-04-14 DIAGNOSIS — I10 Essential (primary) hypertension: Secondary | ICD-10-CM | POA: Diagnosis not present

## 2024-04-14 DIAGNOSIS — Z6823 Body mass index (BMI) 23.0-23.9, adult: Secondary | ICD-10-CM | POA: Diagnosis not present

## 2024-04-14 DIAGNOSIS — L84 Corns and callosities: Secondary | ICD-10-CM | POA: Diagnosis not present

## 2024-04-14 DIAGNOSIS — Z0000 Encounter for general adult medical examination without abnormal findings: Secondary | ICD-10-CM | POA: Diagnosis not present

## 2024-04-19 ENCOUNTER — Other Ambulatory Visit: Payer: Self-pay

## 2024-04-19 MED ORDER — FUROSEMIDE 20 MG PO TABS: 20.0000 mg | ORAL_TABLET | Freq: Every day | ORAL | 3 refills | Status: AC

## 2024-04-25 ENCOUNTER — Ambulatory Visit: Attending: Cardiology

## 2024-04-25 DIAGNOSIS — I5042 Chronic combined systolic (congestive) and diastolic (congestive) heart failure: Secondary | ICD-10-CM | POA: Diagnosis not present

## 2024-04-25 DIAGNOSIS — H353133 Nonexudative age-related macular degeneration, bilateral, advanced atrophic without subfoveal involvement: Secondary | ICD-10-CM | POA: Diagnosis not present

## 2024-04-25 DIAGNOSIS — H401111 Primary open-angle glaucoma, right eye, mild stage: Secondary | ICD-10-CM | POA: Diagnosis not present

## 2024-04-25 DIAGNOSIS — H524 Presbyopia: Secondary | ICD-10-CM | POA: Diagnosis not present

## 2024-04-25 DIAGNOSIS — Z961 Presence of intraocular lens: Secondary | ICD-10-CM | POA: Diagnosis not present

## 2024-04-25 DIAGNOSIS — Z95810 Presence of automatic (implantable) cardiac defibrillator: Secondary | ICD-10-CM | POA: Diagnosis not present

## 2024-04-25 DIAGNOSIS — H40012 Open angle with borderline findings, low risk, left eye: Secondary | ICD-10-CM | POA: Diagnosis not present

## 2024-04-27 NOTE — Progress Notes (Signed)
 EPIC Encounter for ICM Monitoring  Patient Name: Kevin Collins is a 89 y.o. male Date: 04/27/2024 Primary Care Physican: Veda Gerald, MD Primary Cardiologist: Hochrein Electrophysiologist: Kasandra Pain Pacing: 93%            05/14/2023 Weight: 155 lbs  07/22/2023 Weight: 154 lbs 09/30/2023 Weight:  155 lbs (baseline)           10/07/2023 Weight: 153 lbs      11/03/2023 Weight: 155 lbs      12/10/2023 Weight: 160 lbs       02/24/2024 Weight: 162 lbs  03/22/2024 Weight: 154-156 lbs   04/27/2024 Weight: 156 lbs                              Spoke with patient and heart failure questions reviewed.  Transmission results reviewed.  Pt asymptomatic for fluid accumulation.  Reports feeling well at this time and voices no complaints.        Diet:  He drinks a lot of ice water.   He does not eat restaurant foods.  No changes in diet.   Son does all the cooking.    CorVue thoracic impedance suggesting normal fluid levels with the exception of possible fluid accumulation from 4/30-5/7.   Prescribed:  Furosemide  40 mg take 0.5 tablet(s) (20 mg total) by mouth daily (started 10/11). May take an additional 20 mg for weight gain over 2 lbs in 24 hours  Potassium 20 mEq take 1 tablet by mouth daily Spironolactone  25 mg take 0.5 tablet (12.5 mg total) by mouth daily.                                              Labs: 02/18/2024 Creatinine 1.46, BUN 30, Potassium 4.7, Sodium 142 11/05/2023 Creatinine 1.28, BUN 36, Potassium 4.7, Sodium 141 09/21/2023 Creatinine 1.60, BUN 28, Potassium 4.6, Sodium 135, GFR 40  A complete set of results can be found in Results Review.   Recommendations:  No changes and encouraged to call if experiencing any fluid symptoms.   Follow-up plan: ICM clinic phone appointment on 05/30/2024.   91 day device clinic remote transmission 05/03/2024.     EP/Cardiology Office Visits:   Recall 10/08/2024 with Dr. Marven Slimmer or APP.   08/24/2024 with Dr Lavonne Prairie.     Copy of ICM check sent  to Dr. Marven Slimmer.  3 month ICM trend: 04/25/2024.    12-14 Month ICM trend:     Almyra Jain, RN 04/27/2024 8:02 AM

## 2024-05-03 ENCOUNTER — Ambulatory Visit (INDEPENDENT_AMBULATORY_CARE_PROVIDER_SITE_OTHER): Payer: TRICARE For Life (TFL)

## 2024-05-03 DIAGNOSIS — L84 Corns and callosities: Secondary | ICD-10-CM | POA: Diagnosis not present

## 2024-05-03 DIAGNOSIS — Z6824 Body mass index (BMI) 24.0-24.9, adult: Secondary | ICD-10-CM | POA: Diagnosis not present

## 2024-05-03 DIAGNOSIS — I255 Ischemic cardiomyopathy: Secondary | ICD-10-CM

## 2024-05-03 LAB — CUP PACEART REMOTE DEVICE CHECK
Battery Remaining Longevity: 73 mo
Battery Remaining Percentage: 76 %
Battery Voltage: 3.01 V
Date Time Interrogation Session: 20250519020009
Implantable Lead Connection Status: 753985
Implantable Lead Connection Status: 753985
Implantable Lead Implant Date: 20230210
Implantable Lead Implant Date: 20230210
Implantable Lead Location: 753858
Implantable Lead Location: 753860
Implantable Pulse Generator Implant Date: 20230210
Lead Channel Impedance Value: 440 Ohm
Lead Channel Impedance Value: 510 Ohm
Lead Channel Pacing Threshold Amplitude: 0.5 V
Lead Channel Pacing Threshold Amplitude: 1 V
Lead Channel Pacing Threshold Pulse Width: 0.5 ms
Lead Channel Pacing Threshold Pulse Width: 0.5 ms
Lead Channel Sensing Intrinsic Amplitude: 12 mV
Lead Channel Setting Pacing Amplitude: 2.25 V
Lead Channel Setting Pacing Amplitude: 2.5 V
Lead Channel Setting Pacing Pulse Width: 0.5 ms
Lead Channel Setting Pacing Pulse Width: 0.5 ms
Lead Channel Setting Sensing Sensitivity: 4 mV
Pulse Gen Model: 3562
Pulse Gen Serial Number: 3992207

## 2024-05-05 ENCOUNTER — Ambulatory Visit: Payer: Self-pay | Admitting: Cardiology

## 2024-05-30 ENCOUNTER — Ambulatory Visit: Attending: Cardiology

## 2024-05-30 DIAGNOSIS — Z95810 Presence of automatic (implantable) cardiac defibrillator: Secondary | ICD-10-CM | POA: Diagnosis not present

## 2024-05-30 DIAGNOSIS — I5042 Chronic combined systolic (congestive) and diastolic (congestive) heart failure: Secondary | ICD-10-CM

## 2024-05-31 NOTE — Progress Notes (Signed)
 EPIC Encounter for ICM Monitoring  Patient Name: Kevin Collins is a 89 y.o. male Date: 05/31/2024 Primary Care Physican: Veda Gerald, MD Primary Cardiologist: Hochrein Electrophysiologist: Kasandra Pain Pacing: 93%            05/14/2023 Weight: 155 lbs  07/22/2023 Weight: 154 lbs 09/30/2023 Weight:  155 lbs (baseline)           10/07/2023 Weight: 153 lbs      11/03/2023 Weight: 155 lbs      12/10/2023 Weight: 160 lbs       02/24/2024 Weight: 162 lbs  03/22/2024 Weight: 154-156 lbs   04/27/2024 Weight: 156 lbs   05/31/2024 Weight: 155.6 lbs                           Spoke with patient and heart failure questions reviewed.  Transmission results reviewed.  Pt asymptomatic for fluid accumulation.  Weight fluctuates 2-3 lbs depending on what he eats.     Diet:  He drinks a lot of ice water.   He does not eat restaurant foods.  No changes in diet.   Son does all the cooking.    CorVue thoracic impedance suggesting normal fluid levels within the last month.   Prescribed:  Furosemide  40 mg take 0.5 tablet(s) (20 mg total) by mouth daily (started 10/11). May take an additional 20 mg for weight gain over 2 lbs in 24 hours  Potassium 20 mEq take 1 tablet by mouth daily Spironolactone  25 mg take 0.5 tablet (12.5 mg total) by mouth daily.                                              Labs: 02/18/2024 Creatinine 1.46, BUN 30, Potassium 4.7, Sodium 142 11/05/2023 Creatinine 1.28, BUN 36, Potassium 4.7, Sodium 141 09/21/2023 Creatinine 1.60, BUN 28, Potassium 4.6, Sodium 135, GFR 40  A complete set of results can be found in Results Review.   Recommendations:  No changes and encouraged to call if experiencing any fluid symptoms.   Follow-up plan: ICM clinic phone appointment on 07/18/2024.   91 day device clinic remote transmission 08/02/2024.     EP/Cardiology Office Visits:   Recall 10/08/2024 with Dr. Marven Slimmer or APP.   08/24/2024 with Dr Lavonne Prairie.     Copy of ICM check sent to Dr. Marven Slimmer.  3 month  ICM trend: 05/30/2024.    12-14 Month ICM trend:     Almyra Jain, RN 05/31/2024 9:01 AM

## 2024-06-06 DIAGNOSIS — H401132 Primary open-angle glaucoma, bilateral, moderate stage: Secondary | ICD-10-CM | POA: Diagnosis not present

## 2024-06-06 DIAGNOSIS — H43813 Vitreous degeneration, bilateral: Secondary | ICD-10-CM | POA: Diagnosis not present

## 2024-06-06 DIAGNOSIS — H353133 Nonexudative age-related macular degeneration, bilateral, advanced atrophic without subfoveal involvement: Secondary | ICD-10-CM | POA: Diagnosis not present

## 2024-06-06 DIAGNOSIS — Z961 Presence of intraocular lens: Secondary | ICD-10-CM | POA: Diagnosis not present

## 2024-06-06 DIAGNOSIS — H35033 Hypertensive retinopathy, bilateral: Secondary | ICD-10-CM | POA: Diagnosis not present

## 2024-06-20 DIAGNOSIS — H353123 Nonexudative age-related macular degeneration, left eye, advanced atrophic without subfoveal involvement: Secondary | ICD-10-CM | POA: Diagnosis not present

## 2024-06-24 NOTE — Progress Notes (Signed)
 Remote pacemaker transmission.

## 2024-06-28 ENCOUNTER — Other Ambulatory Visit: Payer: Self-pay

## 2024-06-28 ENCOUNTER — Ambulatory Visit: Admitting: Infectious Diseases

## 2024-06-28 ENCOUNTER — Encounter: Payer: Self-pay | Admitting: Infectious Diseases

## 2024-06-28 VITALS — BP 113/59 | HR 61 | Temp 97.7°F | Ht 69.0 in | Wt 160.0 lb

## 2024-06-28 DIAGNOSIS — A491 Streptococcal infection, unspecified site: Secondary | ICD-10-CM

## 2024-06-28 DIAGNOSIS — R7881 Bacteremia: Secondary | ICD-10-CM

## 2024-06-28 DIAGNOSIS — Z79899 Other long term (current) drug therapy: Secondary | ICD-10-CM | POA: Diagnosis not present

## 2024-06-28 DIAGNOSIS — T827XXD Infection and inflammatory reaction due to other cardiac and vascular devices, implants and grafts, subsequent encounter: Secondary | ICD-10-CM

## 2024-06-28 LAB — BASIC METABOLIC PANEL WITH GFR
BUN/Creatinine Ratio: 16 (calc) (ref 6–22)
BUN: 21 mg/dL (ref 7–25)
CO2: 32 mmol/L (ref 20–32)
Calcium: 10.8 mg/dL — ABNORMAL HIGH (ref 8.6–10.3)
Chloride: 104 mmol/L (ref 98–110)
Creat: 1.32 mg/dL — ABNORMAL HIGH (ref 0.70–1.22)
Glucose, Bld: 100 mg/dL — ABNORMAL HIGH (ref 65–99)
Potassium: 4.2 mmol/L (ref 3.5–5.3)
Sodium: 141 mmol/L (ref 135–146)
eGFR: 51 mL/min/1.73m2 — ABNORMAL LOW (ref >=60)

## 2024-06-28 LAB — CBC
HCT: 36 % — ABNORMAL LOW (ref 38.5–50.0)
Hemoglobin: 11.5 g/dL — ABNORMAL LOW (ref 13.2–17.1)
MCH: 30.2 pg (ref 27.0–33.0)
MCHC: 31.9 g/dL — ABNORMAL LOW (ref 32.0–36.0)
MCV: 94.5 fL (ref 80.0–100.0)
MPV: 10.6 fL (ref 7.5–12.5)
Platelets: 144 Thousand/uL (ref 140–400)
RBC: 3.81 Million/uL — ABNORMAL LOW (ref 4.20–5.80)
RDW: 11.5 % (ref 11.0–15.0)
WBC: 6.2 Thousand/uL (ref 3.8–10.8)

## 2024-06-28 NOTE — Progress Notes (Addendum)
 Patient Active Problem List   Diagnosis Date Noted   Enterococcal infection 02/20/2024   Callus of foot 02/20/2024   Pacemaker infection (HCC) 09/25/2023   Fall 09/25/2023   Bacteremia due to Enterococcus 09/25/2023   Transaminitis 09/10/2023   AKI (acute kidney injury) (HCC) 09/10/2023   CHF (congestive heart failure) (HCC) 09/09/2023   Pain in left foot 08/21/2023   Peripheral neuropathy 08/19/2023   Paroxysmal atrial fibrillation (HCC) 08/19/2023   Sepsis due to undetermined organism (HCC) 08/18/2023   Symptomatic bradycardia 01/24/2022   Bradycardia 05/07/2020   Nonrheumatic aortic valve insufficiency 05/07/2020   Acute on chronic combined systolic and diastolic HF (heart failure) (HCC) 05/07/2020   Alcoholic cardiomyopathy (HCC) 95/84/7978   Acute heart failure (HCC) 08/31/2019   Medication management 08/31/2019   Atrial fibrillation (HCC) 08/31/2019   Coronary artery disease involving native coronary artery of native heart without angina pectoris 05/25/2019   Essential hypertension 05/25/2019   Dyslipidemia 05/25/2019   Educated about COVID-19 virus infection 05/25/2019    Patient's Medications  New Prescriptions   No medications on file  Previous Medications   AMOXICILLIN  (AMOXIL ) 500 MG CAPSULE    Take 1 capsule (500 mg total) by mouth 3 (three) times daily.   APIXABAN  (ELIQUIS ) 2.5 MG TABS TABLET    Take 1 tablet (2.5 mg total) by mouth 2 (two) times daily.   BIMATOPROST (LUMIGAN) 0.01 % SOLN    Place 1 drop into both eyes at bedtime.   EZETIMIBE  (ZETIA ) 10 MG TABLET    Take 1 tablet (10 mg total) by mouth daily.   FUROSEMIDE  (LASIX ) 20 MG TABLET    Take 1 tablet (20 mg total) by mouth daily. May take an additional 20 mg for weigh gain over 2 lbs in 24 hours   GABAPENTIN  (NEURONTIN ) 800 MG TABLET    Take 800 mg by mouth 3 (three) times daily.   LISINOPRIL  (ZESTRIL ) 10 MG TABLET    Take 10 mg by mouth daily.   MULTIPLE VITAMINS-MINERALS (PRESERVISION AREDS) CAPS     Take 1 capsule by mouth 2 (two) times daily.   OXYCODONE -ACETAMINOPHEN  (PERCOCET) 7.5-325 MG TABLET    Take 1 tablet by mouth every 8 (eight) hours as needed for severe pain.   SIMVASTATIN  (ZOCOR ) 40 MG TABLET    Take 1 tablet (40 mg total) by mouth daily.  Modified Medications   No medications on file  Discontinued Medications   No medications on file    Subjective: 89 year old male w PMH of CAD s/p CABG, HTN, dyslipidemia, CKD, peripheral neuropathy, s/p biventricular pacemaker 01/24/2022 who is here for HFU for E faecalis bacteremia, presumptive endocarditis  and CIED infection. Patient was discharged 9/7 with plan to complete 6 weeks of IV ampicillin  and ceftriaxone , EOT 10/16 to be followed by PO amoxicillin  suppression indefinitely.   Patient was seen in the ED 9/18 for fluid overload and left AMA, then admitted 9/25-9/30 for volume overload/acute CHFrEF. He was managed with diuretics, rt sided thoracentesis. TTE with EF 35-40% and discharged after improvement. He was seen by Cardiology for fu on 10/7  IV abtx completed 10/16 after which started on po amoxicillin  10/17.  7/15  Reports taking PO amoxicillin  three times a day. Follows PCP for calluses of his feet on and off but denies any wounds currently. PCP trims foot calluses as needed. He is also undergoing injections for macular degeneration in his eyes. Last seen by Cardiology on 3/12. Has an upcoming appt with PCP,  Opthalmologist and then Cardiology. No other complaints   Review of Systems: All systems reviewed with pertinent positives and negatives as listed above  Past Medical History:  Diagnosis Date   Bacteremia 08/2023   Bradycardia, unspecified    CAD (coronary artery disease)    a. 2000 s/p CABG x 4 (LIMA->LAD, VG->OM, VG->Diag, VG->RPDA/RPL.   Carotid arterial disease (HCC)    a. 12/2019 U/S: RICA 1-39% bilat.   Chronic HFrEF (heart failure with reduced ejection fraction) (HCC)    a. 08/2023 Echo: EF 35-40%, glob HK,  GrIII DD, mildly reduced RV fxn, sev BAE, mod MR, mild-mod TR.   CKD (chronic kidney disease), stage III (HCC)    Hereditary and idiopathic neuropathy, unspecified    History of shingles    Hyperlipidemia    Hypertension    ESSENTIAL PRIMARY   NICM (nonischemic cardiomyopathy) (HCC)    a. 01/2022 s/p SJM 3562, quadra Allure MP CRT-P (LV/RV leads, no RA); b. 08/2023 Echo: EF 35-40%.   Peripheral neuropathy    Persistent atrial fibrillation (HCC)    a. CHA2DS2VASc = 5-->eliquis .   Past Surgical History:  Procedure Laterality Date   BIV PACEMAKER INSERTION CRT-P N/A 01/24/2022   Procedure: BIV PACEMAKER INSERTION CRT-P;  Surgeon: Cindie Ole DASEN, MD;  Location: [facility] INVASIVE CV LAB;  Service: Cardiovascular;  Laterality: N/A;   CARDIAC CATHETERIZATION Left 09/1999   NORMAL LEFT MAIN, OCCLUDED LAD, OCCLUDED MID CFX, OCCLUDED PROXIMAL RCA, 60% STENOSIS PROXIMAL DIAG 1, RIGHT TO LEFT COLLATERAL, LEFT TO RIGHT COLLATERAL; LVEF OF 48% DOCUMENTED VIA NUCLEAR STUDY ON 07/12/2009.   CARDIOVERSION N/A 10/05/2019   Procedure: CARDIOVERSION;  Surgeon: Francyne Headland, MD;  Location: [facility] ENDOSCOPY;  Service: Cardiovascular;  Laterality: N/A;   CATARACT EXTRACTION     CORONARY ARTERY BYPASS GRAFT     w/ LIMA TO LAD, SVG TO dx, SVG TO OM, SVG TO AM-PD-PL 10/12/99 [facility]   REMOVAL OF ANKLE PLATE      Social History   Tobacco Use   Smoking status: Former   Smokeless tobacco: Never  Vaping Use   Vaping status: Never Used  Substance Use Topics   Alcohol use: Never   Drug use: Never    Family History  Problem Relation Age of Onset   Other Mother        MALIGNANT TUMOR OF OVARY   CVA Father     No Known Allergies  Health Maintenance  Topic Date Due   COVID-19 Vaccine (1) Never done   DTaP/Tdap/Td (1 - Tdap) Never done   Pneumococcal Vaccine: 50+ Years (1 of 2 - PCV) Never done   Zoster Vaccines- Shingrix (1 of 2) Never done   INFLUENZA VACCINE  07/15/2024   Medicare Annual Wellness  (AWV)  01/11/2025   Hepatitis B Vaccines  Aged Out   HPV VACCINES  Aged Out   Meningococcal B Vaccine  Aged Out    Objective: BP (!) 113/59   Pulse 61   Temp 97.7 F (36.5 C) (Temporal)   Ht 5' 9 (1.753 m)   Wt 160 lb (72.6 kg)   SpO2 98%   BMI 23.63 kg/m  Physical Exam Constitutional:      Appearance: Normal appearance.  HENT:     Head: Normocephalic and atraumatic.      Mouth: Mucous membranes are moist.  Eyes:    Conjunctiva/sclera: Conjunctivae normal.     Pupils: Pupils are equal, round, and bilaterally symmetrical   Cardiovascular:     Rate and Rhythm:  Normal rate and Irregular rhythm.     Heart sounds:    Pulmonary:     Effort: Pulmonary effort is normal.     Breath sounds:   Abdominal:     General: Non distended     Palpations:   Musculoskeletal:        General: Normal range of motion. Ambulatory  Skin:    General: Skin is warm and dry.     Comments: left PPM pocket OK  Neurological:     General: grossly non focal     Mental Status: awake, alert and oriented to person, place, and time. Ambulatory   Psychiatric:        Mood and Affect: Mood normal.   Lab Results Lab Results  Component Value Date   WBC 7.3 02/18/2024   HGB 12.5 (L) 02/18/2024   HCT 40.3 02/18/2024   MCV 93.1 02/18/2024   PLT 174 02/18/2024    Lab Results  Component Value Date   CREATININE 1.46 (H) 02/18/2024   BUN 30 (H) 02/18/2024   NA 142 02/18/2024   K 4.7 02/18/2024   CL 103 02/18/2024   CO2 31 02/18/2024    Lab Results  Component Value Date   ALT 189 (H) 09/13/2023   AST 114 (H) 09/13/2023   ALKPHOS 43 09/13/2023   BILITOT 0.5 09/13/2023    No results found for: CHOL, HDL, LDLCALC, LDLDIRECT, TRIG, CHOLHDL No results found for: LABRPR, RPRTITER No results found for: HIV1RNAQUANT, HIV1RNAVL, CD4TABS   Assessment/Plan 89 year old male w PMH of CAD s/p CABG, HTN, dyslipidemia, peripheral neuropathy, s/p biventricular pacemaker 01/24/2022  who presented to [facility] ED 9/3 with acute onset  fevers chills and generalized weakness for 1 day. Admitted with    # E faecalis bacteremia, presumptive endocarditis as well as CIED infection - s/p 6 weeks of IV ceftriaxone  + IV ampicillin . EOT 09/30/23. Started PO amoxicillin  10/17 till date  Plan - continue PO Amoxicillin  500 mg po tid, to be continued indefinitely. No need for refills today - Labs today  - Fu in 4 months   - Fu with EP as instructed   # Elevated Cr/Possible CKD - fu with PCP, last seen on 5/20.  # CHFrEF/CAD/Persistent A fib/HTN/HLD - on medications - fu with Cardiology as instructed, last seen on 02/24/24  I spent 20 minutes involved in face-to-face and non-face-to-face activities for this patient on the day of the visit. Professional time spent includes the following activities: Preparing to see the patient (review of tests), Obtaining and  reviewing separately obtained history ( Cardiology notes on 3/12), Performing a medically appropriate examination and evaluation , Ordering lab work, Documenting clinical information in the EMR, Independently interpreting results (not separately reported), Communicating results to the patient, Counseling and educating the patient.    Annalee Joseph, MD [facility] 06/28/2024, 8:32 AM

## 2024-06-28 NOTE — Addendum Note (Signed)
 Addended by: DEA SHINER on: 06/28/2024 06:38 PM   Modules accepted: Level of Service

## 2024-06-29 ENCOUNTER — Ambulatory Visit: Payer: Self-pay | Admitting: Infectious Diseases

## 2024-07-12 DIAGNOSIS — I10 Essential (primary) hypertension: Secondary | ICD-10-CM | POA: Diagnosis not present

## 2024-07-12 DIAGNOSIS — I480 Paroxysmal atrial fibrillation: Secondary | ICD-10-CM | POA: Diagnosis not present

## 2024-07-12 DIAGNOSIS — L84 Corns and callosities: Secondary | ICD-10-CM | POA: Diagnosis not present

## 2024-07-12 DIAGNOSIS — Z6823 Body mass index (BMI) 23.0-23.9, adult: Secondary | ICD-10-CM | POA: Diagnosis not present

## 2024-07-18 ENCOUNTER — Ambulatory Visit: Attending: Cardiology

## 2024-07-18 DIAGNOSIS — H35033 Hypertensive retinopathy, bilateral: Secondary | ICD-10-CM | POA: Diagnosis not present

## 2024-07-18 DIAGNOSIS — Z961 Presence of intraocular lens: Secondary | ICD-10-CM | POA: Diagnosis not present

## 2024-07-18 DIAGNOSIS — H353113 Nonexudative age-related macular degeneration, right eye, advanced atrophic without subfoveal involvement: Secondary | ICD-10-CM | POA: Diagnosis not present

## 2024-07-18 DIAGNOSIS — Z95810 Presence of automatic (implantable) cardiac defibrillator: Secondary | ICD-10-CM | POA: Diagnosis not present

## 2024-07-18 DIAGNOSIS — H43813 Vitreous degeneration, bilateral: Secondary | ICD-10-CM | POA: Diagnosis not present

## 2024-07-18 DIAGNOSIS — H401132 Primary open-angle glaucoma, bilateral, moderate stage: Secondary | ICD-10-CM | POA: Diagnosis not present

## 2024-07-18 DIAGNOSIS — I5042 Chronic combined systolic (congestive) and diastolic (congestive) heart failure: Secondary | ICD-10-CM | POA: Diagnosis not present

## 2024-07-20 ENCOUNTER — Telehealth: Payer: Self-pay

## 2024-07-20 NOTE — Progress Notes (Signed)
 EPIC Encounter for ICM Monitoring  Patient Name: Kevin Collins is a 89 y.o. male Date: 07/20/2024 Primary Care Physican: Orpha Yancey LABOR, MD Primary Cardiologist: Hochrein Electrophysiologist: Cindie Pore Pacing: 93%            05/14/2023 Weight: 155 lbs  12/10/2023 Weight: 160 lbs       05/31/2024 Weight: 155.6 lbs                           Attempted call to patient and unable to reach.  Left detailed message per DPR regarding transmission.  Transmission results reviewed.      Diet:  He drinks a lot of ice water.   He does not eat restaurant foods.  No changes in diet.   Son does all the cooking.    CorVue thoracic impedance suggesting normal fluid levels with the exception of possible fluid accumulation from 7/11-7/24.   Prescribed:  Furosemide  40 mg take 0.5 tablet(s) (20 mg total) by mouth daily (started 10/11). May take an additional 20 mg for weight gain over 2 lbs in 24 hours  Potassium 20 mEq take 1 tablet by mouth daily Spironolactone  25 mg take 0.5 tablet (12.5 mg total) by mouth daily.                                              Labs: 06/28/2024 Creatinine 1.32, BUN 21, Potassium 4.2, Sodium 141, GFR 51 02/18/2024 Creatinine 1.46, BUN 30, Potassium 4.7, Sodium 142 11/05/2023 Creatinine 1.28, BUN 36, Potassium 4.7, Sodium 141 09/21/2023 Creatinine 1.60, BUN 28, Potassium 4.6, Sodium 135, GFR 40  A complete set of results can be found in Results Review.   Recommendations:  Left voice mail with ICM number and encouraged to call if experiencing any fluid symptoms.   Follow-up plan: ICM clinic phone appointment on 08/22/2024.   91 day device clinic remote transmission 08/02/2024.     EP/Cardiology Office Visits:   Recall 10/08/2024 with Dr. Cindie or APP.   08/24/2024 with Dr Lavona.     Copy of ICM check sent to Dr. Cindie.  3 month ICM trend: 07/18/2024.    12-14 Month ICM trend:     [name], RN 07/20/2024 8:05 AM

## 2024-07-20 NOTE — Telephone Encounter (Signed)
 Remote ICM transmission received.  Attempted call to patient regarding ICM remote transmission and left detailed message per DPR.  Left ICM phone number and advised to return call for any fluid symptoms or questions. Next ICM remote transmission scheduled 08/22/2024.

## 2024-08-02 ENCOUNTER — Ambulatory Visit (INDEPENDENT_AMBULATORY_CARE_PROVIDER_SITE_OTHER): Payer: TRICARE For Life (TFL)

## 2024-08-02 DIAGNOSIS — I255 Ischemic cardiomyopathy: Secondary | ICD-10-CM | POA: Diagnosis not present

## 2024-08-02 LAB — CUP PACEART REMOTE DEVICE CHECK
Battery Remaining Longevity: 70 mo
Battery Remaining Percentage: 74 %
Battery Voltage: 2.99 V
Date Time Interrogation Session: 20250819040009
Implantable Lead Connection Status: 753985
Implantable Lead Connection Status: 753985
Implantable Lead Implant Date: 20230210
Implantable Lead Implant Date: 20230210
Implantable Lead Location: 753858
Implantable Lead Location: 753860
Implantable Pulse Generator Implant Date: 20230210
Lead Channel Impedance Value: 440 Ohm
Lead Channel Impedance Value: 490 Ohm
Lead Channel Pacing Threshold Amplitude: 0.5 V
Lead Channel Pacing Threshold Amplitude: 1 V
Lead Channel Pacing Threshold Pulse Width: 0.5 ms
Lead Channel Pacing Threshold Pulse Width: 0.5 ms
Lead Channel Sensing Intrinsic Amplitude: 12 mV
Lead Channel Setting Pacing Amplitude: 2.25 V
Lead Channel Setting Pacing Amplitude: 2.5 V
Lead Channel Setting Pacing Pulse Width: 0.5 ms
Lead Channel Setting Pacing Pulse Width: 0.5 ms
Lead Channel Setting Sensing Sensitivity: 4 mV
Pulse Gen Model: 3562
Pulse Gen Serial Number: 3992207

## 2024-08-04 ENCOUNTER — Ambulatory Visit: Payer: Self-pay | Admitting: Cardiology

## 2024-08-21 NOTE — Progress Notes (Unsigned)
  Cardiology Office Note:   Date:  08/21/2024  ID:  Kevin Collins, DOB 1930/04/18, MRN [id number] PCP: Orpha Yancey LABOR, MD  [facility] Providers Cardiologist:  Lynwood Schilling, MD Electrophysiologist:  OLE ONEIDA HOLTS, MD {  History of Present Illness:   Kevin Collins is a 89 y.o. male with a history of CAD s/p CABG in 10/1999 (LIMA-LAD, SVG-OM, SVG-diagonal, SVG-PDA/PL), history of bradycardia, atrial fib, HTN, HLD and peripheral neuropathy and EF 40 to 45%.  He has had cardioversion in 09/2019.  However, he went back into atrial fib.  he underwent successful implantation of a CRT-P BiV on 01/24/2022.   This was for symptomatic bradycardia.     Since I last saw him ***   *** He was in: Hospital couple times last year.  He had infection with cellulitis and osteomyelitis.  He had to be readmitted soon after that admission with volume overload.  We have seen him a couple times in the office since then.  His meds are as listed below.  He denies chest pain or SOB currently.  He does take his Lasix  20 mg every day now and takes an extra 20 if he needs it.  He thinks he has done well from a cardiovascular standpoint.  He is not having any new chest pressure, neck or arm discomfort.  He has not had any new shortness of breath, PND or orthopnea.  ROS: ***  Studies Reviewed:    EKG:       ***  Risk Assessment/Calculations:   {Does this patient have ATRIAL FIBRILLATION?:[id number]} No BP recorded.  {Refresh Note OR Click here to enter BP  :1}***        Physical Exam:   VS:  There were no vitals taken for this visit.   Wt Readings from Last 3 Encounters:  06/28/24 160 lb (72.6 kg)  02/24/24 162 lb (73.5 kg)  02/18/24 159 lb (72.1 kg)     GEN: Well nourished, well developed in no acute distress NECK: No JVD; No carotid bruits CARDIAC: ***RR, *** murmurs, rubs, gallops RESPIRATORY:  Clear to auscultation without rales, wheezing or rhonchi  ABDOMEN: Soft, non-tender,  non-distended EXTREMITIES:  No edema; No deformity   ASSESSMENT AND PLAN:   Persistent atrial fibrillation:    ***  Kevin Collins has a CHA2DS2 - VASc score of 4 he tolerates anticoagulation.     Bradycardia/PPM:  ***  He has a history of BiV PPM and is up to date with follow up in Feb 2025.  I reviewed this report for this visit.    CAD s/p CABG:   ***    He has no new anginal symptoms.  No change in therapy.    Hypertension:   The blood pressure is ***  running low.  He would not tolerate med titration.  He tolerates the low-dose of medicines that he is on well.   *** He had normal impedence on device interrogation March 2025.  I reviewed this reading for this visit.     AI:  This was mild along with some mitral regurgitation in September 2024.  ***  in the hospital.  No change in therapy.   Hyperlipidemia:   LDL was ***  41.       Follow up ***  Signed, Lynwood Schilling, MD

## 2024-08-22 ENCOUNTER — Ambulatory Visit: Attending: Cardiology

## 2024-08-22 DIAGNOSIS — I5042 Chronic combined systolic (congestive) and diastolic (congestive) heart failure: Secondary | ICD-10-CM

## 2024-08-22 DIAGNOSIS — Z95810 Presence of automatic (implantable) cardiac defibrillator: Secondary | ICD-10-CM

## 2024-08-23 DIAGNOSIS — H401132 Primary open-angle glaucoma, bilateral, moderate stage: Secondary | ICD-10-CM | POA: Diagnosis not present

## 2024-08-23 DIAGNOSIS — Z961 Presence of intraocular lens: Secondary | ICD-10-CM | POA: Diagnosis not present

## 2024-08-23 DIAGNOSIS — H35033 Hypertensive retinopathy, bilateral: Secondary | ICD-10-CM | POA: Diagnosis not present

## 2024-08-23 DIAGNOSIS — H43813 Vitreous degeneration, bilateral: Secondary | ICD-10-CM | POA: Diagnosis not present

## 2024-08-23 DIAGNOSIS — H353133 Nonexudative age-related macular degeneration, bilateral, advanced atrophic without subfoveal involvement: Secondary | ICD-10-CM | POA: Diagnosis not present

## 2024-08-23 NOTE — Progress Notes (Signed)
 EPIC Encounter for ICM Monitoring  Patient Name: Kevin Collins is a 89 y.o. male Date: 08/23/2024 Primary Care Physican: Orpha Yancey LABOR, MD Primary Cardiologist: Hochrein Electrophysiologist: Cindie Pore Pacing: 94%            05/14/2023 Weight: 155 lbs  12/10/2023 Weight: 160 lbs       05/31/2024 Weight: 155.6 lbs        08/23/2024 Weight: 154.6 lbs                    Spoke with patient and heart failure questions reviewed.  Transmission results reviewed.  Pt asymptomatic for fluid accumulation.  Reports feeling well at this time and voices no complaints.     Diet:  He drinks a lot of ice water.   He does not eat restaurant foods.  No changes in diet.   Son does all the cooking.    CorVue thoracic impedance suggesting normal fluid levels within the last month.   Prescribed:  Furosemide  40 mg take 0.5 tablet(s) (20 mg total) by mouth daily (started 10/11). May take an additional 20 mg for weight gain over 2 lbs in 24 hours  Potassium 20 mEq take 1 tablet by mouth daily Spironolactone  25 mg take 0.5 tablet (12.5 mg total) by mouth daily.                                              Labs: 06/28/2024 Creatinine 1.32, BUN 21, Potassium 4.2, Sodium 141, GFR 51 02/18/2024 Creatinine 1.46, BUN 30, Potassium 4.7, Sodium 142 11/05/2023 Creatinine 1.28, BUN 36, Potassium 4.7, Sodium 141 09/21/2023 Creatinine 1.60, BUN 28, Potassium 4.6, Sodium 135, GFR 40  A complete set of results can be found in Results Review.   Recommendations:   No changes and encouraged to call if experiencing any fluid symptoms.   Follow-up plan: ICM clinic phone appointment on 10/03/2024.   91 day device clinic remote transmission 11/01/2024.     EP/Cardiology Office Visits:   Recall 10/08/2024 with Dr. Cindie or APP.   08/24/2024 with Dr Lavona.     Copy of ICM check sent to Dr. Cindie.  3 month ICM trend: 08/22/2024.    12-14 Month ICM trend:     [name], RN 08/23/2024 2:17 PM

## 2024-08-24 ENCOUNTER — Encounter: Payer: Self-pay | Admitting: Cardiology

## 2024-08-24 ENCOUNTER — Ambulatory Visit (INDEPENDENT_AMBULATORY_CARE_PROVIDER_SITE_OTHER): Admitting: Cardiology

## 2024-08-24 VITALS — BP 122/68 | HR 63 | Ht 70.0 in | Wt 161.0 lb

## 2024-08-24 DIAGNOSIS — I4819 Other persistent atrial fibrillation: Secondary | ICD-10-CM

## 2024-08-24 DIAGNOSIS — E785 Hyperlipidemia, unspecified: Secondary | ICD-10-CM | POA: Diagnosis not present

## 2024-08-24 DIAGNOSIS — I10 Essential (primary) hypertension: Secondary | ICD-10-CM

## 2024-08-24 DIAGNOSIS — I351 Nonrheumatic aortic (valve) insufficiency: Secondary | ICD-10-CM | POA: Diagnosis not present

## 2024-08-24 NOTE — Patient Instructions (Signed)

## 2024-09-07 NOTE — Progress Notes (Signed)
 Remote PPM Transmission

## 2024-09-19 DIAGNOSIS — I10 Essential (primary) hypertension: Secondary | ICD-10-CM | POA: Diagnosis not present

## 2024-09-19 DIAGNOSIS — L570 Actinic keratosis: Secondary | ICD-10-CM | POA: Diagnosis not present

## 2024-09-19 DIAGNOSIS — L84 Corns and callosities: Secondary | ICD-10-CM | POA: Diagnosis not present

## 2024-09-19 DIAGNOSIS — I5032 Chronic diastolic (congestive) heart failure: Secondary | ICD-10-CM | POA: Diagnosis not present

## 2024-09-19 DIAGNOSIS — I480 Paroxysmal atrial fibrillation: Secondary | ICD-10-CM | POA: Diagnosis not present

## 2024-09-19 DIAGNOSIS — N1831 Chronic kidney disease, stage 3a: Secondary | ICD-10-CM | POA: Diagnosis not present

## 2024-09-19 DIAGNOSIS — Z6824 Body mass index (BMI) 24.0-24.9, adult: Secondary | ICD-10-CM | POA: Diagnosis not present

## 2024-09-19 DIAGNOSIS — I2510 Atherosclerotic heart disease of native coronary artery without angina pectoris: Secondary | ICD-10-CM | POA: Diagnosis not present

## 2024-09-26 ENCOUNTER — Other Ambulatory Visit: Payer: Self-pay | Admitting: Student

## 2024-09-26 DIAGNOSIS — I4811 Longstanding persistent atrial fibrillation: Secondary | ICD-10-CM

## 2024-09-26 NOTE — Telephone Encounter (Signed)
 Eliquis  2.5mg  refill request received. Patient is 89 years old, weight-73kg, Crea-1.32 on 06/28/24, Diagnosis-Afib, and last seen by Dr. Lavona on 08/24/24.  Per last OV on 08/24/24 it states: Persistent atrial fibrillation: He tolerates anticoagulation.  We keep him on the lower dose because he has had creatinines that have been elevated previously and because of his age.  No change in therapy.   Will send in refill to requested pharmacy.

## 2024-10-03 ENCOUNTER — Ambulatory Visit: Attending: Cardiology

## 2024-10-03 DIAGNOSIS — Z95810 Presence of automatic (implantable) cardiac defibrillator: Secondary | ICD-10-CM | POA: Diagnosis not present

## 2024-10-03 DIAGNOSIS — I5042 Chronic combined systolic (congestive) and diastolic (congestive) heart failure: Secondary | ICD-10-CM

## 2024-10-04 ENCOUNTER — Telehealth: Payer: Self-pay

## 2024-10-04 DIAGNOSIS — H353133 Nonexudative age-related macular degeneration, bilateral, advanced atrophic without subfoveal involvement: Secondary | ICD-10-CM | POA: Diagnosis not present

## 2024-10-04 NOTE — Progress Notes (Signed)
 EPIC Encounter for ICM Monitoring  Patient Name: Kevin Collins is a 89 y.o. male Date: 10/04/2024 Primary Care Physican: Orpha Yancey LABOR, MD Primary Cardiologist: Hochrein Electrophysiologist: Cindie Pore Pacing: 94%            05/14/2023 Weight: 155 lbs  12/10/2023 Weight: 160 lbs       05/31/2024 Weight: 155.6 lbs        08/23/2024 Weight: 154.6 lbs                    Attempted call to patient and unable to reach.  Left detailed message per DPR regarding transmission.  Transmission results reviewed.    Diet:  He drinks a lot of ice water.   He does not eat restaurant foods.  No changes in diet.   Son does all the cooking.    Since 08/22/2024 ICM Remote Transmission: CorVue thoracic impedance suggesting normal fluid levels within the last month.   Prescribed:  Furosemide  40 mg take 0.5 tablet(s) (20 mg total) by mouth daily (started 10/11). May take an additional 20 mg for weight gain over 2 lbs in 24 hours  Potassium 20 mEq take 1 tablet by mouth daily Spironolactone  25 mg take 0.5 tablet (12.5 mg total) by mouth daily.                                              Labs: 06/28/2024 Creatinine 1.32, BUN 21, Potassium 4.2, Sodium 141, GFR 51 02/18/2024 Creatinine 1.46, BUN 30, Potassium 4.7, Sodium 142 11/05/2023 Creatinine 1.28, BUN 36, Potassium 4.7, Sodium 141 09/21/2023 Creatinine 1.60, BUN 28, Potassium 4.6, Sodium 135, GFR 40  A complete set of results can be found in Results Review.   Recommendations:  Left voice mail with ICM number and encouraged to call if experiencing any fluid symptoms.   Follow-up plan: ICM clinic phone appointment on 11/03/2024.   91 day device clinic remote transmission 11/01/2024.     EP/Cardiology Office Visits:   Recall 10/08/2024 with Dr. Cindie or APP.    Recall 08/19/2025 with Dr Lavona.     Copy of ICM check sent to Dr. Cindie.  Remote monitoring is medically necessary for Heart Failure Management.    Daily Thoracic Impedance ICM trend:  07/05/2024 through 10/03/2024.    12-14 Month Thoracic Impedance ICM trend:     [name], RN 10/04/2024 2:42 PM

## 2024-10-04 NOTE — Telephone Encounter (Signed)
 Remote ICM transmission received.  Attempted call to patient regarding ICM remote transmission.  Left detailed message per DPR with ICM phone number to return call for any questions, concerns or fluid symptoms.

## 2024-10-31 DIAGNOSIS — H401111 Primary open-angle glaucoma, right eye, mild stage: Secondary | ICD-10-CM | POA: Diagnosis not present

## 2024-10-31 DIAGNOSIS — H40012 Open angle with borderline findings, low risk, left eye: Secondary | ICD-10-CM | POA: Diagnosis not present

## 2024-11-01 ENCOUNTER — Ambulatory Visit (INDEPENDENT_AMBULATORY_CARE_PROVIDER_SITE_OTHER): Payer: TRICARE For Life (TFL)

## 2024-11-01 DIAGNOSIS — I5042 Chronic combined systolic (congestive) and diastolic (congestive) heart failure: Secondary | ICD-10-CM | POA: Diagnosis not present

## 2024-11-03 ENCOUNTER — Ambulatory Visit: Admitting: Infectious Diseases

## 2024-11-03 ENCOUNTER — Other Ambulatory Visit: Payer: Self-pay

## 2024-11-03 ENCOUNTER — Ambulatory Visit: Attending: Cardiovascular Disease

## 2024-11-03 VITALS — BP 119/73 | HR 81 | Temp 98.4°F | Wt 154.0 lb

## 2024-11-03 DIAGNOSIS — I10 Essential (primary) hypertension: Secondary | ICD-10-CM

## 2024-11-03 DIAGNOSIS — T827XXD Infection and inflammatory reaction due to other cardiac and vascular devices, implants and grafts, subsequent encounter: Secondary | ICD-10-CM

## 2024-11-03 DIAGNOSIS — I4819 Other persistent atrial fibrillation: Secondary | ICD-10-CM | POA: Diagnosis not present

## 2024-11-03 DIAGNOSIS — B952 Enterococcus as the cause of diseases classified elsewhere: Secondary | ICD-10-CM | POA: Diagnosis not present

## 2024-11-03 DIAGNOSIS — I5042 Chronic combined systolic (congestive) and diastolic (congestive) heart failure: Secondary | ICD-10-CM

## 2024-11-03 DIAGNOSIS — I2510 Atherosclerotic heart disease of native coronary artery without angina pectoris: Secondary | ICD-10-CM | POA: Diagnosis not present

## 2024-11-03 DIAGNOSIS — Z95810 Presence of automatic (implantable) cardiac defibrillator: Secondary | ICD-10-CM | POA: Diagnosis not present

## 2024-11-03 DIAGNOSIS — R7401 Elevation of levels of liver transaminase levels: Secondary | ICD-10-CM

## 2024-11-03 DIAGNOSIS — R7881 Bacteremia: Secondary | ICD-10-CM

## 2024-11-03 DIAGNOSIS — I5030 Unspecified diastolic (congestive) heart failure: Secondary | ICD-10-CM

## 2024-11-03 DIAGNOSIS — Z5181 Encounter for therapeutic drug level monitoring: Secondary | ICD-10-CM | POA: Diagnosis not present

## 2024-11-03 DIAGNOSIS — E785 Hyperlipidemia, unspecified: Secondary | ICD-10-CM | POA: Diagnosis not present

## 2024-11-03 DIAGNOSIS — A491 Streptococcal infection, unspecified site: Secondary | ICD-10-CM

## 2024-11-03 DIAGNOSIS — Z7185 Encounter for immunization safety counseling: Secondary | ICD-10-CM

## 2024-11-03 LAB — CUP PACEART REMOTE DEVICE CHECK
Battery Remaining Longevity: 68 mo
Battery Remaining Percentage: 71 %
Battery Voltage: 2.99 V
Date Time Interrogation Session: 20251118040016
Implantable Lead Connection Status: 753985
Implantable Lead Connection Status: 753985
Implantable Lead Implant Date: 20230210
Implantable Lead Implant Date: 20230210
Implantable Lead Location: 753858
Implantable Lead Location: 753860
Implantable Pulse Generator Implant Date: 20230210
Lead Channel Impedance Value: 440 Ohm
Lead Channel Impedance Value: 510 Ohm
Lead Channel Pacing Threshold Amplitude: 0.5 V
Lead Channel Pacing Threshold Amplitude: 1 V
Lead Channel Pacing Threshold Pulse Width: 0.5 ms
Lead Channel Pacing Threshold Pulse Width: 0.5 ms
Lead Channel Sensing Intrinsic Amplitude: 12 mV
Lead Channel Setting Pacing Amplitude: 2.25 V
Lead Channel Setting Pacing Amplitude: 2.5 V
Lead Channel Setting Pacing Pulse Width: 0.5 ms
Lead Channel Setting Pacing Pulse Width: 0.5 ms
Lead Channel Setting Sensing Sensitivity: 4 mV
Pulse Gen Model: 3562
Pulse Gen Serial Number: 3992207

## 2024-11-03 MED ORDER — AMOXICILLIN 500 MG PO CAPS
500.0000 mg | ORAL_CAPSULE | Freq: Three times a day (TID) | ORAL | 3 refills | Status: AC
Start: 2024-11-03 — End: ?

## 2024-11-03 NOTE — Progress Notes (Signed)
 EPIC Encounter for ICM Monitoring  Patient Name: Kevin Collins is a 89 y.o. male Date: 11/03/2024 Primary Care Physican: Orpha Yancey LABOR, MD Primary Cardiologist: Hochrein Electrophysiologist: Mealor Bi-V Pacing: 94%            05/14/2023 Weight: 155 lbs  12/10/2023 Weight: 160 lbs       05/31/2024 Weight: 155.6 lbs        08/23/2024 Weight: 154.6 lbs           11/03/2024 Weight: 154-156 lbs          Spoke with patient and heart failure questions reviewed.  Transmission results reviewed.  Pt occasionally has slight swelling around sock line.      Diet:  He drinks a lot of ice water.   He does not eat restaurant foods.  No changes in diet.   Son does all the cooking.    Since 10/03/2024 ICM Remote Transmission: CorVue thoracic impedance suggesting normal fluid levels with the exception of possible fluid accumulation 10/15/2024-10/23/2024.   Prescribed:  Furosemide  40 mg take 0.5 tablet(s) (20 mg total) by mouth daily (started 10/11). May take an additional 20 mg for weight gain over 2 lbs in 24 hours  Potassium 20 mEq take 1 tablet by mouth daily Spironolactone  25 mg take 0.5 tablet (12.5 mg total) by mouth daily.                                              Labs: 11/03/2024 BMET results pending 06/28/2024 Creatinine 1.32, BUN 21, Potassium 4.2, Sodium 141, GFR 51 02/18/2024 Creatinine 1.46, BUN 30, Potassium 4.7, Sodium 142 11/05/2023 Creatinine 1.28, BUN 36, Potassium 4.7, Sodium 141 09/21/2023 Creatinine 1.60, BUN 28, Potassium 4.6, Sodium 135, GFR 40  A complete set of results can be found in Results Review.   Recommendations:  No changes and encouraged to call if experiencing any fluid symptoms.   Follow-up plan: ICM clinic phone appointment on 12/12/2024.   91 day device clinic remote transmission 02/01/2024.     EP/Cardiology Office Visits:   Provided EP scheduling office number and encouraged to call for appointment.  Recall 10/08/2024 with EP APP (yearly).    Recall 08/19/2025 with  Dr Lavona.     Copy of ICM check sent to Dr. Nancey.    Remote monitoring is medically necessary for Heart Failure Management.    Daily Thoracic Impedance ICM trend: 08/05/2024 through 11/03/2024.    12-14 Month Thoracic Impedance ICM trend:     [name], RN 11/03/2024 8:08 AM

## 2024-11-03 NOTE — Progress Notes (Signed)
 Patient Active Problem List   Diagnosis Date Noted   Medication monitoring encounter 11/03/2024   Immunization counseling 11/03/2024   Enterococcal infection 02/20/2024   Callus of foot 02/20/2024   Pacemaker infection 09/25/2023   Fall 09/25/2023   Bacteremia due to Enterococcus 09/25/2023   Transaminitis 09/10/2023   AKI (acute kidney injury) 09/10/2023   CHF (congestive heart failure) (HCC) 09/09/2023   Pain in left foot 08/21/2023   Peripheral neuropathy 08/19/2023   Paroxysmal atrial fibrillation (HCC) 08/19/2023   Sepsis due to undetermined organism (HCC) 08/18/2023   Symptomatic bradycardia 01/24/2022   Bradycardia 05/07/2020   Nonrheumatic aortic valve insufficiency 05/07/2020   Acute on chronic combined systolic and diastolic HF (heart failure) (HCC) 05/07/2020   Alcoholic cardiomyopathy (HCC) 95/84/7978   Acute heart failure (HCC) 08/31/2019   Medication management 08/31/2019   Atrial fibrillation (HCC) 08/31/2019   Coronary artery disease involving native coronary artery of native heart without angina pectoris 05/25/2019   Essential hypertension 05/25/2019   Dyslipidemia 05/25/2019   Educated about COVID-19 virus infection 05/25/2019    Patient's Medications  New Prescriptions   No medications on file  Previous Medications   AMOXICILLIN  (AMOXIL ) 500 MG CAPSULE    Take 1 capsule (500 mg total) by mouth 3 (three) times daily.   APIXABAN  (ELIQUIS ) 2.5 MG TABS TABLET    TAKE 1 TABLET TWICE A DAY   BIMATOPROST (LUMIGAN) 0.01 % SOLN    Place 1 drop into both eyes at bedtime.   EZETIMIBE  (ZETIA ) 10 MG TABLET    Take 1 tablet (10 mg total) by mouth daily.   FUROSEMIDE  (LASIX ) 20 MG TABLET    Take 1 tablet (20 mg total) by mouth daily. May take an additional 20 mg for weigh gain over 2 lbs in 24 hours   GABAPENTIN  (NEURONTIN ) 800 MG TABLET    Take 800 mg by mouth 3 (three) times daily.   LISINOPRIL  (ZESTRIL ) 10 MG TABLET    Take 10 mg by mouth daily.   MULTIPLE  VITAMINS-MINERALS (PRESERVISION AREDS) CAPS    Take 1 capsule by mouth 2 (two) times daily.   OXYCODONE -ACETAMINOPHEN  (PERCOCET) 7.5-325 MG TABLET    Take 1 tablet by mouth every 8 (eight) hours as needed for severe pain.   SIMVASTATIN  (ZOCOR ) 40 MG TABLET    Take 1 tablet (40 mg total) by mouth daily.  Modified Medications   No medications on file  Discontinued Medications   No medications on file    Subjective: 89 year old male w PMH of CAD s/p CABG, HTN, dyslipidemia, CKD, peripheral neuropathy, s/p biventricular pacemaker 01/24/2022 who is here for HFU for E faecalis bacteremia, presumptive endocarditis  and CIED infection. Patient was discharged 9/7 with plan to complete 6 weeks of IV ampicillin  and ceftriaxone , EOT 10/16 to be followed by PO amoxicillin  suppression indefinitely.   Patient was seen in the ED 9/18 for fluid overload and left AMA, then admitted 9/25-9/30 for volume overload/acute CHFrEF. He was managed with diuretics, rt sided thoracentesis. TTE with EF 35-40% and discharged after improvement. He was seen by Cardiology for fu on 10/7  IV abtx completed 10/16 after which started on po amoxicillin  10/17.  7/15  Reports taking PO amoxicillin  three times a day. Follows PCP for calluses of his feet on and off but denies any wounds currently. PCP trims foot calluses as needed. He is also undergoing injections for macular degeneration in his eyes. Last seen by Cardiology on 3/12. Has an upcoming  appt with PCP, Opthalmologist and then Cardiology. No other complaints.  11/03/24 Accompanied by son, reports compliance with PO amoxicillin  3 times daily with no missed doses or concerns.  He has been following with cardiology.  Needs refills.  Does not want flu vaccine.  No other concerns.   Review of Systems: All systems reviewed with pertinent positives and negatives as listed above  Past Medical History:  Diagnosis Date   Bacteremia 08/2023   Bradycardia, unspecified    CAD  (coronary artery disease)    a. 2000 s/p CABG x 4 (LIMA->LAD, VG->OM, VG->Diag, VG->RPDA/RPL.   Carotid arterial disease    a. 12/2019 U/S: RICA 1-39% bilat.   Chronic HFrEF (heart failure with reduced ejection fraction) (HCC)    a. 08/2023 Echo: EF 35-40%, glob HK, GrIII DD, mildly reduced RV fxn, sev BAE, mod MR, mild-mod TR.   CKD (chronic kidney disease), stage III (HCC)    Hereditary and idiopathic neuropathy, unspecified    History of shingles    Hyperlipidemia    Hypertension    ESSENTIAL PRIMARY   NICM (nonischemic cardiomyopathy) (HCC)    a. 01/2022 s/p SJM 3562, quadra Allure MP CRT-P (LV/RV leads, no RA); b. 08/2023 Echo: EF 35-40%.   Peripheral neuropathy    Persistent atrial fibrillation (HCC)    a. CHA2DS2VASc = 5-->eliquis .   Past Surgical History:  Procedure Laterality Date   BIV PACEMAKER INSERTION CRT-P N/A 01/24/2022   Procedure: BIV PACEMAKER INSERTION CRT-P;  Surgeon: Cindie Ole DASEN, MD;  Location: [facility];  Service: Cardiovascular;  Laterality: N/A;   CARDIAC CATHETERIZATION Left 09/1999   NORMAL LEFT MAIN, OCCLUDED LAD, OCCLUDED MID CFX, OCCLUDED PROXIMAL RCA, 60% STENOSIS PROXIMAL DIAG 1, RIGHT TO LEFT COLLATERAL, LEFT TO RIGHT COLLATERAL; LVEF OF 48% DOCUMENTED VIA NUCLEAR STUDY ON 07/12/2009.   CARDIOVERSION N/A 10/05/2019   Procedure: CARDIOVERSION;  Surgeon: Francyne Headland, MD;  Location: [facility] ENDOSCOPY;  Service: Cardiovascular;  Laterality: N/A;   CATARACT EXTRACTION     CORONARY ARTERY BYPASS GRAFT     w/ LIMA TO LAD, SVG TO dx, SVG TO OM, SVG TO AM-PD-PL 10/12/99 [facility]   REMOVAL OF ANKLE PLATE      Social History   Tobacco Use   Smoking status: Former   Smokeless tobacco: Never  Vaping Use   Vaping status: Never Used  Substance Use Topics   Alcohol use: Never   Drug use: Never    Family History  Problem Relation Age of Onset   Other Mother        MALIGNANT TUMOR OF OVARY   CVA Father     No Known Allergies  Health  Maintenance  Topic Date Due   DTaP/Tdap/Td (1 - Tdap) Never done   Pneumococcal Vaccine: 50+ Years (1 of 2 - PCV) Never done   Zoster Vaccines- Shingrix (1 of 2) Never done   COVID-19 Vaccine (3 - Moderna risk series) 04/24/2020   Influenza Vaccine  Never done   Medicare Annual Wellness (AWV)  01/11/2025   Meningococcal B Vaccine  Aged Out    Objective: BP 119/73   Pulse 81   Temp 98.4 F (36.9 C) (Oral)   Wt 154 lb (69.9 kg)   SpO2 96%   BMI 22.10 kg/m    Physical Exam Constitutional:      Appearance: Normal appearance.  HENT:     Head: Normocephalic and atraumatic.      Mouth: Mucous membranes are moist.  Eyes:    Conjunctiva/sclera:  Conjunctivae normal.     Pupils: Pupils are equal, round, and bilaterally symmetrical   Cardiovascular:     Rate and Rhythm: Normal rate     Heart sounds:    Pulmonary:     Effort: Pulmonary effort is normal.     Breath sounds:   Abdominal:     General: Non distended     Palpations:   Musculoskeletal:        General: Normal range of motion. Ambulatory  Skin:    General: Skin is warm and dry.     Comments: left PPM pocket OK  Neurological:     General: grossly non focal     Mental Status: awake, alert and oriented to person, place, and time. Ambulatory, hard of hearing  Psychiatric:        Mood and Affect: Mood normal.   Lab Results Lab Results  Component Value Date   WBC 6.2 06/28/2024   HGB 11.5 (L) 06/28/2024   HCT 36.0 (L) 06/28/2024   MCV 94.5 06/28/2024   PLT 144 06/28/2024    Lab Results  Component Value Date   CREATININE 1.32 (H) 06/28/2024   BUN 21 06/28/2024   NA 141 06/28/2024   K 4.2 06/28/2024   CL 104 06/28/2024   CO2 32 06/28/2024    Lab Results  Component Value Date   ALT 189 (H) 09/13/2023   AST 114 (H) 09/13/2023   ALKPHOS 43 09/13/2023   BILITOT 0.5 09/13/2023    No results found for: CHOL, HDL, LDLCALC, LDLDIRECT, TRIG, CHOLHDL No results found for: LABRPR,  RPRTITER No results found for: HIV1RNAQUANT, HIV1RNAVL, CD4TABS   Assessment/Plan 89 year old male w PMH of CAD s/p CABG, HTN, dyslipidemia, peripheral neuropathy, s/p biventricular pacemaker 01/24/2022 who presented to [facility] ED 9/3 with acute onset  fevers chills and generalized weakness for 1 day. Admitted with    # E faecalis bacteremia, presumptive endocarditis as well as CIED infection - s/p 6 weeks of IV ceftriaxone  + IV ampicillin . EOT 09/30/23. Started PO amoxicillin  10/17 till date - Adherence assessed, side effects reviewed/discussed and DDIs reviewed  - 7/15 labs reviewed, Cr 1.32  Plan - continue PO Amoxicillin  500 mg po tid, to be continued indefinitely.  Refill sent. - Labs today  - Fu in 4 months   - Fu with EP as instructed   # Elevated Cr/Possible CKD - CMP today, follow-up with PCP  # CHFrEF/CAD/Persistent A fib/HTN/HLD - on medications - fu with Cardiology as instructed, last seen on 08/24/2024  # Immunization counseling - Declined flu vaccine  I spent 30 minutes involved in face-to-face and non-face-to-face activities for this patient on the day of the visit. Professional time spent includes the following activities: Preparing to see the patient (review of tests), Obtaining and  reviewing separately obtained history ( Cardiology notes on 08/24/2024), Performing a medically appropriate examination and evaluation , Ordering lab work, medications, documenting clinical information in the EMR, Independently interpreting results (not separately reported), Communicating results to the patient/son, Counseling and educating the patient/son.    Annalee Joseph, MD [facility] for Infectious Disease [facility] 11/03/2024, 8:37 AM

## 2024-11-03 NOTE — Progress Notes (Signed)
 Remote PPM Transmission

## 2024-11-04 LAB — COMPREHENSIVE METABOLIC PANEL WITH GFR
AG Ratio: 2.2 (calc) (ref 1.0–2.5)
ALT: 28 U/L (ref 9–46)
AST: 30 U/L (ref 10–35)
Albumin: 4.6 g/dL (ref 3.6–5.1)
Alkaline phosphatase (APISO): 50 U/L (ref 35–144)
BUN/Creatinine Ratio: 13 (calc) (ref 6–22)
BUN: 22 mg/dL (ref 7–25)
CO2: 34 mmol/L — ABNORMAL HIGH (ref 20–32)
Calcium: 10.8 mg/dL — ABNORMAL HIGH (ref 8.6–10.3)
Chloride: 101 mmol/L (ref 98–110)
Creat: 1.66 mg/dL — ABNORMAL HIGH (ref 0.70–1.22)
Globulin: 2.1 g/dL (ref 1.9–3.7)
Glucose, Bld: 94 mg/dL (ref 65–99)
Potassium: 4.2 mmol/L (ref 3.5–5.3)
Sodium: 141 mmol/L (ref 135–146)
Total Bilirubin: 0.6 mg/dL (ref 0.2–1.2)
Total Protein: 6.7 g/dL (ref 6.1–8.1)
eGFR: 38 mL/min/1.73m2 — ABNORMAL LOW (ref >=60)

## 2024-11-04 LAB — CBC
HCT: 39.6 % (ref 38.5–50.0)
Hemoglobin: 12.7 g/dL — ABNORMAL LOW (ref 13.2–17.1)
MCH: 30.6 pg (ref 27.0–33.0)
MCHC: 32.1 g/dL (ref 32.0–36.0)
MCV: 95.4 fL (ref 80.0–100.0)
MPV: 11.4 fL (ref 7.5–12.5)
Platelets: 120 Thousand/uL — ABNORMAL LOW (ref 140–400)
RBC: 4.15 Million/uL — ABNORMAL LOW (ref 4.20–5.80)
RDW: 11.1 % (ref 11.0–15.0)
WBC: 5.9 Thousand/uL (ref 3.8–10.8)

## 2024-11-05 ENCOUNTER — Ambulatory Visit: Payer: Self-pay | Admitting: Infectious Diseases

## 2024-11-14 ENCOUNTER — Ambulatory Visit: Payer: Self-pay | Admitting: Cardiovascular Disease

## 2024-11-15 DIAGNOSIS — H353133 Nonexudative age-related macular degeneration, bilateral, advanced atrophic without subfoveal involvement: Secondary | ICD-10-CM | POA: Diagnosis not present

## 2024-12-10 NOTE — ED Provider Notes (Signed)
 Emergency Department Provider Note    ED Clinical Impression   Final diagnoses:  Influenza A (Primary)    ED Assessment/Plan    Condition: Stable Disposition: Discharge  This chart has been completed using Dragon Medical Dictation software, and while attempts have been made to ensure accuracy, certain words and phrases may not be transcribed as intended.   History   Chief Complaint  Patient presents with   Flu Like Symptoms   HPI  Kevin Collins is a 89 y.o. male  who presents today to the  emergency department complaining of URI and flulike symptoms.  Patient reports a household full of people who have had recent flulike symptoms.  But he states that he does have 2 sons and he lives privately with them.  Reports at home he felt generally weak with difficulty standing.  This is resolved feels this might be secondary to a URI plus his longstanding diagnosis of neuropathy.  He is maxed out on gabapentin  800 mg.  Does take oxycodone  but states that he is try to wean himself off of this.  Reports a mild cough and subjective fever and chills.  No nausea and vomiting.  He is that he feels improved now that he is here in the emergency department.  No abdominal pain.  Focal weakness.  Allergies: has no known allergies. Medications: has a current medication list which includes the following long-term medication(s): albuterol, furosemide , gabapentin , lisinopril , simvastatin , and spironolactone . PMHx:  has no past medical history on file. PSHx:  has no past surgical history on file. SocHx:  reports that he has never smoked. He has never used smokeless tobacco. Allergies, Medications, Medical, Surgical, and Social History were reviewed as documented above.  Social Drivers of Health with Concerns   Tobacco Use: Medium Risk (08/24/2024)   Received from [facility]   Patient History    Smoking Tobacco Use: Former    Smokeless Tobacco Use: Never    Passive Exposure: Not on file   Alcohol Use: Not on file  Housing: Not on file  Physical Activity: Not on file  Stress: Not on file  Interpersonal Safety: Not on file  Substance Use: Not on file (10/21/2023)  Social Connections: Not on file  Financial Resource Strain: Not on file  Health Literacy: Not on file  Internet Connectivity: Not on file    Review Of Systems  Review of Systems  Constitutional:  Negative for chills and fever.  HENT:  Negative for ear pain.   Respiratory:  Negative for chest tightness and shortness of breath.   Cardiovascular:  Negative for chest pain.  Gastrointestinal:  Negative for abdominal pain.  Genitourinary:  Negative for flank pain.  Musculoskeletal:  Negative for back pain.  Skin:  Negative for rash and wound.  Neurological:  Negative for weakness.  Psychiatric/Behavioral:  Negative for suicidal ideas.   All other systems reviewed and are negative.   Physical Exam   BP 99/49   Pulse 59   Temp (!) 38.1 C (100.6 F) (Oral)   Resp 20   SpO2 95%   Physical Exam Vitals and nursing note reviewed.  Constitutional:      Appearance: Normal appearance. He is not ill-appearing, toxic-appearing or diaphoretic.     Comments: Well-appearing, conversant.  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  HENT:     Head: Normocephalic.     Right Ear: Tympanic membrane normal.     Left Ear: Tympanic membrane normal.     Nose: Nose  normal.     Mouth/Throat:     Mouth: Mucous membranes are moist.  Eyes:     Extraocular Movements: Extraocular movements intact.     Conjunctiva/sclera: Conjunctivae normal.     Pupils: Pupils are equal, round, and reactive to light.  Cardiovascular:     Rate and Rhythm: Normal rate and regular rhythm.     Pulses: Normal pulses.     Heart sounds: Normal heart sounds.  Pulmonary:     Effort: Pulmonary effort is normal.     Breath sounds: Normal breath sounds.  Abdominal:     General: There is no distension.     Palpations: Abdomen is soft.      Tenderness: There is no right CVA tenderness or left CVA tenderness.  Musculoskeletal:        General: No deformity. Normal range of motion.     Cervical back: Neck supple.     Comments: 5 out of 5 strength in the lower extremities bilaterally.  No pitting edema.  Skin:    General: Skin is warm and dry.     Capillary Refill: Capillary refill takes less than 2 seconds.  Neurological:     Mental Status: He is alert and oriented to person, place, and time. Mental status is at baseline.     Gait: Gait normal.  Psychiatric:        Mood and Affect: Mood normal.        Behavior: Behavior normal.        Thought Content: Thought content normal.        Judgment: Judgment normal.     ED Course  Medical Decision Making DDx includes.  Pneumonia, electrolyte deficiency, medication side effect, CVA, TIA, ACS, AMI, PE, COPD Exacerbation, Aneurysm, Pneumonia, Pneumothorax, Pyelonephritis, Chest wall strain, Aortic Aneuyrysm, Arrythmia.     Patient is influenza A positive. Chest x-ray is negative for pneumonia. P.o. Tylenol  and steroid to help with symptomatology. Patient is weightbearing and improved here.  Albuterol MDI as needed for cough plan is for discharge home  Amount and/or Complexity of Data Reviewed Radiology: ordered.  Risk OTC drugs. Prescription drug management.     Procedures   No results found for this visit on 12/10/24 (from the past 4464 hours).        Alla Cleola Coffin, MD 12/10/24 [phone]

## 2024-12-12 ENCOUNTER — Ambulatory Visit: Attending: Cardiovascular Disease

## 2024-12-12 DIAGNOSIS — I5042 Chronic combined systolic (congestive) and diastolic (congestive) heart failure: Secondary | ICD-10-CM

## 2024-12-12 DIAGNOSIS — Z95810 Presence of automatic (implantable) cardiac defibrillator: Secondary | ICD-10-CM | POA: Diagnosis not present

## 2024-12-12 NOTE — Progress Notes (Signed)
 EPIC Encounter for ICM Monitoring  Patient Name: Kevin Collins is a 89 y.o. male Date: 12/12/2024 Primary Care Physican: Kevin Collins LABOR, MD Primary Cardiologist: Kevin Collins Electrophysiologist: Kevin Collins Bi-V Pacing: 94%            05/14/2023 Weight: 155 lbs  12/10/2023 Weight: 160 lbs       05/31/2024 Weight: 155.6 lbs        08/23/2024 Weight: 154.6 lbs           11/03/2024 Weight: 154-156 lbs          Spoke with patient and heart failure questions reviewed.  Transmission results reviewed.  Pt currently not feeling well due to having the flu.    ED on 12/10/2024 for flu.  He is having trouble logging into [employer].  Advised to call mychart help desk for assistance.     Diet:  He drinks a lot of ice water.   He does not eat restaurant foods.  No changes in diet.   Son does all the cooking.    Since 11/03/2024 ICM Remote Transmission: [employer] thoracic impedance suggesting normal fluid levels.   Prescribed:  Furosemide  40 mg take 0.5 tablet(s) (20 mg total) by mouth daily (started 10/11). May take an additional 20 mg for weight gain over 2 lbs in 24 hours                                  Labs: 11/03/2024 Creatinine 1.66, BUN 22, Potassium 4.2, Sodium 141, GFR 38 06/28/2024 Creatinine 1.32, BUN 21, Potassium 4.2, Sodium 141, GFR 51 A complete set of results can be found in Results Review.   Recommendations:  No changes and encouraged to call if experiencing any fluid symptoms.   Follow-up plan: ICM clinic phone appointment on 01/16/2025.   91 day device clinic remote transmission 02/01/2024.     EP/Cardiology Office Visits:   Aware to call for yearly EP appointment.  Recall 10/08/2024 with EP APP (yearly).    Recall 08/19/2025 with Kevin Collins.     Copy of ICM check sent to Kevin Collins.    Remote monitoring is medically necessary for Heart Failure Management.    Daily Thoracic Impedance ICM trend: 09/13/2024 through 12/12/2024.    12-14 Month Thoracic Impedance ICM trend:     Kevin GORMAN Garner,  RN 12/12/2024 7:57 AM

## 2025-01-02 ENCOUNTER — Other Ambulatory Visit: Payer: Self-pay | Admitting: Student

## 2025-01-12 NOTE — Progress Notes (Signed)
 31 day ICM Remote transmission canceled due to [facility] clinic is on hold until further notice.  91 day remote monitoring will continue per protocol.

## 2025-01-16 ENCOUNTER — Ambulatory Visit

## 2025-02-28 ENCOUNTER — Ambulatory Visit: Admitting: Infectious Diseases
